# Patient Record
Sex: Male | Born: 1935 | Race: White | Hispanic: No | Marital: Married | Smoking: Former smoker
Health system: Southern US, Community
[De-identification: ages and names within clinical notes are randomized; demographics above are authoritative.]

## PROBLEM LIST (undated history)

## (undated) DIAGNOSIS — E663 Overweight: Secondary | ICD-10-CM

## (undated) DIAGNOSIS — I10 Essential (primary) hypertension: Secondary | ICD-10-CM

## (undated) DIAGNOSIS — R609 Edema, unspecified: Secondary | ICD-10-CM

## (undated) DIAGNOSIS — K449 Diaphragmatic hernia without obstruction or gangrene: Secondary | ICD-10-CM

## (undated) DIAGNOSIS — J449 Chronic obstructive pulmonary disease, unspecified: Secondary | ICD-10-CM

## (undated) DIAGNOSIS — C801 Malignant (primary) neoplasm, unspecified: Secondary | ICD-10-CM

## (undated) DIAGNOSIS — Z9989 Dependence on other enabling machines and devices: Secondary | ICD-10-CM

## (undated) DIAGNOSIS — G4733 Obstructive sleep apnea (adult) (pediatric): Secondary | ICD-10-CM

## (undated) DIAGNOSIS — I82409 Acute embolism and thrombosis of unspecified deep veins of unspecified lower extremity: Secondary | ICD-10-CM

## (undated) DIAGNOSIS — J4489 Other specified chronic obstructive pulmonary disease: Secondary | ICD-10-CM

## (undated) DIAGNOSIS — D751 Secondary polycythemia: Secondary | ICD-10-CM

## (undated) DIAGNOSIS — D126 Benign neoplasm of colon, unspecified: Secondary | ICD-10-CM

## (undated) DIAGNOSIS — Z87891 Personal history of nicotine dependence: Secondary | ICD-10-CM

## (undated) DIAGNOSIS — I739 Peripheral vascular disease, unspecified: Secondary | ICD-10-CM

## (undated) DIAGNOSIS — M1990 Unspecified osteoarthritis, unspecified site: Secondary | ICD-10-CM

## (undated) DIAGNOSIS — N289 Disorder of kidney and ureter, unspecified: Secondary | ICD-10-CM

## (undated) DIAGNOSIS — Z9981 Dependence on supplemental oxygen: Secondary | ICD-10-CM

## (undated) DIAGNOSIS — E785 Hyperlipidemia, unspecified: Secondary | ICD-10-CM

## (undated) DIAGNOSIS — K5730 Diverticulosis of large intestine without perforation or abscess without bleeding: Secondary | ICD-10-CM

## (undated) DIAGNOSIS — I2699 Other pulmonary embolism without acute cor pulmonale: Secondary | ICD-10-CM

## (undated) DIAGNOSIS — C689 Malignant neoplasm of urinary organ, unspecified: Secondary | ICD-10-CM

## (undated) DIAGNOSIS — I679 Cerebrovascular disease, unspecified: Secondary | ICD-10-CM

## (undated) HISTORY — DX: Diverticulosis of large intestine without perforation or abscess without bleeding: K57.30

## (undated) HISTORY — DX: Disorder of kidney and ureter, unspecified: N28.9

## (undated) HISTORY — DX: Other specified chronic obstructive pulmonary disease: J44.89

## (undated) HISTORY — DX: Diaphragmatic hernia without obstruction or gangrene: K44.9

## (undated) HISTORY — PX: HERNIA REPAIR: SHX51

## (undated) HISTORY — DX: Chronic obstructive pulmonary disease, unspecified: J44.9

## (undated) HISTORY — DX: Cerebrovascular disease, unspecified: I67.9

## (undated) HISTORY — DX: Malignant neoplasm of urinary organ, unspecified: C68.9

## (undated) HISTORY — DX: Benign neoplasm of colon, unspecified: D12.6

## (undated) HISTORY — PX: NEPHROURETERECTOMY: SUR876

## (undated) HISTORY — DX: Edema, unspecified: R60.9

## (undated) HISTORY — DX: Hyperlipidemia, unspecified: E78.5

## (undated) HISTORY — DX: Overweight: E66.3

## (undated) HISTORY — DX: Essential (primary) hypertension: I10

## (undated) HISTORY — PX: OTHER SURGICAL HISTORY: SHX169

## (undated) HISTORY — DX: Personal history of nicotine dependence: Z87.891

---

## 1998-10-31 ENCOUNTER — Ambulatory Visit (HOSPITAL_COMMUNITY): Admission: RE | Admit: 1998-10-31 | Discharge: 1998-10-31 | Payer: Self-pay | Admitting: Pulmonary Disease

## 1998-10-31 ENCOUNTER — Encounter: Payer: Self-pay | Admitting: Pulmonary Disease

## 2002-05-17 ENCOUNTER — Ambulatory Visit (HOSPITAL_COMMUNITY): Admission: RE | Admit: 2002-05-17 | Discharge: 2002-05-17 | Payer: Self-pay | Admitting: Pulmonary Disease

## 2002-05-17 ENCOUNTER — Encounter: Payer: Self-pay | Admitting: Pulmonary Disease

## 2002-06-01 ENCOUNTER — Emergency Department (HOSPITAL_COMMUNITY): Admission: EM | Admit: 2002-06-01 | Discharge: 2002-06-01 | Payer: Self-pay

## 2002-06-01 ENCOUNTER — Ambulatory Visit (HOSPITAL_BASED_OUTPATIENT_CLINIC_OR_DEPARTMENT_OTHER): Admission: RE | Admit: 2002-06-01 | Discharge: 2002-06-01 | Payer: Self-pay | Admitting: Urology

## 2002-06-01 ENCOUNTER — Encounter (INDEPENDENT_AMBULATORY_CARE_PROVIDER_SITE_OTHER): Payer: Self-pay | Admitting: Specialist

## 2002-06-02 ENCOUNTER — Emergency Department (HOSPITAL_COMMUNITY): Admission: EM | Admit: 2002-06-02 | Discharge: 2002-06-02 | Payer: Self-pay | Admitting: *Deleted

## 2002-06-06 ENCOUNTER — Encounter: Admission: RE | Admit: 2002-06-06 | Discharge: 2002-06-06 | Payer: Self-pay | Admitting: Urology

## 2002-06-06 ENCOUNTER — Encounter: Payer: Self-pay | Admitting: Urology

## 2002-06-22 ENCOUNTER — Emergency Department (HOSPITAL_COMMUNITY): Admission: EM | Admit: 2002-06-22 | Discharge: 2002-06-22 | Payer: Self-pay | Admitting: Emergency Medicine

## 2002-06-22 ENCOUNTER — Encounter: Payer: Self-pay | Admitting: Emergency Medicine

## 2002-07-02 ENCOUNTER — Inpatient Hospital Stay (HOSPITAL_COMMUNITY): Admission: RE | Admit: 2002-07-02 | Discharge: 2002-07-06 | Payer: Self-pay | Admitting: Urology

## 2002-07-02 ENCOUNTER — Encounter (INDEPENDENT_AMBULATORY_CARE_PROVIDER_SITE_OTHER): Payer: Self-pay | Admitting: Specialist

## 2002-07-03 ENCOUNTER — Encounter: Payer: Self-pay | Admitting: Urology

## 2003-04-05 ENCOUNTER — Inpatient Hospital Stay (HOSPITAL_COMMUNITY): Admission: RE | Admit: 2003-04-05 | Discharge: 2003-04-08 | Payer: Self-pay | Admitting: General Surgery

## 2004-05-28 ENCOUNTER — Ambulatory Visit (HOSPITAL_COMMUNITY): Admission: RE | Admit: 2004-05-28 | Discharge: 2004-05-28 | Payer: Self-pay | Admitting: Pulmonary Disease

## 2004-08-12 ENCOUNTER — Ambulatory Visit: Payer: Self-pay | Admitting: Pulmonary Disease

## 2004-09-30 ENCOUNTER — Ambulatory Visit: Payer: Self-pay | Admitting: Pulmonary Disease

## 2004-11-25 ENCOUNTER — Ambulatory Visit: Payer: Self-pay | Admitting: Pulmonary Disease

## 2004-12-04 ENCOUNTER — Ambulatory Visit: Payer: Self-pay | Admitting: Pulmonary Disease

## 2004-12-04 ENCOUNTER — Ambulatory Visit (HOSPITAL_BASED_OUTPATIENT_CLINIC_OR_DEPARTMENT_OTHER): Admission: RE | Admit: 2004-12-04 | Discharge: 2004-12-04 | Payer: Self-pay | Admitting: Pulmonary Disease

## 2005-01-13 ENCOUNTER — Ambulatory Visit: Payer: Self-pay | Admitting: Pulmonary Disease

## 2005-01-15 ENCOUNTER — Ambulatory Visit: Payer: Self-pay | Admitting: Pulmonary Disease

## 2005-01-15 ENCOUNTER — Emergency Department (HOSPITAL_COMMUNITY): Admission: EM | Admit: 2005-01-15 | Discharge: 2005-01-15 | Payer: Self-pay | Admitting: Emergency Medicine

## 2005-02-11 ENCOUNTER — Ambulatory Visit: Payer: Self-pay | Admitting: Pulmonary Disease

## 2005-03-12 ENCOUNTER — Ambulatory Visit: Payer: Self-pay | Admitting: Pulmonary Disease

## 2005-03-23 ENCOUNTER — Ambulatory Visit: Payer: Self-pay | Admitting: Pulmonary Disease

## 2005-07-14 ENCOUNTER — Ambulatory Visit: Payer: Self-pay | Admitting: Pulmonary Disease

## 2005-07-31 ENCOUNTER — Emergency Department (HOSPITAL_COMMUNITY): Admission: EM | Admit: 2005-07-31 | Discharge: 2005-07-31 | Payer: Self-pay | Admitting: Emergency Medicine

## 2005-08-02 ENCOUNTER — Ambulatory Visit: Payer: Self-pay | Admitting: Pulmonary Disease

## 2005-08-04 ENCOUNTER — Ambulatory Visit (HOSPITAL_COMMUNITY): Admission: RE | Admit: 2005-08-04 | Discharge: 2005-08-04 | Payer: Self-pay | Admitting: Pulmonary Disease

## 2005-08-30 ENCOUNTER — Ambulatory Visit: Payer: Self-pay | Admitting: Pulmonary Disease

## 2005-09-24 ENCOUNTER — Emergency Department (HOSPITAL_COMMUNITY): Admission: EM | Admit: 2005-09-24 | Discharge: 2005-09-24 | Payer: Self-pay | Admitting: Emergency Medicine

## 2005-09-28 ENCOUNTER — Ambulatory Visit: Payer: Self-pay | Admitting: Pulmonary Disease

## 2005-11-30 ENCOUNTER — Ambulatory Visit: Payer: Self-pay | Admitting: Pulmonary Disease

## 2005-12-27 ENCOUNTER — Ambulatory Visit: Payer: Self-pay | Admitting: Pulmonary Disease

## 2006-02-28 ENCOUNTER — Ambulatory Visit: Payer: Self-pay | Admitting: Gastroenterology

## 2006-04-05 ENCOUNTER — Ambulatory Visit: Payer: Self-pay | Admitting: Pulmonary Disease

## 2006-04-12 ENCOUNTER — Ambulatory Visit: Payer: Self-pay | Admitting: Gastroenterology

## 2006-08-08 ENCOUNTER — Ambulatory Visit: Payer: Self-pay | Admitting: Pulmonary Disease

## 2006-11-14 ENCOUNTER — Ambulatory Visit: Payer: Self-pay | Admitting: Pulmonary Disease

## 2007-03-13 ENCOUNTER — Ambulatory Visit: Payer: Self-pay | Admitting: Pulmonary Disease

## 2007-07-11 ENCOUNTER — Ambulatory Visit: Payer: Self-pay | Admitting: Pulmonary Disease

## 2007-08-25 DIAGNOSIS — J449 Chronic obstructive pulmonary disease, unspecified: Secondary | ICD-10-CM

## 2007-08-25 DIAGNOSIS — K449 Diaphragmatic hernia without obstruction or gangrene: Secondary | ICD-10-CM | POA: Insufficient documentation

## 2007-08-25 DIAGNOSIS — J4489 Other specified chronic obstructive pulmonary disease: Secondary | ICD-10-CM | POA: Insufficient documentation

## 2007-08-25 DIAGNOSIS — M1990 Unspecified osteoarthritis, unspecified site: Secondary | ICD-10-CM | POA: Insufficient documentation

## 2007-08-25 DIAGNOSIS — I10 Essential (primary) hypertension: Secondary | ICD-10-CM | POA: Insufficient documentation

## 2007-08-25 DIAGNOSIS — E785 Hyperlipidemia, unspecified: Secondary | ICD-10-CM | POA: Insufficient documentation

## 2008-01-10 ENCOUNTER — Ambulatory Visit: Payer: Self-pay | Admitting: Pulmonary Disease

## 2008-01-21 LAB — CONVERTED CEMR LAB
ALT: 28 units/L (ref 0–53)
AST: 30 units/L (ref 0–37)
Albumin: 4 g/dL (ref 3.5–5.2)
Alkaline Phosphatase: 56 units/L (ref 39–117)
BUN: 22 mg/dL (ref 6–23)
Basophils Absolute: 0 10*3/uL (ref 0.0–0.1)
Basophils Relative: 0.3 % (ref 0.0–1.0)
Bilirubin, Direct: 0.2 mg/dL (ref 0.0–0.3)
CO2: 29 meq/L (ref 19–32)
Calcium: 9 mg/dL (ref 8.4–10.5)
Chloride: 103 meq/L (ref 96–112)
Cholesterol: 116 mg/dL (ref 0–200)
Creatinine, Ser: 1.4 mg/dL (ref 0.4–1.5)
Eosinophils Absolute: 0.3 10*3/uL (ref 0.0–0.7)
Eosinophils Relative: 4.3 % (ref 0.0–5.0)
GFR calc Af Amer: 63 mL/min
GFR calc non Af Amer: 52 mL/min
Glucose, Bld: 95 mg/dL (ref 70–99)
HCT: 49.5 % (ref 39.0–52.0)
HDL: 35.5 mg/dL — ABNORMAL LOW (ref >39.0)
Hemoglobin: 16.4 g/dL (ref 13.0–17.0)
LDL Cholesterol: 44 mg/dL (ref 0–99)
Lymphocytes Relative: 17.3 % (ref 12.0–46.0)
MCHC: 33.2 g/dL (ref 30.0–36.0)
MCV: 93 fL (ref 78.0–100.0)
Monocytes Absolute: 0.8 10*3/uL (ref 0.1–1.0)
Monocytes Relative: 10.2 % (ref 3.0–12.0)
Neutro Abs: 5.3 10*3/uL (ref 1.4–7.7)
Neutrophils Relative %: 67.9 % (ref 43.0–77.0)
Platelets: 245 10*3/uL (ref 150–400)
Potassium: 4.9 meq/L (ref 3.5–5.1)
RBC: 5.32 M/uL (ref 4.22–5.81)
RDW: 14.2 % (ref 11.5–14.6)
Sodium: 140 meq/L (ref 135–145)
TSH: 2.57 microintl units/mL (ref 0.35–5.50)
Total Bilirubin: 0.9 mg/dL (ref 0.3–1.2)
Total CHOL/HDL Ratio: 3.3
Total Protein: 7.2 g/dL (ref 6.0–8.3)
Triglycerides: 184 mg/dL — ABNORMAL HIGH (ref 0–149)
VLDL: 37 mg/dL (ref 0–40)
WBC: 7.7 10*3/uL (ref 4.5–10.5)

## 2008-01-27 DIAGNOSIS — G4733 Obstructive sleep apnea (adult) (pediatric): Secondary | ICD-10-CM

## 2008-01-27 DIAGNOSIS — I679 Cerebrovascular disease, unspecified: Secondary | ICD-10-CM | POA: Insufficient documentation

## 2008-01-27 DIAGNOSIS — C679 Malignant neoplasm of bladder, unspecified: Secondary | ICD-10-CM | POA: Insufficient documentation

## 2008-01-27 DIAGNOSIS — K5730 Diverticulosis of large intestine without perforation or abscess without bleeding: Secondary | ICD-10-CM | POA: Insufficient documentation

## 2008-01-27 DIAGNOSIS — D126 Benign neoplasm of colon, unspecified: Secondary | ICD-10-CM

## 2008-02-05 ENCOUNTER — Encounter: Payer: Self-pay | Admitting: Pulmonary Disease

## 2008-07-08 ENCOUNTER — Ambulatory Visit: Payer: Self-pay | Admitting: Pulmonary Disease

## 2008-07-08 DIAGNOSIS — E663 Overweight: Secondary | ICD-10-CM | POA: Insufficient documentation

## 2008-12-04 ENCOUNTER — Telehealth: Payer: Self-pay | Admitting: Pulmonary Disease

## 2009-01-06 ENCOUNTER — Ambulatory Visit: Payer: Self-pay | Admitting: Pulmonary Disease

## 2009-02-12 LAB — CONVERTED CEMR LAB: Vit D, 25-Hydroxy: 36 ng/mL (ref 30–89)

## 2009-07-07 ENCOUNTER — Ambulatory Visit: Payer: Self-pay | Admitting: Pulmonary Disease

## 2009-09-01 ENCOUNTER — Telehealth (INDEPENDENT_AMBULATORY_CARE_PROVIDER_SITE_OTHER): Payer: Self-pay | Admitting: *Deleted

## 2010-01-05 ENCOUNTER — Ambulatory Visit: Payer: Self-pay | Admitting: Pulmonary Disease

## 2010-01-11 LAB — CONVERTED CEMR LAB
ALT: 28 units/L (ref 0–53)
AST: 25 units/L (ref 0–37)
Albumin: 4 g/dL (ref 3.5–5.2)
Alkaline Phosphatase: 50 units/L (ref 39–117)
BUN: 19 mg/dL (ref 6–23)
Basophils Absolute: 0 10*3/uL (ref 0.0–0.1)
Basophils Relative: 0.3 % (ref 0.0–3.0)
Bilirubin, Direct: 0.1 mg/dL (ref 0.0–0.3)
CO2: 32 meq/L (ref 19–32)
Calcium: 9.1 mg/dL (ref 8.4–10.5)
Chloride: 104 meq/L (ref 96–112)
Cholesterol: 149 mg/dL (ref 0–200)
Creatinine, Ser: 1.3 mg/dL (ref 0.4–1.5)
Direct LDL: 82.6 mg/dL
Eosinophils Absolute: 0.4 10*3/uL (ref 0.0–0.7)
Eosinophils Relative: 4.5 % (ref 0.0–5.0)
GFR calc non Af Amer: 56.22 mL/min (ref >60)
Glucose, Bld: 91 mg/dL (ref 70–99)
HCT: 51.5 % (ref 39.0–52.0)
HDL: 41.6 mg/dL (ref >39.00)
Hemoglobin: 17.2 g/dL — ABNORMAL HIGH (ref 13.0–17.0)
Lymphocytes Relative: 15.3 % (ref 12.0–46.0)
Lymphs Abs: 1.2 10*3/uL (ref 0.7–4.0)
MCHC: 33.4 g/dL (ref 30.0–36.0)
MCV: 93.2 fL (ref 78.0–100.0)
Monocytes Absolute: 0.8 10*3/uL (ref 0.1–1.0)
Monocytes Relative: 10.4 % (ref 3.0–12.0)
Neutro Abs: 5.6 10*3/uL (ref 1.4–7.7)
Neutrophils Relative %: 69.5 % (ref 43.0–77.0)
Platelets: 233 10*3/uL (ref 150.0–400.0)
Potassium: 4.3 meq/L (ref 3.5–5.1)
RBC: 5.53 M/uL (ref 4.22–5.81)
RDW: 14.5 % (ref 11.5–14.6)
Sodium: 144 meq/L (ref 135–145)
TSH: 3.19 microintl units/mL (ref 0.35–5.50)
Total Bilirubin: 0.9 mg/dL (ref 0.3–1.2)
Total CHOL/HDL Ratio: 4
Total Protein: 7.3 g/dL (ref 6.0–8.3)
Triglycerides: 203 mg/dL — ABNORMAL HIGH (ref 0.0–149.0)
VLDL: 40.6 mg/dL — ABNORMAL HIGH (ref 0.0–40.0)
WBC: 8.1 10*3/uL (ref 4.5–10.5)

## 2010-04-14 ENCOUNTER — Telehealth (INDEPENDENT_AMBULATORY_CARE_PROVIDER_SITE_OTHER): Payer: Self-pay | Admitting: *Deleted

## 2010-07-06 ENCOUNTER — Ambulatory Visit: Payer: Self-pay | Admitting: Pulmonary Disease

## 2010-10-11 ENCOUNTER — Emergency Department (HOSPITAL_BASED_OUTPATIENT_CLINIC_OR_DEPARTMENT_OTHER)
Admission: EM | Admit: 2010-10-11 | Discharge: 2010-10-11 | Payer: Self-pay | Source: Home / Self Care | Admitting: Emergency Medicine

## 2010-10-12 LAB — BASIC METABOLIC PANEL
BUN: 26 mg/dL — ABNORMAL HIGH (ref 6–23)
CO2: 23 mEq/L (ref 19–32)
Calcium: 9 mg/dL (ref 8.4–10.5)
Chloride: 105 mEq/L (ref 96–112)
Creatinine, Ser: 1.3 mg/dL (ref 0.4–1.5)
GFR calc Af Amer: >60 mL/min (ref >60)
GFR calc non Af Amer: 53 mL/min — ABNORMAL LOW (ref >60)
Glucose, Bld: 129 mg/dL — ABNORMAL HIGH (ref 70–99)
Potassium: 4.5 mEq/L (ref 3.5–5.1)
Sodium: 141 mEq/L (ref 135–145)

## 2010-10-12 LAB — DIFFERENTIAL
Basophils Absolute: 0 10*3/uL (ref 0.0–0.1)
Basophils Relative: 0 % (ref 0–1)
Eosinophils Absolute: 0.3 10*3/uL (ref 0.0–0.7)
Eosinophils Relative: 3 % (ref 0–5)
Lymphocytes Relative: 12 % (ref 12–46)
Lymphs Abs: 1.3 10*3/uL (ref 0.7–4.0)
Monocytes Absolute: 0.9 10*3/uL (ref 0.1–1.0)
Monocytes Relative: 9 % (ref 3–12)
Neutro Abs: 8.3 10*3/uL — ABNORMAL HIGH (ref 1.7–7.7)
Neutrophils Relative %: 76 % (ref 43–77)

## 2010-10-12 LAB — CBC
HCT: 49.6 % (ref 39.0–52.0)
Hemoglobin: 16.8 g/dL (ref 13.0–17.0)
MCH: 30.5 pg (ref 26.0–34.0)
MCHC: 33.9 g/dL (ref 30.0–36.0)
MCV: 90 fL (ref 78.0–100.0)
Platelets: 195 10*3/uL (ref 150–400)
RBC: 5.51 MIL/uL (ref 4.22–5.81)
RDW: 13.9 % (ref 11.5–15.5)
WBC: 10.9 10*3/uL — ABNORMAL HIGH (ref 4.0–10.5)

## 2010-10-25 LAB — CONVERTED CEMR LAB
ALT: 22 units/L (ref 0–53)
AST: 21 units/L (ref 0–37)
Albumin: 3.8 g/dL (ref 3.5–5.2)
Alkaline Phosphatase: 44 units/L (ref 39–117)
BUN: 22 mg/dL (ref 6–23)
Basophils Absolute: 0 10*3/uL (ref 0.0–0.1)
Basophils Relative: 0.3 % (ref 0.0–3.0)
Bilirubin, Direct: 0.2 mg/dL (ref 0.0–0.3)
CO2: 30 meq/L (ref 19–32)
Calcium: 8.9 mg/dL (ref 8.4–10.5)
Chloride: 107 meq/L (ref 96–112)
Cholesterol: 136 mg/dL (ref 0–200)
Creatinine, Ser: 1.3 mg/dL (ref 0.4–1.5)
Direct LDL: 64.8 mg/dL
Eosinophils Absolute: 0.3 10*3/uL (ref 0.0–0.7)
Eosinophils Relative: 3.9 % (ref 0.0–5.0)
GFR calc non Af Amer: 56.36 mL/min (ref >60)
Glucose, Bld: 96 mg/dL (ref 70–99)
HCT: 46.5 % (ref 39.0–52.0)
HDL: 36.1 mg/dL — ABNORMAL LOW (ref >39.00)
Hemoglobin: 16 g/dL (ref 13.0–17.0)
Lymphocytes Relative: 15.5 % (ref 12.0–46.0)
Lymphs Abs: 1.3 10*3/uL (ref 0.7–4.0)
MCHC: 34.3 g/dL (ref 30.0–36.0)
MCV: 91.7 fL (ref 78.0–100.0)
Monocytes Absolute: 0.9 10*3/uL (ref 0.1–1.0)
Monocytes Relative: 10.5 % (ref 3.0–12.0)
Neutro Abs: 5.7 10*3/uL (ref 1.4–7.7)
Neutrophils Relative %: 69.8 % (ref 43.0–77.0)
PSA: 1.31 ng/mL (ref 0.10–4.00)
Platelets: 206 10*3/uL (ref 150.0–400.0)
Potassium: 4.6 meq/L (ref 3.5–5.1)
RBC: 5.07 M/uL (ref 4.22–5.81)
RDW: 13.1 % (ref 11.5–14.6)
Sodium: 142 meq/L (ref 135–145)
TSH: 2.08 microintl units/mL (ref 0.35–5.50)
Total Bilirubin: 1 mg/dL (ref 0.3–1.2)
Total CHOL/HDL Ratio: 4
Total Protein: 6.8 g/dL (ref 6.0–8.3)
Triglycerides: 208 mg/dL — ABNORMAL HIGH (ref 0.0–149.0)
VLDL: 41.6 mg/dL — ABNORMAL HIGH (ref 0.0–40.0)
WBC: 8.2 10*3/uL (ref 4.5–10.5)

## 2010-10-27 NOTE — Progress Notes (Signed)
Summary: oral surgery> hold plavix x 3 days before and 1 after  Phone Note Call from Patient Call back at [facility] Phone [phone]   Caller: Patient Call For: nadel Summary of Call: pt having tooth pulled next tues. should he stop taking plavix? he got no answer from the dentist re: this.  Initial call taken by: Tivis Ringer, CNA,  April 14, 2010 9:11 AM  Follow-up for Phone Call        Tammy, please advise if pt should hold plavix prior to having 2 teeth extracted on 7/26/, thanks Vernie Murders  April 14, 2010 9:14 AM Hold x 3 days then restart the day after if not bleeding   Follow-up by: Nyoka Cowden MD,  April 14, 2010 4:54 PM  Additional Follow-up for Phone Call Additional follow up Details #1::        called and spoke with pt.  pt aware of MW's recs.  pt verbalized understanding and denied any questions.  Arman Filter LPN  April 14, 2010 4:58 PM

## 2010-10-27 NOTE — Assessment & Plan Note (Signed)
Summary: 6 months/apc   CC:  6 month ROV & review of mult medical problems....  History of Present Illness: 77 y/o [name] here for a follow up visit... he has mult med issues as noted below...    ~  Apr10:  notes some slight incr SOB & cough w/ beige sputum... c/o some vague intermittent left CWP at the costal margin, hasn't required meds for this... he tells me he is exercising 3d/wk and golfing 2d/wk... he had a follow up by DrOttelin 3/10 and was told everything was OK- neg cysto and we will check his PSA.   ~  July 07, 2009:  6 mo follow up doing reasonably well- no new complaints or concerns... states that he's golfing 2x per week (w/ his O2), on diet & weight down 9# to 196# today... he wi=ould like the Flu shot today...   ~  January 05, 2010:  he's had a good 6 months- no resp exac etc... exerc by walking, playing golf, etc... no new complaints or concerns... BP controlled on Micardis;  Chol looking good on diet + Simva40;  no angina or TIA manifestations on the daily Plavix... he saw DrOttelin  ~12/10 (we don't have note) & pt tells me doing well, PSA was "OK", and "he released me for 27yr"... today needs CXR, fasting labs, TDAP.    Current Problem List:  OBSTRUCTIVE SLEEP APNEA (ICD-327.23) - on CPAP Qhs & using it regularly, doing well... sleep study 3/06 w/ RDI 26 & mod snoring w/ desat to 76%...   COPD (ICD-496) - he is on home O2- & using it more regularly now...  on ADVAIR 250Bid, SPIRIVA 1/d, MUCINEX 1Bid... min cough & beige phlegm, no recent infections etc... chr stable DOE w/o change.  ~  baseline CXR shows COPD, NAD...  ~  PFT 11/04 showed FVC=4.51 (103%), FEV1=1.93 (67%), %1sec=43, mid-flows=18%... LV's w/ some air trapping and DLCO ~50%... note office spirometry 11/05 w/ FEV1=1.Tracy (48%)...  ~  CT Chest 9/05 showed marked emphysema w/ blebs, & thor spondylosis...  ~  PFT 4/10 showed FVC= 3.00 (77%), FEV1= 1.37 (45%), %1sec= 46, mid-flows= 16% pred...  ~  f/u CXR 4/10 showed  COPD/ emphysema, NAD.Marland KitchenMarland Kitchen  ~  yearly f/u CXR 4/11 showed COPD/ E, calcif Thor Ao, prom pulm arts, DJD in spine...  HYPERTENSION, BORDERLINE (ICD-401.9) - on MICARDIS/Hct 40-12.5 daily... BP= 122/80 and his weight is stable at 200#... discussed diet + exerc, keep up the good work... denies HA, fatigue, visual changes, CP, palipit, dizziness, syncope, edema, etc...  ~  labs 4/10 showed BUN= 22, Creat= 1.3, K= 4.6  ~  labs 4/11 showed BUN= 19, Creat= 1.3, K= 4.3  CEREBROVASCULAR DISEASE (ICD-437.9) - takes PLAVIX 75mg /d & refuses to take ASA 81mg  due to some minor bruising from thin skin- we discussed this and I rec (again) that he take the ASA daily... he denies weakness, numbness, stroke symptoms, etc...  ~  MRI Br 11/06 shows mod atrophy, extensive chr microvasc ischemia... 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  HYPERLIPIDEMIA (ICD-272.4) - on SIMVASTATIN 40mg /d + Fish Oil...  ~  pre-med FLP's showed TChol ~250, TG ~300, HDL ~33, LDL ~150  ~  FLP 03/47 on [id number] showed TChol 129, TG 161, HDL 37, LDL 60  ~  FLP 4/09 on Vytor10-40 showed TChol 116, TG 184, HDL 36, LDL 44...rec- continue same.  ~  [facility] required change to Simvastatin 2010 & started on 40mg /d.  ~  FLP 4/10 showed  TChol 136, TG 208, HDL 36, LDL 65... rec> may need fibarte, get wt down first!  ~  FLP 4/11 showed TChol 149, TG 203, HDL 41, LDL 83  OVERWEIGHT (ICD-278.02) - weight stable at 200#... discussed diet + exercise program...  HIATAL HERNIA (ICD-553.3) - he denies reflux symptoms, trouble swallowing, etc...  DIVERTICULOSIS OF COLON (ICD-562.10) - he uses Metamucil daily & doing well. COLONIC POLYPS (ICD-211.3) - last colonoscopy 7/07 by DrSam showed divertics, hems, polyps...  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 he  continues regular GU follow up & saw DrOttelin 9/09 w/ neg cysto & PSA per pt...  ~  labs 4/09 showed BUN= 22, Creat= 1.4, PSA= per DrOttelin...  ~  labs 4/10 showed BUN= 22, Creat= 1.3, K= 4.6  ~  4/11:  he reports last f/u DrOttelin in Fall2010- doing satis, PSA "OK" per pt & f/u planned yearly.  DEGENERATIVE JOINT DISEASE (ICD-715.90) - he uses tylenol Prn...  ~  labs 4/10 showed Vit D level = 36... rec> 1000 u/d OTC  Health Maintenance:  ~  colonoscopy 7/07 by DrSamLeB showed divertics, hems, +polyps  ~  GU: followed by DrOttelin & last seen 2 weeks ago- neg cysto & f/u planned 44yr... PSA 4/10 = 1.31  ~  Immunizations: had PNEUMOVAX in 68 @ age 66... given 2010 Flu vaccine 07/07/09... OK TDAP 4/11.   Allergies: 1)  Sulfamethoxazole (Sulfamethoxazole)  Comments:  Nurse/Medical Assistant: The patient's medications and allergies were reviewed with the patient and were updated in the Medication and Allergy Lists.  Past History:  Past Medical History:  OBSTRUCTIVE SLEEP APNEA (ICD-327.23) COPD (ICD-496) HYPERTENSION, BORDERLINE (ICD-401.9) CEREBROVASCULAR DISEASE (ICD-437.9) HYPERLIPIDEMIA (ICD-272.4) OVERWEIGHT (ICD-278.02) HIATAL HERNIA (ICD-553.3) DIVERTICULOSIS OF COLON (ICD-562.10) COLONIC POLYPS (ICD-211.3) MALIGNANT NEOPLASM OF BLADDER PART UNSPECIFIED (ICD-188.9) DEGENERATIVE JOINT DISEASE (ICD-715.90)  Past Surgical History:  S/P left nephroureterectomy for TCCa 10/03 by drOttelin S/P TUR-BT in 1983 - no recurrence on surveillence cystos until 2006      recurrent bladder tumor 2006- fulgerated & doing well since then. S/P incisional hernia repair 7/04 by Clyde Lundborg  Family History: Reviewed history and no changes required.  Social History: Reviewed history from 01/06/2009 and no changes required. Former Smoker---quit in the mid 1980's--smoked 1-1 1/2 ppd  Review of Systems      See HPI       The patient complains of dyspnea on exertion.  The patient  denies anorexia, fever, weight loss, weight gain, vision loss, decreased hearing, hoarseness, chest pain, syncope, peripheral edema, prolonged cough, headaches, hemoptysis, abdominal pain, melena, hematochezia, severe indigestion/heartburn, hematuria, incontinence, muscle weakness, suspicious skin lesions, transient blindness, difficulty walking, depression, unusual weight change, abnormal bleeding, enlarged lymph nodes, and angioedema.    Vital Signs:  Patient profile:   77 year old male Height:      70 inches Weight:      200.25 pounds BMI:     28.84 O2 Sat:      91 % on Room air Temp:     98.4 degrees F oral Pulse rate:   55 / minute BP sitting:   122 / 80  (left arm) Cuff size:   regular  Vitals Entered By: Randell Loop CMA (January 05, 2010 8:49 AM)  O2 Sat at Rest %:  91 O2 Flow:  Room air CC: 6 month  ROV & review of mult medical problems... Is Patient Diabetic? No Pain Assessment Patient in pain? no      Comments no changes in meds   Physical Exam  Additional Exam:  WD, Overweight, 77 y/o [name] in NAD... GENERAL:  Alert & oriented; pleasant & cooperative... HEENT:  [place]/AT, EOM-wnl, PERRLA, EACs-clear, TMs-wnl, NOSE-clear, THROAT-clear & wnl. NECK:  Supple w/ fairROM; no JVD; normal carotid impulses w/o bruits; no thyromegaly or nodules palpated; no lymphadenopathy. CHEST:  Decr BS bilat, clear to P & A; without wheezes/ rales/ or rhonchi heard... HEART:  Regular Rhythm; distant HS, without murmurs/ rubs/ or gallops detected... ABDOMEN:  Soft & nontender; normal bowel sounds; no organomegaly or masses palpated... EXT: without deformities, mild arthritic changes; no varicose veins/ +venous insuffic/ no edema. NEURO:  CN's intact;  no focal neuro deficits... DERM:  No lesions noted; no rash x few ecchymoses...    CXR  Procedure date:  01/05/2010  Findings:      CHEST - 2 VIEW Comparison: 01/06/2009   Findings: Trachea is midline.  Heart size normal.  Thoracic  aorta is calcified.  Pulmonary arteries are prominent.  There are changes of emphysema.  Lungs are otherwise clear.  Degenerative changes are seen in the spine.   IMPRESSION:   1.  Emphysema without acute finding. 2.  Pulmonary arterial hypertension.   Read By:  Reyes Ivan.,  M.D.   MISC. Report  Procedure date:  01/05/2010  Findings:      BMP (METABOL)   Sodium                    144 mEq/L                   135-145   Potassium                 4.3 mEq/L                   3.5-5.1   Chloride                  104 mEq/L                   96-112   Carbon Dioxide            32 mEq/L                    19-32   Glucose                   91 mg/dL                    16-10   BUN                       19 mg/dL                    9-60   Creatinine                1.3 mg/dL                   4.5-4.0   Calcium                   9.1 mg/dL                   9.8-11.9   GFR  56.22 mL/min                >60  Hepatic/Liver Function Panel (HEPATIC)   Total Bilirubin           0.9 mg/dL                   1.6-1.0   Direct Bilirubin          0.1 mg/dL                   9.6-0.4   Alkaline Phosphatase      50 U/L                      39-117   AST                       25 U/L                      0-37   ALT                       28 U/L                      0-53   Total Protein             7.3 g/dL                    5.4-0.9   Albumin                   4.0 g/dL                    8.1-1.9  CBC Platelet w/Diff (CBCD)   White Cell Count          8.1 K/uL                    4.5-10.5   Red Cell Count            5.53 Mil/uL                 4.22-5.81   Hemoglobin           [H]  17.2 g/dL                   14.7-82.9   Hematocrit                51.5 %                      39.0-52.0   MCV                       93.2 fl                     78.0-100.0   Platelet Count            233.0 K/uL                  150.0-400.0   Neutrophil %              69.5 %                      43.0-77.0    Lymphocyte %  15.3 %                      12.0-46.0   Monocyte %                10.4 %                      3.0-12.0   Eosinophils%              4.5 %                       0.0-5.0   Basophils %               0.3 %                       0.0-3.0  Comments:      TSH (TSH)   FastTSH                   3.19 uIU/mL                 0.35-5.50   Lipid Panel (LIPID)   Cholesterol               149 mg/dL                   1-610   Triglycerides        [H]  203.0 mg/dL                 9.6-045.4   HDL                       09.81 mg/dL                 >19.14  Cholesterol LDL - Direct                             82.6 mg/dl   Impression & Recommendations:  Problem # 1:  COPD (ICD-496) Stable w/ severe COPD... continue same meds & Oxygen... continue exercise program. His updated medication list for this problem includes:    Advair Diskus 250-50 Mcg/dose Misc (Fluticasone-salmeterol) .Marland Kitchen... Take one puff two times a day, rinse mouth    Spiriva Handihaler 18 Mcg Caps (Tiotropium bromide monohydrate) ..... Inhale one capsule daily  Orders: T-2 View CXR (71020TC)  Problem # 2:  OBSTRUCTIVE SLEEP APNEA (ICD-327.23) Stable on CPAP-  continue same.  Problem # 3:  HYPERTENSION, BORDERLINE (ICD-401.9) Controlled-  same Micardis/ HCT... His updated medication list for this problem includes:    Micardis Hct 40-12.5 Mg Tabs (Telmisartan-hctz) .Marland Kitchen... Take one tablet by mouth at bedtime  Orders: TLB-BMP (Basic Metabolic Panel-BMET) (80048-METABOL) TLB-Hepatic/Liver Function Pnl (80076-HEPATIC) TLB-CBC Platelet - w/Differential (85025-CBCD) TLB-TSH (Thyroid Stimulating Hormone) (84443-TSH) TLB-Lipid Panel (80061-LIPID)  Problem # 4:  CEREBROVASCULAR DISEASE (ICD-437.9) Stable on Plavix...  Problem # 5:  HYPERLIPIDEMIA (ICD-272.4) FLP on Simva40 showed similar numbers... needs better low fat diet, get wt down. His updated medication list for this problem includes:    Simvastatin 40 Mg Tabs  (Simvastatin) .Marland Kitchen... Take one tablet by mouth at bedtime  Problem # 6:  OVERWEIGHT (ICD-278.02) We discussed diet + exercise-  he needs to decr caloric intake...  Problem # 7:  COLONIC POLYPS (ICD-211.3) GI is stable and up to date...  Problem # 8:  MALIGNANT  NEOPLASM OF BLADDER PART UNSPECIFIED (ICD-188.9) Followed by DrOttelin-  we don't have note but pt indicates doing well (cysto, PSA) and f/u yearly now...  Problem # 9:  OTHER MEDICAL PROBLEMS AS NOTED>>>  Complete Medication List: 1)  Oxygen  .... Use 2 liters/ min as directed... 2)  Plavix 75 Mg Tabs (Clopidogrel bisulfate) .... Take 1 tab by mouth once daily.Marland KitchenMarland Kitchen 3)  Advair Diskus 250-50 Mcg/dose Misc (Fluticasone-salmeterol) .... Take one puff two times a day, rinse mouth 4)  Spiriva Handihaler 18 Mcg Caps (Tiotropium bromide monohydrate) .... Inhale one capsule daily 5)  Mucinex 600 Mg Tb12 (Guaifenesin) .... Take 1-2 tabs by mouth two times a day w/ fluids.Marland KitchenMarland Kitchen 6)  Micardis Hct 40-12.5 Mg Tabs (Telmisartan-hctz) .... Take one tablet by mouth at bedtime 7)  Simvastatin 40 Mg Tabs (Simvastatin) .... Take one tablet by mouth at bedtime 8)  Fish Oil 1000 Mg Caps (Omega-3 fatty acids) .... Take one tablet two times a day 9)  Metamucil 30.9 % Powd (Psyllium) .... Use once daily 10)  Centrum Silver Tabs (Multiple vitamins-minerals) .... Take 1 tablet by mouth once a day 11)  Vitamin D 1000 Unit Tabs (Cholecalciferol) .... Take 1 tab by mouth once daily...  Other Orders: Tdap => 45yrs IM (16109) Admin 1st Vaccine (60454)  Patient Instructions: 1)  Today we updated your med list- see below.... 2)  Continue your current meds the same... & have your Pharm call us for any needed refills. 3)  Today we gave your the combination Tetanus shot called the TDAP (it is good for 80yrs)... 4)  We also did your follow up CXR & FASTINg blood work... please call the "phone tree" in a few days for your lab results.Marland KitchenMarland Kitchen 5)  Stay as active as  possible... 6)  Call for any problems.Marland KitchenMarland Kitchen 7)  Please schedule a follow-up appointment in 6 months.   Immunizations Administered:  Tetanus Vaccine:    Vaccine Type: Tdap    Site: left deltoid    Mfr: BOOSTRIX    Dose: 0.5 ml    Route: IM    Given by: Randell Loop CMA    Exp. Date: 12/20/2011    Lot #: [id number]    VIS given: 08/15/07 version given January 05, 2010.

## 2010-10-27 NOTE — Assessment & Plan Note (Signed)
Summary: 6 MONTHS/APC   CC:  6 month ROV & review of mult medical problems....  History of Present Illness: 77 y/o WM here for a follow up visit... he has mult med issues as noted below...    ~  Apr10:  notes some slight incr SOB & cough w/ beige sputum... c/o some vague intermittent left CWP at the costal margin, hasn't required meds for this... he tells me he is exercising 3d/wk and golfing 2d/wk... he had a follow up by DrOttelin 3/10 and was told everything was OK- neg cysto and we will check his PSA.   ~  July 07, 2009:  6 mo follow up doing reasonably well- no new complaints or concerns... states that he's golfing 2x per week (w/ his O2), on diet & weight down 9# to 196# today... he wi=ould like the Flu shot today...   ~  January 05, 2010:  he's had a good 6 months- no resp exac etc... exerc by walking, playing golf, etc... no new complaints or concerns... BP controlled on Micardis;  Chol looking good on diet + Simva40;  no angina or TIA manifestations on the daily Plavix... he saw DrOttelin  ~12/10 (we don't have note) & pt tells me doing well, PSA was "OK", and "he released me for 47yr"... today needs CXR, fasting labs, TDAP.   ~  July 06, 2010:  he reports a good 13mo- he had his yearly f/u DrOttelin w/ cysto neg & doing well by his hx... denies resp exac;  BP remains controlled on meds;  no cerebral ischemic symptoms;  he is not fasting today & labs from 4/11 reviewed & looked good... OK Flu shot today...    Current Problem List:  OBSTRUCTIVE SLEEP APNEA (ICD-327.23) - on CPAP Qhs & using it regularly, doing well... sleep study 3/06 w/ RDI 26 & mod snoring w/ desat to 76%...   COPD (ICD-496) - he is on home O2- & using it more regularly now...  on ADVAIR 250Bid, SPIRIVA 1/d, MUCINEX 1Bid... min cough & beige phlegm, no recent infections etc... chr stable DOE w/o change.  ~  baseline CXR shows COPD, NAD...  ~  PFT 11/04 showed FVC=4.51 (103%), FEV1=1.93 (67%), %1sec=43,  mid-flows=18%... LV's w/ some air trapping and DLCO ~50%... note office spirometry 11/05 w/ FEV1=1.57 (48%)...  ~  CT Chest 9/05 showed marked emphysema w/ blebs, & thor spondylosis...  ~  PFT 4/10 showed FVC= 3.00 (77%), FEV1= 1.37 (45%), %1sec= 46, mid-flows= 16% pred...  ~  f/u CXR 4/10 showed COPD/ emphysema, NAD.Marland KitchenMarland Kitchen  ~  yearly f/u CXR 4/11 showed COPD/ E, calcif Thor Ao, prom pulm arts, DJD in spine...  HYPERTENSION, BORDERLINE (ICD-401.9) - on MICARDIS/Hct 40-12.5 daily... BP= 122/80 and his weight is stable at 200#... discussed diet + exerc, keep up the good work... denies HA, fatigue, visual changes, CP, palipit, dizziness, syncope, edema, etc...  ~  labs 4/10 showed BUN= 22, Creat= 1.3, K= 4.6  ~  labs 4/11 showed BUN= 19, Creat= 1.3, K= 4.3  CEREBROVASCULAR DISEASE (ICD-437.9) - takes PLAVIX 75mg /d & refuses to take ASA 81mg  due to some minor bruising from thin skin- we discussed this and I rec (again) that he take the ASA daily... he denies weakness, numbness, stroke symptoms, etc...  ~  MRI Br 11/06 shows mod atrophy, extensive chr microvasc ischemia... 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  HYPERLIPIDEMIA (ICD-272.4) - on SIMVASTATIN 40mg /d + Fish Oil...  ~  pre-med FLP's showed TChol ~  250, TG ~300, HDL ~33, LDL ~150  ~  FLP 16/10 on RUEAV40-98 showed TChol 129, TG 161, HDL 37, LDL 60  ~  FLP 4/09 on Vytor10-40 showed TChol 116, TG 184, HDL 36, LDL 44...rec- continue same.  ~  [facility] required change to Simvastatin 2010 & started on 40mg /d.  ~  FLP 4/10 showed TChol 136, TG 208, HDL 36, LDL 65... rec> may need fibarte, get wt down first!  ~  FLP 4/11 showed TChol 149, TG 203, HDL 41, LDL 83  OVERWEIGHT (ICD-278.02) - weight stable at  ~200#... discussed diet + exercise program...  HIATAL HERNIA (ICD-553.3) - he denies reflux symptoms, trouble swallowing, etc...  DIVERTICULOSIS OF COLON (ICD-562.10) - he uses Metamucil daily & doing well. COLONIC POLYPS (ICD-211.3) -  last colonoscopy 7/07 by DrSam showed divertics, hems, polyps...  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 he continues regular GU follow up w/ DrOttelin yearly in the fall...  ~  labs 4/09 showed BUN= 22, Creat= 1.4, PSA's= per DrOttelin...  ~  labs 4/10 showed BUN= 22, Creat= 1.3, K= 4.6  ~  labs 4/11 showed BUN= 19, Creat= 1.3  DEGENERATIVE JOINT DISEASE (ICD-715.90) - he uses tylenol Prn...  ~  labs 4/10 showed Vit D level = 36... rec> 1000 u/d OTC  Health Maintenance:  ~  colonoscopy 7/07 by DrSamLeB showed divertics, hems, +polyps  ~  GU: followed by DrOttelin & last seen 2 weeks ago- neg cysto & f/u planned 75yr... PSA 4/10 = 1.31  ~  Immunizations: had PNEUMOVAX in 45 @ age 77... OK TDAP 4/11... he gets yearly Flu vaccine each fall.   Allergies: 1)  Sulfamethoxazole (Sulfamethoxazole)  Comments:  Nurse/Medical Assistant: The patient's medications and allergies were reviewed with the patient and were updated in the Medication and Allergy Lists.  Past History:  Past Medical History: OBSTRUCTIVE SLEEP APNEA (ICD-327.23) COPD (ICD-496) HYPERTENSION, BORDERLINE (ICD-401.9) CEREBROVASCULAR DISEASE (ICD-437.9) HYPERLIPIDEMIA (ICD-272.4) OVERWEIGHT (ICD-278.02) HIATAL HERNIA (ICD-553.3) DIVERTICULOSIS OF COLON (ICD-562.10) COLONIC POLYPS (ICD-211.3) MALIGNANT NEOPLASM OF BLADDER PART UNSPECIFIED (ICD-188.9) DEGENERATIVE JOINT DISEASE (ICD-715.90)  Past Surgical History: S/P left nephroureterectomy for TCCa 10/03 by drOttelin S/P TUR-BT in 1983 - no recurrence on surveillence cystos until 2006      recurrent bladder tumor 2006- fulgerated & doing well since then. S/P incisional hernia repair 7/04 by Clyde Lundborg  Family History: Reviewed history and no changes required.  Social  History: Reviewed history from 01/06/2009 and no changes required. Former Smoker---quit in the mid 1980's--smoked 1-1 1/2 ppd  Review of Systems      See HPI       The patient complains of decreased hearing and dyspnea on exertion.  The patient denies anorexia, fever, weight loss, weight gain, vision loss, hoarseness, chest pain, syncope, peripheral edema, prolonged cough, headaches, hemoptysis, abdominal pain, melena, hematochezia, severe indigestion/heartburn, hematuria, incontinence, muscle weakness, suspicious skin lesions, transient blindness, difficulty walking, depression, unusual weight change, abnormal bleeding, enlarged lymph nodes, and angioedema.    Vital Signs:  Patient profile:   77 year old male Height:      70 inches Weight:      196.38 pounds BMI:     28.28 O2 Sat:      95 % on 3 L/min Temp:     97.9 degrees F oral Pulse rate:   51 /  minute BP sitting:   118 / 70  (left arm) Cuff size:   regular  Vitals Entered By: Randell Loop CMA (July 06, 2010 9:18 AM)  O2 Sat at Rest %:  95 O2 Flow:  3 L/min  O2 Sat Comments upon entering room pts oxygen was 83% on room air--pt placed his oxygen back on and oxygen up to 95%  Physical Exam  Additional Exam:  WD, Overweight, 77 y/o WM in NAD... GENERAL:  Alert & oriented; pleasant & cooperative... HEENT:  [place]/AT, EOM-wnl, PERRLA, EACs-clear, TMs-wnl, NOSE-clear, THROAT-clear & wnl. NECK:  Supple w/ fairROM; no JVD; normal carotid impulses w/o bruits; no thyromegaly or nodules palpated; no lymphadenopathy. CHEST:  Decr BS bilat, clear to P & A; without wheezes/ rales/ or rhonchi heard... HEART:  Regular Rhythm; distant HS, without murmurs/ rubs/ or gallops detected... ABDOMEN:  Soft & nontender; normal bowel sounds; no organomegaly or masses palpated... EXT: without deformities, mild arthritic changes; no varicose veins/ +venous insuffic/ no edema. NEURO:  CN's intact;  no focal neuro deficits... DERM:  No lesions noted; no  rash x few ecchymoses...    Impression & Recommendations:  Problem # 1:  OBSTRUCTIVE SLEEP APNEA (ICD-327.23) Continue CPAP nightly...  Problem # 2:  COPD (ICD-496) Stable on meds, OK Flu vaccine, avoid infections, etc... His updated medication list for this problem includes:    Advair Diskus 250-50 Mcg/dose Misc (Fluticasone-salmeterol) .Marland Kitchen... Take one puff two times a day, rinse mouth    Spiriva Handihaler 18 Mcg Caps (Tiotropium bromide monohydrate) ..... Inhale one capsule daily  Problem # 3:  HYPERTENSION, BORDERLINE (ICD-401.9) Controlled>  same med. His updated medication list for this problem includes:    Micardis Hct 40-12.5 Mg Tabs (Telmisartan-hctz) .Marland Kitchen... Take one tablet by mouth at bedtime  Problem # 4:  CEREBROVASCULAR DISEASE (ICD-437.9) Continue ASA, Plavix daily...  Problem # 5:  HYPERLIPIDEMIA (ICD-272.4) Stable on the Simva40... His updated medication list for this problem includes:    Simvastatin 40 Mg Tabs (Simvastatin) .Marland Kitchen... Take one tablet by mouth at bedtime  Problem # 6:  HIATAL HERNIA (ICD-553.3) GI is stable>  continue same Rx...  Problem # 7:  MALIGNANT NEOPLASM OF BLADDER PART UNSPECIFIED (ICD-188.9) GU per DrOttelin & pt reports recent f/u was neg...  Problem # 8:  OTHER MEDICAL PROBLEMS AS NOTED>>>  Complete Medication List: 1)  Oxygen  .... Use 2 liters/ min as directed... 2)  Plavix 75 Mg Tabs (Clopidogrel bisulfate) .... Take 1 tab by mouth once daily.Marland KitchenMarland Kitchen 3)  Advair Diskus 250-50 Mcg/dose Misc (Fluticasone-salmeterol) .... Take one puff two times a day, rinse mouth 4)  Spiriva Handihaler 18 Mcg Caps (Tiotropium bromide monohydrate) .... Inhale one capsule daily 5)  Mucinex 600 Mg Tb12 (Guaifenesin) .... Take 1-2 tabs by mouth two times a day w/ fluids.Marland KitchenMarland Kitchen 6)  Micardis Hct 40-12.5 Mg Tabs (Telmisartan-hctz) .... Take one tablet by mouth at bedtime 7)  Simvastatin 40 Mg Tabs (Simvastatin) .... Take one tablet by mouth at bedtime 8)  Fish Oil  1000 Mg Caps (Omega-3 fatty acids) .... Take one tablet two times a day 9)  Metamucil 30.9 % Powd (Psyllium) .... Use once daily 10)  Centrum Silver Tabs (Multiple vitamins-minerals) .... Take 1 tablet by mouth once a day 11)  Vitamin D3 1000 Unit Caps (Cholecalciferol) .... Take 1 cap by mouth once daily...  Other Orders: Flu Vaccine 53yrs + MEDICARE PATIENTS (Z6109) Administration Flu vaccine - MCR (U0454)  Patient Instructions: 1)  Today we updated your  med list- see below.... 2)  Contuinue your current meds the same... 3)  Today we gave you the 2011 Flu vaccine... 4)  Stay as active as possible... 5)  Call for any problems.Marland KitchenMarland Kitchen 6)  Please schedule a follow-up appointment in 6 months, and we will plan FASTING bloo dwork at that time...    Flu Vaccine Consent Questions     Do you have a history of severe allergic reactions to this vaccine? no    Any prior history of allergic reactions to egg and/or gelatin? no    Do you have a sensitivity to the preservative Thimersol? no    Do you have a past history of Guillan-Barre Syndrome? no    Do you currently have an acute febrile illness? no    Have you ever had a severe reaction to latex? no    Vaccine information given and explained to patient? yes    Are you currently pregnant? no    Lot Number:[id number]   Exp Date:03/27/2011   Site Given  Left Deltoid IMdflu Carron Curie CMA  July 06, 2010 10:15 AM

## 2011-01-08 ENCOUNTER — Encounter: Payer: Self-pay | Admitting: Pulmonary Disease

## 2011-01-11 ENCOUNTER — Other Ambulatory Visit (INDEPENDENT_AMBULATORY_CARE_PROVIDER_SITE_OTHER): Payer: Medicare Other

## 2011-01-11 ENCOUNTER — Ambulatory Visit (INDEPENDENT_AMBULATORY_CARE_PROVIDER_SITE_OTHER): Payer: Medicare Other | Admitting: Pulmonary Disease

## 2011-01-11 ENCOUNTER — Ambulatory Visit (INDEPENDENT_AMBULATORY_CARE_PROVIDER_SITE_OTHER)
Admission: RE | Admit: 2011-01-11 | Discharge: 2011-01-11 | Disposition: A | Discharge status: Home / Self Care | Payer: Medicare Other | Source: Ambulatory Visit | Attending: Pulmonary Disease | Admitting: Pulmonary Disease

## 2011-01-11 ENCOUNTER — Encounter: Payer: Self-pay | Admitting: Pulmonary Disease

## 2011-01-11 VITALS — BP 118/76 | HR 54 | Temp 96.6°F | Ht 70.0 in | Wt 199.8 lb

## 2011-01-11 DIAGNOSIS — C679 Malignant neoplasm of bladder, unspecified: Secondary | ICD-10-CM

## 2011-01-11 DIAGNOSIS — J449 Chronic obstructive pulmonary disease, unspecified: Secondary | ICD-10-CM

## 2011-01-11 DIAGNOSIS — G4733 Obstructive sleep apnea (adult) (pediatric): Secondary | ICD-10-CM

## 2011-01-11 DIAGNOSIS — I10 Essential (primary) hypertension: Secondary | ICD-10-CM

## 2011-01-11 DIAGNOSIS — E785 Hyperlipidemia, unspecified: Secondary | ICD-10-CM

## 2011-01-11 DIAGNOSIS — N139 Obstructive and reflux uropathy, unspecified: Secondary | ICD-10-CM | POA: Insufficient documentation

## 2011-01-11 DIAGNOSIS — R531 Weakness: Secondary | ICD-10-CM

## 2011-01-11 DIAGNOSIS — R5381 Other malaise: Secondary | ICD-10-CM

## 2011-01-11 DIAGNOSIS — I679 Cerebrovascular disease, unspecified: Secondary | ICD-10-CM

## 2011-01-11 DIAGNOSIS — M1990 Unspecified osteoarthritis, unspecified site: Secondary | ICD-10-CM

## 2011-01-11 LAB — LIPID PANEL
Cholesterol: 141 mg/dL (ref 0–200)
Total CHOL/HDL Ratio: 3
Triglycerides: 132 mg/dL (ref 0.0–149.0)

## 2011-01-11 LAB — CBC WITH DIFFERENTIAL/PLATELET
Basophils Absolute: 0 10*3/uL (ref 0.0–0.1)
Basophils Relative: 0.4 % (ref 0.0–3.0)
Eosinophils Absolute: 0.3 10*3/uL (ref 0.0–0.7)
Lymphocytes Relative: 17.7 % (ref 12.0–46.0)
MCHC: 33.6 g/dL (ref 30.0–36.0)
Monocytes Absolute: 0.9 10*3/uL (ref 0.1–1.0)
Neutrophils Relative %: 68.4 % (ref 43.0–77.0)
Platelets: 234 10*3/uL (ref 150.0–400.0)
RBC: 5.38 Mil/uL (ref 4.22–5.81)

## 2011-01-11 LAB — PSA: PSA: 1.4 ng/mL (ref 0.10–4.00)

## 2011-01-11 LAB — BASIC METABOLIC PANEL
BUN: 27 mg/dL — ABNORMAL HIGH (ref 6–23)
CO2: 28 mEq/L (ref 19–32)
Chloride: 102 mEq/L (ref 96–112)
Creatinine, Ser: 1.3 mg/dL (ref 0.4–1.5)
Potassium: 4.8 mEq/L (ref 3.5–5.1)

## 2011-01-11 LAB — HEPATIC FUNCTION PANEL
ALT: 24 U/L (ref 0–53)
AST: 23 U/L (ref 0–37)
Bilirubin, Direct: 0.1 mg/dL (ref 0.0–0.3)
Total Protein: 6.9 g/dL (ref 6.0–8.3)

## 2011-01-11 LAB — TSH: TSH: 2.65 u[IU]/mL (ref 0.35–5.50)

## 2011-01-11 MED ORDER — DICLOFENAC SODIUM 75 MG PO TBEC: 75.0000 mg | DELAYED_RELEASE_TABLET | Freq: Two times a day (BID) | ORAL | Status: DC

## 2011-01-11 NOTE — Patient Instructions (Signed)
Today we updated your med list in our EPIC system...    Continue your current meds the same...    We wrote a new prescription for DICLOFENAC 75mg  to take twice daily w/ food as needed for the left ankle pain...  If the pain persists & does not improve w/ hot soaks & the anti-inflamm med, then we will refer you to an Orthopedist...  Today we did your follow up CXR & fasting blood work...    Please call the PHONE TREE in a few days for your results...    Dial [id number] & when prompted enter your patient number followed by the # symbol...    Your patient number is:  [phone]  Let's get on track w/ our low fat, weight reducing diet!! Call for any questions... Let's plan a follow up visit in 6 months.Marland KitchenMarland Kitchen

## 2011-01-11 NOTE — Progress Notes (Signed)
Subjective:    Patient ID: Tracy [name], male    DOB: Nov 15, 1925, 77 y.o.   MRN: [id number]  HPI 77 y/o WM here for a follow up visit... he has mult med problems including:  OSA;  COPD;  HBP;  Cerebrovasc dis;  Hyperlipidemia;  HH/ Divertics/ Colon polyps;  Kidney cancer & Bladder cancer;  DJD...  ~  July 06, 2010:  he reports a good 22mo- he had his yearly f/u DrOttelin w/ cysto neg & doing well by his hx... denies resp exac;  BP remains controlled on meds;  no cerebral ischemic symptoms;  he is not fasting today & labs from 4/11 reviewed & looked good... OK Flu shot today...  ~  January 11, 2011:  He went to the ER 1/12 w/ right sided abd pain> eval revealed right rectus musc hematoma, no known trauma, Hg16, subseq ecchymosis & resolved over time...  He states breathing OK, BP well controlled, no cerebral ischemic symptoms, due for fasting labs (see below);  He's had some left ankle pain/ tendonitis & we discussed Diclofenac trial...        Problem List:  OBSTRUCTIVE SLEEP APNEA (ICD-327.23) - on CPAP Qhs & using it regularly, doing well...  ~  sleep study 3/06 w/ RDI 26 & mod snoring w/ desat to 76%...   COPD (ICD-496) - on home O2 (using it more regularly now), ADVAIR 250Bid, SPIRIVA 1/d, MUCINEX 1Bid... ~  baseline CXR shows COPD, NAD... ~  PFT 11/04 showed FVC=4.51 (103%), FEV1=1.93 (67%), %1sec=43, mid-flows=18%... LV's w/ some air trapping and DLCO~50%...  ~  CT Chest 9/05 showed marked emphysema w/ blebs, & thor spondylosis... ~  Office spirometry 11/05 showed FEV1=1.57 (48%)... ~  PFT 4/10 showed FVC= 3.00 (77%), FEV1= 1.37 (45%), %1sec= 46, mid-flows= 16% pred... ~  CXR 4/10 showed COPD/ emphysema, NAD.Marland KitchenMarland Kitchen ~  yearly f/u CXR 4/11 showed COPD/ E, calcif Thor Ao, prom pulm arts, DJD in spine... ~  4/12:  CXR showed COPD/ E, NAD; min cough & beige phlegm, no recent infections; chr stable DOE w/o change.  HYPERTENSION, BORDERLINE (ICD-401.9) - on MICARDIS/Hct 40-12.5 daily... ~   4/12:  BP= 118/76 and his weight is stable at 200#... discussed diet + exerc, get wt down; denies HA, fatigue, visual changes, CP, palipit, dizziness, syncope, edema, etc...  CEREBROVASCULAR DISEASE (ICD-437.9) - takes PLAVIX 75mg /d & refuses to take ASA 81mg  due to some minor bruising from thin skin- we discussed this and I rec (again) that he take the ASA daily.. ~  MRI Br 11/06 shows mod atrophy, extensive chr microvasc ischemia... 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 ~  4/12:  he denies weakness, numbness, cerebral ischemic symptoms, etc...  HYPERLIPIDEMIA (ICD-272.4) - on SIMVASTATIN 40mg /d + Fish Oil... ~  FLP 4/09 on Vytor10-40 showed TChol 116, TG 184, HDL 36, LDL 44...rec- continue same. ~  [facility] required change to Simvastatin 2010 & started on 40mg /d. ~  FLP 4/10 showed TChol 136, TG 208, HDL 36, LDL 65... rec> may need fibarte, get wt down first! ~  FLP 4/11 showed TChol 149, TG 203, HDL 41, LDL 83 ~  FLP 4/12 showed TChol 141, TG 132, HDL 43, LDL 72... Continue Simva40, FishOil, diet...  OVERWEIGHT (ICD-278.02) - weight stable at ~200#... discussed diet + exercise program...  HIATAL HERNIA (ICD-553.3) - he denies reflux symptoms, trouble swallowing, etc...  DIVERTICULOSIS OF COLON (ICD-562.10) - he uses Metamucil daily & doing well. COLONIC POLYPS (ICD-211.3) - last colonoscopy 7/07  by DrSam showed divertics, hems, polyps...  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 he continues regular GU follow up w/ DrOttelin yearly in the fall... ~  labs 4/09 showed BUN= 22, Creat= 1.4, PSA's= per DrOttelin... ~  labs 4/10 showed BUN= 22, Creat= 1.3, K= 4.6 ~  labs 4/11 showed BUN= 19, Creat= 1.3 ~  Labs 4/12 showed BUN= 27, Creat= 1.3, K= 4.8, PSA= 1.40  DEGENERATIVE JOINT DISEASE  (ICD-715.90) - he uses tylenol Prn... ~  labs 4/10 showed Vit D level = 36... rec> 1000 u/d OTC  Health Maintenance: ~  colonoscopy 7/07 by DrSamLeB showed divertics, hems, +polyps ~  GU: followed by DrOttelin & seen each fall- neg cysto & he does his PSAs... ~  Immunizations: had PNEUMOVAX in 38 @ age 59... OK TDAP 4/11... he gets yearly Flu vaccine each fall.   Past Surgical History  Procedure Date  . Hernia repair   . Nephroureterectomy     Outpatient Encounter Prescriptions as of 01/11/2011  Medication Sig Dispense Refill  . Cholecalciferol (VITAMIN D3) 1000 UNITS CAPS Take by mouth.        . clopidogrel (PLAVIX) 75 MG tablet Take 75 mg by mouth daily.        . fish oil-omega-3 fatty acids 1000 MG capsule Take 2 g by mouth 2 (two) times daily.        . Fluticasone-Salmeterol (ADVAIR DISKUS) 250-50 MCG/DOSE AEPB Inhale 1 puff into the lungs every 12 (twelve) hours.        Marland Kitchen guaiFENesin (MUCINEX) 600 MG 12 hr tablet Take 1,200 mg by mouth 2 (two) times daily.        . Multiple Vitamins-Minerals (CENTRUM SILVER PO) Take 1 capsule by mouth daily.        . psyllium (METAMUCIL) 58.6 % powder Take 1 packet by mouth daily.        . simvastatin (ZOCOR) 40 MG tablet Take 40 mg by mouth at bedtime.        Marland Kitchen telmisartan-hydrochlorothiazide (MICARDIS HCT) 40-12.5 MG per tablet Take 1 tablet by mouth daily.        Marland Kitchen tiotropium (SPIRIVA) 18 MCG inhalation capsule Place 18 mcg into inhaler and inhale daily.          Allergies  Allergen Reactions  . Sulfamethoxazole     REACTION: "washes" him out per pt    Review of Systems       See HPI - all other systems neg except as noted...      The patient complains of decreased hearing and dyspnea on exertion.  The patient denies anorexia, fever, weight loss, weight gain, vision loss, hoarseness, chest pain, syncope, peripheral edema, prolonged cough, headaches, hemoptysis, abdominal pain, melena, hematochezia, severe indigestion/heartburn,  hematuria, incontinence, muscle weakness, suspicious skin lesions, transient blindness, difficulty walking, depression, unusual weight change, abnormal bleeding, enlarged lymph nodes, and angioedema.      Objective:   Physical Exam      WD, Overweight, 77 y/o WM in NAD... GENERAL:  Alert & oriented; pleasant & cooperative... HEENT:  [place]/AT, EOM-wnl, PERRLA, EACs-clear, TMs-wnl, NOSE-clear, THROAT-clear & wnl. NECK:  Supple w/ fairROM; no JVD; normal carotid impulses w/o bruits; no thyromegaly or nodules palpated; no lymphadenopathy. CHEST:  Decr BS bilat, clear to P & A; without wheezes/ rales/ or rhonchi heard... HEART:  Regular Rhythm; distant HS, without murmurs/ rubs/ or gallops detected... ABDOMEN:  Soft & nontender; normal bowel sounds; no organomegaly or masses palpated... EXT: without deformities, mild arthritic changes; no varicose veins/ +venous insuffic/ no edema. NEURO:  CN's intact;  no focal neuro deficits... DERM:  No lesions noted; no rash x few ecchymoses...   Assessment & Plan:   COPD/ OSA>  Stable on CPAP, O2, Advair, Spiriva, Mucinex;  Continue same Rx + regular exercise program...  HBP>  Controlled on the Micardis/HCT, continue same, needs better diet- no slat, get wt down...  Cerebrovasc dis>  Stable on the Plavix, requested to try the 81mg  ASA again as well...  Hyperlipid>  Stable on the Simva & Fish Oil...  GU>  Hx both kidney & bladder cancers followed by DrOttelin & stable w/o signs recurrence...  DJD>  He has some active tendonitis in ankle> rec try Diclofenac prn use.Marland KitchenMarland Kitchen

## 2011-01-17 ENCOUNTER — Encounter: Payer: Self-pay | Admitting: Pulmonary Disease

## 2011-01-20 ENCOUNTER — Other Ambulatory Visit: Payer: Self-pay | Admitting: Pulmonary Disease

## 2011-02-12 NOTE — Discharge Summary (Signed)
Tracy [name], Tracy [name]                          ACCOUNT NO.:  [id number]   MEDICAL RECORD NO.:  [id number]                   PATIENT TYPE:  INP   LOCATION:  0372                                 FACILITY:  [facility]   PHYSICIAN:  Mark C. Vernie Ammons, M.D.               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   OTHER DIAGNOSES:  1. Hypertension.  2. Left varicocele.  3. Elevated PSA.  4. Degenerative joint disease of the thumb.  5. Kidney stones.  6. Chronic obstructive pulmonary disease.   OPERATION:  A left nephroureterectomy.   DISPOSITION:  The patient is discharged home in a stable, satisfactory and  improved condition.  He is tolerating a regular diet.  He is afebrile with  no signs of wound infection, and a Foley catheter indwelling.   CONDITION ON DISCHARGE:  Improved.   ACTIVITY:  Limited to no heavy lifting, straining, or vigorous activity.  No  driving until given further notice.   FOLLOWUP:  In my office next week on Monday or Tuesday for skin staple  removal and catheter removal.   DISCHARGE MEDICATIONS:  1. Oxycodone which he has at home already.  2. Levaquin 250 mg q.d.  3. Stool softener using Colace 100 mg b.i.d.   HISTORY OF PRESENT ILLNESS:  The patient is a 77 year old white male with a  history of transitional cell carcinoma of the bladder in the distant past  who was found to have microscopic hematuria.  Workup included cystoscopy  which revealed the bladder to be free of lesions, but a filling defect in  the lower pole of the left kidney which was visualized ureteroscopically and  biopsied and revealed transitional cell carcinoma.  Despite its low grade,  its size and location precluded any type of less invasive procedure to  remove the lesion, and therefore the patient is admitted for  left  nephroureterectomy.  His full history and physical is completely dictated  previously, and will not be re-dictated at this time.  Of note, his PSA was  found to be 0.98, and his creatinine on admission was 1.2.   HOSPITAL COURSE:  On 07/02/02, the patient underwent a left  nephroureterectomy without complications or need for transfusion.  Postoperatively, he had a Foley catheter indwelling as well as a pelvic  drain and was noted to be doing well the night of his surgery.  The  following morning, he continued to have good output with 36 cc from his  drain per shift.  He was begun on early ambulation and pulmonary toilet.  He  remained on O2.  By his second postoperative day, he was having decreased  flank pain, no shortness of breath or chest pain, and a slight elevation  in  his temperature to 101.1.  Chest x-ray was obtained that revealed bilateral  atelectasis and small bilateral pleural effusions, otherwise negative.  He  was slowly weaned off his oxygen and his drain output diminished, and the  drain was therefore removed.  His bowels responded to laxatives, and his  diet was advanced to regular without difficulty, nausea or vomiting.   His pathology returned a grade 1, 1.2 cm papillary TCCA of the lower pole  calyx without evidence of invasion.  I discussed that with the patient.  By  this third postoperative day, he had bowel movements, and they only  difficulty was O2 dependence.  I discussed his case with Dr. Kriste Basque, however,  on the morning of 06/29/02, he appeared to be doing much better, and no  longer O2 dependent with relatively good saturations on room air.  He was  felt ready for discharge.  I told Dr. Kriste Basque that a consultation would not be  necessary.  He therefore was felt ready for discharge at this time.  If he  has any difficulty, I have instructed him to contact me immediately.  He is  otherwise felt ready for discharge at this time.                                                Mark C. Vernie Ammons, M.D.    MCO/MEDQ  D:  07/06/2002  T:  07/06/2002  Job:  [id number]   cc:   Lonzo Cloud. Kriste Basque, M.D. [facility]

## 2011-02-12 NOTE — Op Note (Signed)
Tracy [name], Tracy [name]                         ACCOUNT NO.:  [id number]   MEDICAL RECORD NO.:  [id number]                   PATIENT TYPE:  AMB   LOCATION:  NESC                                 FACILITY:  [facility]   PHYSICIAN:  Mark C. Vernie Ammons, M.D.               DATE OF BIRTH:  06-19-1926   DATE OF PROCEDURE:  06/01/2002  DATE OF DISCHARGE:                                 OPERATIVE REPORT   PREOPERATIVE DIAGNOSES:  1. Right hydrocele.  2. 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   PROCEDURE:  1. Cystoscopy.  2. Bilaterally retrograde pyelograms with interpretation.  3. Left ureteroscopy.  4. Biopsy of renal pelvic mass with right hydrocelectomy.   SURGEON:  Mark C. Vernie Ammons, M.D.   ANESTHESIA:  General.   DRAINS:  None.   SPECIMENS:  Biopsy of renal pelvic mass.   ESTIMATED BLOOD LOSS:  5 cc.   COMPLICATIONS:  None.   INDICATIONS:  The patient is a 77 year old white male, who had a history of  transitional cell carcinoma in the very distant past.  He came in for  evaluation and treatment of a large right hydrocele.  During his evaluation,  he was found to have microscopic hematuria.  He denied any gross hematuria.  A renal ultrasound revealed no masses of the kidneys or hydronephrosis.  He  is brought to the OR today for right hydrocelectomy and evaluation of his  bladder and upper tracts.   DESCRIPTION OF OPERATION:  After informed consent, the patient brought to  the major OR, placed on the table, administered general anesthesia, then  moved to the dorsal lithotomy position.  His genitalia was then sterilely  prepped and draped and initially a 21 French cystoscope with both 12 and 70  degree lenses were used to evaluate the urethra which was normal, the  prostatic urethra which revealed bilobar hypertrophy, and the bladder itself  which revealed several old scars but no tumor, stones, or  inflammatory  lesions.  Both ureteral orifices were normal in configuration and position.   A right retrograde pyelogram was performed, and it revealed a normal ureter  throughout its course and a nice delicate intrarenal collecting system with  no filling defects or other abnormalities.  This was performed then on the  left side.  In both cases, a 6 [place] open-ended ureteral catheter was used.  This revealed some tortuosity of the left ureter but no filling defects or  abnormalities.  The renal pelvis, however, did have an area that appeared to  be linear and persistent on multiple films in the area of the lower pole  infundibulum and renal pelvis.  This filling defect was felt to be very  suspicious in light of his past history.   A 0.038 inch floppy-tip guidewire was then passed through the open-ended  ureteral catheter and  up the ureter into the renal pelvis under fluoroscopic  control, after which a 6 French flexible ureteroscope was passed over the  guidewire easily into the area of the renal pelvis.  The guidewire was  removed, and the renal pelvis was fully inspected.  It was noted to be  entirely normal except for the floor of the renal pelvis where the filling  defect was noted.  At that location, there appeared to be a papillary lesion  most consistent with transitional cell carcinoma.  I used the biopsy forceps  through the ureteroscope and obtained several small biopsies of the lesion.  I then removed the ureteroscope and drained the bladder.   A midline scrotal incision was then made, and the tissue overlying the  hydrocele was dissected away using the Metzenbaum scissors.  The hydrocele  was then entered, drained, and the testicle delivered.  I incised the  hydrocele sac on its anterior surface in a cephalad direction and then  wrapped it around the posterior aspect of the testicle and cord and sewed it  in this location with a running 3-0 chromic suture.  The appendix,  testis,  and appendix epididymus were then removed with the electrocautery unit, and  the testicle was re-placed into the scrotum.  The deep scrotal tissue was  closed with a running locking 3-0 chromic suture, and the skin was closed  with running 3-0 chromic.  Neosporin, a 4 x 4, and then fluffed 4 x 4's were  used as a scrotal dressing, and scrotal support was applied.  The patient  was awakened and taken to the recovery room in stable satisfactory  condition.  He tolerated the procedure well with no intraoperative  complications.   He will be given a prescription for #28 Tylox and #6 Keflex and will be  contacted with the result of his biopsy.  He will likely need to have a CT  scan of his kidneys and then determination as to whether he will need to  undergo a nephroureterectomy will be made.                                               Mark C. Vernie Ammons, M.D.    MCO/MEDQ  D:  06/01/2002  T:  06/01/2002  Job:  [id number]

## 2011-02-12 NOTE — H&P (Signed)
NAMEDENY, Tracy [name]                          ACCOUNT NO.:  [id number]   MEDICAL RECORD NO.:  [id number]                   PATIENT TYPE:  INP   LOCATION:  0004                                 FACILITY:  [facility]   PHYSICIAN:  Mark C. Vernie Ammons, M.D.               DATE OF BIRTH:  09/21/1926   DATE OF ADMISSION:  07/02/2002  DATE OF DISCHARGE:                                HISTORY & PHYSICAL   HISTORY OF PRESENT ILLNESS:  The patient is a 77 year old white male who has  a history of transitional cell carcinoma of the bladder.  He was seen  recently after having not been seen since June 2000, and reported no gross  hematuria or other voiding complaints.  With a history of transitional cell  carcinoma of the bladder in the distant past, a urinalysis revealed  microscopic hematuria, and the patient also had a right hydrocele which was  symptomatic.  He, therefore, underwent right hydrocelectomy.  At the time of  that procedure I performed a retrograde pyelogram.  There appeared to be a  filling defect in the lower pole of his left kidney, and ureteroscopy  confirmed a papillary growth that was biopsied and revealed transitional  cell carcinoma.  He has had a renal ultrasound that revealed no other  abnormalities at the time of his office visit and has also undergone CT scan  of the abdomen and pelvis which revealed a stone in his left kidney as well  as a small right upper pole calculus but no evidence of adenopathy.  There  was mild sigmoid diverticulosis but no evidence of diverticulitis.  No  extension of disease process was seen outside of the kidney.  He is,  therefore, admitted for left nephroureterectomy.   PAST MEDICAL HISTORY:  1. History of slightly elevated blood pressure.  2. Left varicocele.  3. Elevated PSA.  4. Degenerative joint disease of the thumb.  5. Kidney stones.   PAST SURGICAL HISTORY:  Cystoscopy and transurethral resection of a bladder  tumor back in 1983, and  right hydrocelectomy and left ureteroscopy as noted  above.   MEDICATIONS:  Ambid DM.  Centrum Silver.   ALLERGIES:  SULFA causes a rash.   SOCIAL HISTORY:  He denies substance abuse.  He does drink occasional  alcohol and used to smoke one to two packs a day for 35 years and quit in  1983.   FAMILY HISTORY:  Mother died of colon cancer at 56.  Father lived into his  mid 37s, and there is no history of GU malignancy or renal disease in the  family.   REVIEW OF SYSTEMS:  Positive for recent passage of a kidney stone.  Some  occasional shortness of breath with exertion.  Otherwise negative per health  history section 2, which was reviewed and signed.   PHYSICAL EXAMINATION:  VITAL SIGNS:  Blood pressure is 124/84, respirations  16, pulse 68, temperature 97.9.  GENERAL:  Well-developed, well-nourished white male in no apparent distress.  HEENT:  Atraumatic, normocephalic.  Oropharynx is clear.  NECK:  Supple.  Without JVD or adenopathy.  LUNGS:  Clear.  CARDIOVASCULAR:  Regular rate and rhythm.  ABDOMEN:  Slightly obese.  Soft and nontender.  Without mass or HSM.  He has  no CVAT.  He has a healed midline scrotal scar from his recent  hydrocelectomy.  Small left varicocele.  No inguinal hernias or adenopathy.  GENITOURINARY:  Normal phallus, glans meatus, anus, and perineum.  RECTAL:  Normal rectal tone.  His prostate is smooth, symmetric, and has no  suspicious nodularity or induration.  EXTREMITIES:  Without clubbing, cyanosis, or edema.  No deformity.  NEUROLOGIC:  No gross focal neurologic deficits.  He is alert and oriented,  with appropriate mood and affect.   LABORATORY DATA:  His urinalysis is clear by dipstick, and microscopically  he has 0-2 white cells and 3-6 red cells.  PSA is 0.98.  White count 8.1,  H&H 16.8 and 50.0, platelets 247.  Electrolytes are normal with a creatinine  of 1.2.  PT is 14.3, INR 1.1, PTT 35.   Chest x-ray revealed nodular opacity in the right  lung apex on a study done  May 01, 2002.  Follow-up CT scan on May 17, 2002, revealed no evidence  of mass within the lung, with moderately severe COPD.  ____________ renal  cysts were seen as well.   EKG:  Sinus bradycardia with a rate of 50, and no acute ST or T-wave  changes.   IMPRESSION:  1. Grade 1, what appears to be superficial but large size, transitional cell     carcinoma located in peripheral lower pole calix of the left kidney.  I     have discussed with the patient the fact that its location would preclude     the attempted use of laser, and I think also the size would not afford     the ability to completely eliminate all of the malignant tissue with     potential for further growth and spread.  We, therefore, discussed     nephroureterectomy as the best treatment option.  I have gone over the     risks and complications as well as limitations, and these are outlined     fully in my office notes, which have been placed in the chart.  2. History of elevated prostate-specific antigen, but his prostate-specific     antigen now is less than 1, with a benign DRE3.  3. History of kidney stones.  He passed a small stone recently on the left     side.  He has a very small stone on the right which should not pose a     significant problem but will need to be followed.   PLAN:  1. Routine DVT and PE prophylaxis.  2. Perioperative antibiotics.  3. Left nephroureterectomy.                                               Mark C. Vernie Ammons, M.D.    MCO/MEDQ  D:  07/02/2002  T:  07/02/2002  Job:  [id number]   cc:   Lonzo Cloud. Kriste Basque, M.D. [facility]

## 2011-02-12 NOTE — Procedures (Signed)
NAMEAXZEL, ROCKHILL                ACCOUNT NO.:  [id number]   MEDICAL RECORD NO.:  [id number]          PATIENT TYPE:  OUT   LOCATION:  SLEEP CENTER                 FACILITY[facility]  [facility]   PHYSICIAN:  Marcelyn Bruins, M.D. [facility] DATE OF BIRTH:  [birth year]   DATE OF STUDY:  12/04/2004                              NOCTURNAL POLYSOMNOGRAM   REFERRING PHYSICIAN:  Alroy Dust, MD   INDICATION FOR STUDY:  Hypersomnia with sleep apnea.  Epworth score 7   SLEEP ARCHITECTURE:  Total sleep time is 351 minutes with decreased REM and  adequate slow wave sleep. Sleep onset latency was mildly prolonged and REM  latency was normal.   IMPRESSION:  1.  Moderate obstructive sleep apnea/hypopnea syndrome with a respiratory      disturbance index of 26 events per hour and oxygen  desaturation as low      as 76%. The events were clearly worse during rapid eye movement.  2.  Oxygen at 1 L was added during the study for oxygen saturations in the      80s and not associated with obstructive events.  3.  Moderate snoring noted throughout the study.  4.  No clinically significant cardiac arrhythmias.      KC/MEDQ  D:  12/17/2004 12:54:09  T:  12/17/2004 18:05:49  Job:  [id number]

## 2011-02-12 NOTE — Op Note (Signed)
Tracy [name], Tracy [name]                          ACCOUNT NO.:  [id number]   MEDICAL RECORD NO.:  [id number]                   PATIENT TYPE:  AMB   LOCATION:  DFTL                                 FACILITY:  [facility]   PHYSICIAN:  Adolph Pollack, M.D.            DATE OF BIRTH:  [birth year]   DATE OF PROCEDURE:  04/04/2003  DATE OF DISCHARGE:                                 OPERATIVE REPORT   PREOPERATIVE DIAGNOSIS:  Left flank incisional hernia.   POSTOPERATIVE DIAGNOSIS:  Left flank incisional hernia.   PROCEDURE:  Left flank incisional hernia repair with mesh.   SURGEON:  Adolph Pollack, M.D.   ASSISTANT:  Currie Paris, M.D.   ANESTHESIA:  General.   INDICATIONS:  Mr. Spahr is a 77 year old male status post left  nephroureterectomy for transitional cell carcinoma October 2003.  He has  developed an increasing bulge in the left flank region.  A CT scan was  obtained demonstrating findings consistent with incisional hernia in the  left flank region.  He subsequently presents now for repair.  The procedure  and the risks were explained to him preoperatively.   DESCRIPTION OF PROCEDURE:  He was seen in the holding area and the left  flank incision was noted.  He was brought to the operating room, placed  supine on the operating table and general anesthetic was administered.  He  was then placed in the right-side down, left-side up position on a bean bag  with all pressure points adequately padded.  The left flank area was then  sterilely prepped and draped.   The previous left flank incision was incised sharply using the cautery.  In  the mid portion of the incision, was an extremely attenuated muscle with  really no anterior fascia.  This was used to imbricate inward.  I began  raising subcutaneous flaps until I could find fascia going up to the rib  area superiorly and down to the anterior superior iliac spine.  I was able  to mobilize some of this and approximate  it over the attenuated muscle.  With interrupted #1 Novofil sutures, I subsequently approximated this mesh  over the attenuated muscle.  I left these sutures tied and did not cut them.   Following this, I brought a piece of polypropylene mesh into the field and  laid it over the repaired defect. I  brought up previously primary closure  sutures up through the mesh and anchored them to the mesh.  I then cut the  mesh and overlapped the defect and then anchored it to the surrounding  fascia with running #0 Prolene sutures.  This provided more than adequate  coverage of the defect by both the primary repair and the fascial overlap.   At this point, no bleeding was noted.  Antibiotic solution was used for  irrigation.  A stab wound was made in the lower portion of the  incision and  a #19 Blake drain was placed into the wound and anchored to the skin with a  nylon suture.  Sponge, needle and instrument counts were reported to be  correct.  I closed the subcutaneous tissue over the drain and mesh with a  running 2-0 Vicryl suture.  The skin was then closed with 4-0 Monocryl  subcuticular stitches.  Steri-Strips and sterile dressings were applied.   He tolerated the procedure well without apparent complications.  I did not  see obvious colon herniated into the defect.  He subsequently was extubated  and taken out of the operating room in satisfactory condition.                                               Adolph Pollack, M.D.    Kari Baars  D:  04/04/2003  T:  04/04/2003  Job:  [id number]   cc:   Veverly Fells. Vernie Ammons, M.D.  [address]  [place]  [place] [address]  Fax: [phone]   Lonzo Cloud. Kriste Basque, M.D. [facility]

## 2011-02-12 NOTE — Op Note (Signed)
NAMELESLEY, GALENTINE                          ACCOUNT NO.:  [id number]   MEDICAL RECORD NO.:  [id number]                   PATIENT TYPE:  INP   LOCATION:  0372                                 FACILITY:  [facility]   PHYSICIAN:  Melvyn Novas, M.D.                DATE OF BIRTH:  [birth year]   DATE OF PROCEDURE:  07/02/2002  DATE OF DISCHARGE:                                 OPERATIVE REPORT   PREOPERATIVE DIAGNOSIS:  Left renal transitional cell carcinoma.   POSTOPERATIVE DIAGNOSIS:  Left renal transitional cell carcinoma.   PROCEDURE:  Left nephroureterectomy through two incisions.   ANESTHESIA:  General.   SURGEON:  Mark C. Vernie Ammons, M.D.   ASSISTANT:  Melvyn Novas, M.D.   ESTIMATED BLOOD LOSS:  575 cc   IV FLUIDS:  3100 cc   URINE OUTPUT:  325 cc   BRIEF HISTORY:  This 77 year old white male with a history of transitional  cell carcinoma of the bladder. He recently presented with microscopic  hematuria. Retrograde pyelography demonstrated a filling defect in the lower  pole of the left kidney and ureteroscopy confirmed a papillary growth that  was biopsied and revealed and transitional cell carcinoma. This lesion was  not amendable to endoscopic oblation. He therefore elected for left  nephroureterectomy.   DESCRIPTION OF PROCEDURE:  Following administration of IV antibiotics and  general anesthesia, Mr. Zuercher was prepped and draped and placed a modified  left flank position. We began the left nephrectomy phase of the procedure  with a left sided subcostal incision. This was made over the left flank.  Sharp dissection as well as Bovie cautery was used to dissect through the  muscular layers of the flank down to the transversalis fascia which was  opened. This allowed [place] to enter the retroperitoneum. Gerota's fascia was  identified as was the peritoneum anterior to that. Gerota's fascia was  bluntly dissected off of the anterior lining of the peritoneum down to the  level of the gonadal vessels which were suture ligated using 2-0 silk ties.  The blunt as well as sharp dissection was then used to identify the ureter  which was dissected free down to about 3-4 cm distal to the ureteropelvic  junction. At that point, it was clipped as well as suture ligated and  divided. A long suture was left over the proximal end of the distal segment  which was then pushed down into the pelvis for later identification. We then  systematically dissected medially down to the level of the renal hilum. A  single large renal vein was identified. Additionally, an adrenal vein  emanating from its superior aspect was identified. The adrenal vein was tied  off using #0 silk ties and ligated. This allowed [place] to palpate the  underlying single renal artery which was posterior and just superior to the  vein. Prior to dissecting free the artery, the vein was completely  dissected  out and two #0 silk ties were placed around it but were not tied yet at that  time. We then systematically dissected out the renal artery using right  angled clamp and Bovie cautery. A right angled clamp was passed beneath the  renal artery and 2-0 silk ties were placed around it. Additionally he had  two long metal clips that were used to ligate the artery proximally just  distal to its take off from the aorta which was clearly identified. Two #0  silk ties were then tied distally near the kidney. The renal artery was then  divided. There was no evidence of any bleeding. There was notable loss of  turgor to the kidney and no further arteries were identified. We then  proceeded to tie as well as clip the renal vein both proximally and distally  and divide it in between. At that point, we turned our attention towards  removing the kidney. The kidney was bluntly and sharply dissected at its  inferior and lateral aspects from the posterior lying psoas muscle and the  inferior lying connective tissue. We did  enter Gerota's fascia superiorly in  order to spare the adrenal. Bovie cautery was used along with clips to  dissect the kidney from the superior lying adrenal gland which was spared.  The kidney was completely freed up and removed from the body. The resection  bed was irrigated and inspected for any sites of bleeding. There was no  evidence of any bleeding from the underlying muscles or the overlying  adrenal. We then proceeded to close the flank wound. Closure was obtained by  approximating the transversalis and internal oblique muscle using a running  #1 PDS. We then closed the external oblique again using a #1 PDS in a  running manner. We then achieved skin closure using staples. At that point,  we redirect our attention to the left lower quadrant for the ureterectomy  phase. A Gibson type incision was made in the left lower quadrant and Bovie  cautery dissection was used to divide the left sided rectus muscles. We then  entered the prevesical space. Blunt dissection was used to identify the left  ureter using the overlying umbilical artery and hypogastric vessels as  landmarks. The left distal ureter was identified and the ureteral stump was  pulled down into the pelvis without difficulty. We then proceeded to use  both sharp and Bovie cautery dissection to free up the distal ureter down to  the level of the bladder detrusor muscle. We then entered the bladder at the  ureteral orifice medially. We then used Bovie cautery to remove the ureter  and the cuff of bladder completely. This caused an approximately 2 cm defect  in the left bladder. The bladder was closed using a running 2-0 chromic  suture to close mucosa and detrusor. We then embrocated another running 2-0  chromic suture over the detrusor and bladder and perivesical fat. The  bladder was then irrigated under pressure and a very small leak was noted at the lateral aspect of the wound which was then closed using a figure-of-   eight 2-0 chromic. There was no further evidence of any leak. At that point,  the JP drain was placed in the left pelvis through a small stab wound medial  to the [facility] incision. The JP drain was sutured in place using a 2-0 silk  suture. The wound was then thoroughly irrigated and there was no evidence of  any bleeding.  The Gibson incision was closed leaving a running #1 PDS to  close the superior rectus sheath. The skin was then closed with staples. The  16 French Foley catheter was placed with a 22 French catheter without  difficulty.  Urine output was clear.   DRAINS:  A 22 French Foley catheter and JP drain.   COMPLICATIONS:  None.   DISPOSITION:  The patient was taken to the recovery room in stable  condition.   Please note that Dr. Vernie Ammons was present and participated in the entire  procedure.                                               Melvyn Novas, M.D.    DK/MEDQ  D:  07/03/2002  T:  07/03/2002  Job:  [id number]

## 2011-02-12 NOTE — Discharge Summary (Signed)
   [name], Tracy [name]                          ACCOUNT NO.:  [id number]   MEDICAL RECORD NO.:  [id number]                   PATIENT TYPE:  INP   LOCATION:  0359                                 FACILITY:  [facility]   PHYSICIAN:  Adolph Pollack, M.D.            DATE OF BIRTH:  [birth year]   DATE OF ADMISSION:  04/04/2003  DATE OF DISCHARGE:  04/08/2003                                 DISCHARGE SUMMARY   PRINCIPAL DISCHARGE DIAGNOSIS:  Left flank incisional hernia.   SECONDARY DIAGNOSES:  1. Transitional cell carcinoma of the bladder and left kidney.  2. Chronic obstructive pulmonary disease.  3. Hiatal hernia.  4. Arthritis.   PROCEDURE:  Left flank incisional hernia repair with mesh.   INDICATION:  [name] is a 77 year old male, status post left  nephroureterectomy in October 2003.  He has noticed an increasing bulge in  the left flank region and a CT scan demonstrated findings consistent with an  incisional hernia in that region.  He now presents for repair.  Procedure  and the risks were discussed with him preoperatively.   HOSPITAL COURSE:  He underwent the above procedure.  Postoperatively he had  a drain in with then drain output.  He had some postoperative fever, but  this was felt to be secondary to atelectasis and once he was up and around  and moving more, this improved.  By his fourth postoperative day his wound  looked good.  He had minimal drain output and this was removed.  He was  moving his bowels and felt to be ready for discharge.   DISPOSITION:  Discharge to home in satisfactory condition on April 08, 2003.  He was given Vicodin for pain and strict activity restrictions.    FOLLOW UP:  He is to come see me on July 15 for staple removal.   MEDICATIONS:  He was told to resume his home medicines and take a laxative  of choice if needed.                                                    Adolph Pollack, M.D.    Kari Baars  D:  04/23/2003  T:   04/23/2003  Job:  [id number]   cc:   Loraine Leriche C. Vernie Ammons, M.D.  [address]  [place]  [place] [address]  Fax: [phone]   Lonzo Cloud. Kriste Basque, M.D. [facility]

## 2011-02-12 NOTE — Consult Note (Signed)
NAMEPELHAM, HENNICK                ACCOUNT NO.:  [id number]   MEDICAL RECORD NO.:  [id number]          PATIENT TYPE:  EMS   LOCATION:  ED                           FACILITY:  [facility]   PHYSICIAN:  Katy Fitch. Sypher Montez Hageman., M.D.DATE OF BIRTH:  07/26/1926   DATE OF CONSULTATION:  01/15/2005  DATE OF DISCHARGE:                                   CONSULTATION   CHIEF COMPLAINT:  Abscess, left palm, status post splinter penetration  injury on January 11, 2005, from a bed slat he was carrying.   HISTORY:  Tracy [name] is a 77 year old gentleman, referred through the  courtesy of Dr. Marcelyn Bruins of [facility] for evaluation and  management of a right palm abscess.   Tracy [name] is a retired gentleman with background medical problems including  chronic obstructive airways disease and elevated cholesterol.   He was in the office to see Dr. Shelle Iron today for evaluation of a presumed  hand infection after his foreign body episode on January 11, 2005.   Dr. Shelle Iron noted an abscess forming over the metacarpal head region of his  right index MP joint and lymphangitis extending over the dorsal aspect of  the metacarpals.  An urgent hand surgery consult was requested.   Tracy [name] was referred directly to the [facility]  Emergency Room, and we saw him on an urgent basis.   His past medical history reveals that he is allergic to SULFA.  Current  medications include Spiriva, Vytorin 20/10, fish oil, [place] 81 mg  aspirin, Centrum Silver, and Guaifenex DM.   Social history reveals that he is retired.  He is a nonsmoker.  He is  married and accompanied by his wife, who is a retired Engineer, civil (consulting), who formerly  worked at [employer] on [address].   His family history is noncontributory.  A 14 system review of systems is  otherwise noncontributory.   PHYSICAL EXAMINATION:  GENERAL:  Awake and alert 77 year old gentleman.  Inspection of his right hand reveals an obvious  abscess in the palm due to a  presumed wood foreign body.  VITAL SIGNS:  Temperature 97.2, blood pressure 163/96, pulse 63 regular,  room air 02 saturation 95%.   Clinical examination revealed signs of a palmar abscess due to foreign body  with lymphangitis extending to the dorsal aspect of the metacarpal at the  carpometacarpal joint.  There was no proximal lymphangitis.   X-rays are deferred.   ASSESSMENT:  Wood foreign body, right palm, with secondary abscess  formation.   PLAN:  We recommended proceeding directly to incision and drainage of the  abscess with attempted removal of the foreign body.   Tracy [name] was placed in a supine position upon a gurney in the emergency  room and, after informed consent and Betadine prep, a 2% lidocaine block was  placed at wrist level of the median and radial nerves.   After 20 minutes, anesthesia was satisfactory in the right median and radial  distribution.   The hand was prepped with Betadine soap and solution and sterilely draped  with sterile towels.   The hand and arm were exsanguinated by direct compression, and a blood  pressure cuff in the proximal brachium was inflated to 250 mmHg as a  tourniquet.   The procedure commenced with an oblique incision directly over the abscess.  Frank pus was immediately recovered under pressure.  The wound was gently  probed with a small hemostat, recovering a 1 cm long wood foreign body.   The abscess was probed to its depth followed by packing with Xeroflow.   There were no apparent complications.  The wound was dressed open with  Xeroflow, sterile gauze, sterile Kerlix, and an Ace wrap.   Tracy [name] is placed on doxycycline 100 mg p.o. b.i.d. x 7 days.   We will provide 1 g of Ancef, as he has early lymphangitis.   We discussed the possibility of methicillin-resistant Staphylococcus aureus,  although this is a community-acquired infection from home, the odds are that  Ancef may be  efficacious.   He was instructed to contact our office for follow up on Monday, January 18, 2005, or to call us urgently over the weekend should he develop signs of  lymphangitis, fever, malaise, or ascending infection.      RVS/MEDQ  D:  01/15/2005  T:  01/15/2005  Job:  [id number]   cc:   Marcelyn Bruins, M.D. [facility]

## 2011-02-20 ENCOUNTER — Other Ambulatory Visit: Payer: Self-pay | Admitting: Pulmonary Disease

## 2011-02-22 ENCOUNTER — Other Ambulatory Visit: Payer: Self-pay | Admitting: Pulmonary Disease

## 2011-02-24 ENCOUNTER — Telehealth: Payer: Self-pay | Admitting: Pulmonary Disease

## 2011-02-24 MED ORDER — FLUTICASONE-SALMETEROL 250-50 MCG/DOSE IN AEPB: 1.0000 | INHALATION_SPRAY | Freq: Two times a day (BID) | RESPIRATORY_TRACT | Status: DC

## 2011-02-24 MED ORDER — SIMVASTATIN 40 MG PO TABS: 40.0000 mg | ORAL_TABLET | Freq: Every day | ORAL | Status: DC

## 2011-02-24 NOTE — Telephone Encounter (Signed)
Refills sent. Pt aware.  Marquarius Lofton, CMA  

## 2011-05-05 ENCOUNTER — Encounter: Payer: Self-pay | Admitting: Gastroenterology

## 2011-06-14 ENCOUNTER — Other Ambulatory Visit: Payer: Self-pay | Admitting: *Deleted

## 2011-06-14 ENCOUNTER — Telehealth: Payer: Self-pay | Admitting: Pulmonary Disease

## 2011-06-14 MED ORDER — CLOPIDOGREL BISULFATE 75 MG PO TABS: 75.0000 mg | ORAL_TABLET | Freq: Every day | ORAL | Status: DC

## 2011-06-14 NOTE — Telephone Encounter (Signed)
Spoke with pt. He states still waiting on refill of plavix. I advised that rx refill for this was sent to his pharmacy this am. Pt verbalized understanding and states nothing further needed.

## 2011-07-12 ENCOUNTER — Ambulatory Visit (INDEPENDENT_AMBULATORY_CARE_PROVIDER_SITE_OTHER): Payer: Medicare Other | Admitting: Pulmonary Disease

## 2011-07-12 ENCOUNTER — Encounter: Payer: Self-pay | Admitting: Pulmonary Disease

## 2011-07-12 DIAGNOSIS — I10 Essential (primary) hypertension: Secondary | ICD-10-CM

## 2011-07-12 DIAGNOSIS — I679 Cerebrovascular disease, unspecified: Secondary | ICD-10-CM

## 2011-07-12 DIAGNOSIS — C679 Malignant neoplasm of bladder, unspecified: Secondary | ICD-10-CM

## 2011-07-12 DIAGNOSIS — M1990 Unspecified osteoarthritis, unspecified site: Secondary | ICD-10-CM

## 2011-07-12 DIAGNOSIS — E785 Hyperlipidemia, unspecified: Secondary | ICD-10-CM

## 2011-07-12 DIAGNOSIS — J4489 Other specified chronic obstructive pulmonary disease: Secondary | ICD-10-CM

## 2011-07-12 DIAGNOSIS — K5730 Diverticulosis of large intestine without perforation or abscess without bleeding: Secondary | ICD-10-CM

## 2011-07-12 DIAGNOSIS — G4733 Obstructive sleep apnea (adult) (pediatric): Secondary | ICD-10-CM

## 2011-07-12 DIAGNOSIS — K449 Diaphragmatic hernia without obstruction or gangrene: Secondary | ICD-10-CM

## 2011-07-12 DIAGNOSIS — J449 Chronic obstructive pulmonary disease, unspecified: Secondary | ICD-10-CM

## 2011-07-12 DIAGNOSIS — Z23 Encounter for immunization: Secondary | ICD-10-CM

## 2011-07-12 DIAGNOSIS — D126 Benign neoplasm of colon, unspecified: Secondary | ICD-10-CM

## 2011-07-12 DIAGNOSIS — E663 Overweight: Secondary | ICD-10-CM

## 2011-07-12 MED ORDER — TIOTROPIUM BROMIDE MONOHYDRATE 18 MCG IN CAPS: 18.0000 ug | ORAL_CAPSULE | Freq: Every day | RESPIRATORY_TRACT | Status: DC

## 2011-07-12 MED ORDER — FLUTICASONE-SALMETEROL 250-50 MCG/DOSE IN AEPB: 1.0000 | INHALATION_SPRAY | Freq: Two times a day (BID) | RESPIRATORY_TRACT | Status: DC

## 2011-07-12 MED ORDER — SIMVASTATIN 40 MG PO TABS: 40.0000 mg | ORAL_TABLET | Freq: Every day | ORAL | Status: DC

## 2011-07-12 MED ORDER — CLOPIDOGREL BISULFATE 75 MG PO TABS: 75.0000 mg | ORAL_TABLET | Freq: Every day | ORAL | Status: DC

## 2011-07-12 MED ORDER — TELMISARTAN-HCTZ 40-12.5 MG PO TABS: 1.0000 | ORAL_TABLET | Freq: Every day | ORAL | Status: DC

## 2011-07-12 NOTE — Patient Instructions (Signed)
Today we updated your medsin EPIC...    We refilled your meds per request...  We reviewed you prev labs & XRays> everything appears stable...  Call for any problems...  Let's plan a follow up visit in 6 months w/ CXR & FASTING blood work at that time.Marland KitchenMarland Kitchen

## 2011-07-12 NOTE — Progress Notes (Signed)
Subjective:    Patient ID: Tracy [name], male    DOB: [birth year], 77 y.o.   MRN: [id number]  HPI 77 y/o WM here for a follow up visit... he has mult med problems including:  OSA;  COPD;  HBP;  Cerebrovasc dis;  Hyperlipidemia;  HH/ Divertics/ Colon polyps;  Kidney cancer & Bladder cancer;  DJD...  ~  July 06, 2010:  he reports a good 82mo- he had his yearly f/u DrOttelin w/ cysto neg & doing well by his hx... denies resp exac;  BP remains controlled on meds;  no cerebral ischemic symptoms;  he is not fasting today & labs from 4/11 reviewed & looked good... OK Flu shot today...  ~  January 11, 2011:  He went to the ER 1/12 w/ right sided abd pain> eval revealed right rectus musc hematoma, no known trauma, Hg16, subseq ecchymosis & resolved over time...  He states breathing OK, BP well controlled, no cerebral ischemic symptoms, due for fasting labs (see below);  He's had some left ankle pain/ tendonitis & we discussed Diclofenac trial...  ~  July 12, 2011:  82mo ROV> he's been stable at 77 y/o, no new complaints or concerns, requests refills & Flu shot today...    OSA> on CPAP nightly; doing well w/o snoring reported or daytime hypersomnolence...    COPD> on Home O2, Advair, Spiriva, Mucinex; he plays golf 2x per week; denies cough, sputum, ch in baseline dyspnea, etc...    HBP> on MicardisHCT; BP= 138/70 & he denies HA, CP, palpit, ch in dyspnea, edema, etc...    Cerebrovasc dis> on Plavix; he refuses to take ASA due to bruising; no cerebral ischemic symptoms...    Hyperlipid> on Simva40 + FishOil; we reviewed his yearly labs from 4/12; continue same meds + diet...    Overweight> weight= 198# about the same; we discussed weight reduction...    GI> HH, Divertics, Polyps> on Metamucil regularly; he denies abd pain, N/V/D/C/ or blood seen; last colon was 2007 as noted...    GU> Bladder Ca & left Kidney Ca> followed by DrOttelin; pt reports seen 2wks ago & "everything was fine" w/ neg cysto & f/u  planned 45yr.    DJD> off prev Voltaren Rx & using OTC Tylenol arthritis prn; he reports seeing Ortho in [place], [facility], w/ XRay of ankle showing arthritis & Rx OTC meds...        Problem List:  OBSTRUCTIVE SLEEP APNEA (ICD-327.23) - on CPAP Qhs & using it regularly, doing well...  ~  sleep study 3/06 w/ RDI 26 & mod snoring w/ desat to 76%...  ~  Stable on CPAP nightly w/ improved snoring & no daytime symptoms...  COPD (ICD-496) - on home O2 (using it more regularly now), ADVAIR 250Bid, SPIRIVA 1/d, MUCINEX 1Bid... ~  baseline CXR shows COPD, NAD... ~  PFT 11/04 showed FVC=4.51 (103%), FEV1=1.93 (67%), %1sec=43, mid-flows=18%... LV's w/ some air trapping and DLCO~50%...  ~  CT Chest 9/05 showed marked emphysema w/ blebs, & thor spondylosis... ~  Office spirometry 11/05 showed FEV1=1.57 (48%)... ~  PFT 4/10 showed FVC= 3.00 (77%), FEV1= 1.37 (45%), %1sec= 46, mid-flows= 16% pred... ~  CXR 4/10 showed COPD/ emphysema, NAD.Marland KitchenMarland Kitchen ~  yearly f/u CXR 4/11 showed COPD/ E, calcif Thor Ao, prom pulm arts, DJD in spine... ~  4/12:  CXR showed COPD/ E, NAD; min cough & beige phlegm, no recent infections; chr stable DOE w/o change.  HYPERTENSION, BORDERLINE (ICD-401.9) - on MICARDIS/Hct 40-12.5 daily w/  good control of BP...  CEREBROVASCULAR DISEASE (ICD-437.9) - takes PLAVIX 75mg /d & refuses to take ASA 81mg  due to some minor bruising from thin skin- we discussed this and I rec (again) that he take the ASA daily.. ~  MRI Br 11/06 shows mod atrophy, extensive chr microvasc ischemia... 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  HYPERLIPIDEMIA (ICD-272.4) - on SIMVASTATIN 40mg /d + Fish Oil... ~  FLP 4/09 on Vytor10-40 showed TChol 116, TG 184, HDL 36, LDL 44...rec- continue same. ~  [facility] required change to Simvastatin 2010 & started on 40mg /d. ~  FLP 4/10 showed TChol 136, TG 208, HDL 36, LDL 65... rec> may need fibarte, get wt down first! ~  FLP 4/11 showed TChol 149, TG 203, HDL 41, LDL  83 ~  FLP 4/12 showed TChol 141, TG 132, HDL 43, LDL 72... Continue Simva40, FishOil, diet...  OVERWEIGHT (ICD-278.02) - weight stable at ~200#... discussed diet + exercise program...  HIATAL HERNIA (ICD-553.3) - he denies reflux symptoms, trouble swallowing, etc...  DIVERTICULOSIS OF COLON (ICD-562.10) - he uses Metamucil daily & doing well. COLONIC POLYPS (ICD-211.3) - last colonoscopy 7/07 by DrSam showed divertics, hems, polyps...  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 he continues regular GU follow up w/ DrOttelin yearly in the fall... ~  labs 4/09 showed BUN= 22, Creat= 1.4, PSA's= per DrOttelin... ~  labs 4/10 showed BUN= 22, Creat= 1.3, K= 4.6 ~  labs 4/11 showed BUN= 19, Creat= 1.3 ~  Labs 4/12 showed BUN= 27, Creat= 1.3, K= 4.8, PSA= 1.40  DEGENERATIVE JOINT DISEASE (ICD-715.90) - he uses tylenol Prn... ~  labs 4/10 showed Vit D level = 36... rec> 1000 u/d OTC  Health Maintenance: ~  colonoscopy 7/07 by DrSamLeB showed divertics, hems, +polyps ~  GU: followed by DrOttelin & seen each fall- neg cysto & he does his PSAs... ~  Immunizations: had PNEUMOVAX in 77 @ age 76... OK TDAP 4/11... he gets yearly Flu vaccine each fall.   Past Surgical History  Procedure Date  . Hernia repair   . Nephroureterectomy     Outpatient Encounter Prescriptions as of 07/12/2011  Medication Sig Dispense Refill  . acetaminophen (TYLENOL ARTHRITIS PAIN) 650 MG CR tablet Take 650 mg by mouth daily.        . Cholecalciferol (VITAMIN D3) 1000 UNITS CAPS Take by mouth.        . clopidogrel (PLAVIX) 75 MG tablet Take 1 tablet (75 mg total) by mouth daily.  30 tablet  3  . fish oil-omega-3 fatty acids 1000 MG capsule Take 2 g by mouth 2 (two) times daily.        . Fluticasone-Salmeterol (ADVAIR DISKUS)  250-50 MCG/DOSE AEPB Inhale 1 puff into the lungs every 12 (twelve) hours.  60 each  6  . guaiFENesin (MUCINEX) 600 MG 12 hr tablet Take 1,200 mg by mouth 2 (two) times daily.        Marland Kitchen MICARDIS HCT 40-12.5 MG per tablet Take one (1) tablet(s) once daily  30 tablet  5  . Multiple Vitamins-Minerals (CENTRUM SILVER PO) Take 1 capsule by mouth daily.        . psyllium (METAMUCIL) 58.6 % powder Take 1 packet by mouth daily.        . simvastatin (ZOCOR) 40 MG tablet Take 1 tablet (  40 mg total) by mouth at bedtime.  30 tablet  6  . tiotropium (SPIRIVA) 18 MCG inhalation capsule Place 18 mcg into inhaler and inhale daily.        Marland Kitchen DISCONTD: diclofenac (VOLTAREN) 75 MG EC tablet Take 1 tablet (75 mg total) by mouth 2 (two) times daily with a meal.  60 tablet  5    Allergies  Allergen Reactions  . Sulfamethoxazole     REACTION: "washes" him out per pt    Review of Systems       See HPI - all other systems neg except as noted...      The patient complains of decreased hearing and dyspnea on exertion.  The patient denies anorexia, fever, weight loss, weight gain, vision loss, hoarseness, chest pain, syncope, peripheral edema, prolonged cough, headaches, hemoptysis, abdominal pain, melena, hematochezia, severe indigestion/heartburn, hematuria, incontinence, muscle weakness, suspicious skin lesions, transient blindness, difficulty walking, depression, unusual weight change, abnormal bleeding, enlarged lymph nodes, and angioedema.      Objective:   Physical Exam      WD, Overweight, 77 y/o WM in NAD... GENERAL:  Alert & oriented; pleasant & cooperative... HEENT:  [place]/AT, EOM-wnl, PERRLA, EACs-clear, TMs-wnl, NOSE-clear, THROAT-clear & wnl. NECK:  Supple w/ fairROM; no JVD; normal carotid impulses w/o bruits; no thyromegaly or nodules palpated; no lymphadenopathy. CHEST:  Decr BS bilat, clear to P & A; without wheezes/ rales/ or rhonchi heard... HEART:  Regular Rhythm; distant HS, without murmurs/ rubs/  or gallops detected... ABDOMEN:  Soft & nontender; normal bowel sounds; no organomegaly or masses palpated... EXT: without deformities, mild arthritic changes; no varicose veins/ +venous insuffic/ no edema. NEURO:  CN's intact;  no focal neuro deficits... DERM:  No lesions noted; no rash x few ecchymoses...  RADIOLOGY DATA:  Reviewed in the EPIC EMR & discussed w/ the patient...  LABORATORY DATA:  Reviewed in the EPIC EMR & discussed w/ the patient...   Assessment & Plan:   COPD/ OSA>  Stable on CPAP, O2, Advair, Spiriva, Mucinex;  Continue same Rx + regular exercise program...  HBP>  Controlled on the Micardis/HCT, continue same, needs better diet- no slat, get wt down...  Cerebrovasc dis>  Stable on the Plavix, (requested to try the 81mg  ASA again as well but he refuses due to bruising).  Hyperlipid>  Stable on the Simva & Fish Oil...  GU>  Hx both kidney & bladder cancers followed by DrOttelin yearly & stable w/o signs recurrence...  DJD>  He has some active ankle pain w/ XRays by [facility] showing arthritis by his report & Rx w/ OTC anti-inflamm meds prn.Marland KitchenMarland Kitchen

## 2011-07-13 ENCOUNTER — Encounter: Payer: Self-pay | Admitting: Pulmonary Disease

## 2011-11-29 ENCOUNTER — Ambulatory Visit (INDEPENDENT_AMBULATORY_CARE_PROVIDER_SITE_OTHER)
Admission: RE | Admit: 2011-11-29 | Discharge: 2011-11-29 | Disposition: A | Discharge status: Home / Self Care | Payer: TRICARE For Life (TFL) | Source: Ambulatory Visit | Attending: Adult Health | Admitting: Adult Health

## 2011-11-29 ENCOUNTER — Telehealth: Payer: Self-pay | Admitting: Pulmonary Disease

## 2011-11-29 ENCOUNTER — Ambulatory Visit (INDEPENDENT_AMBULATORY_CARE_PROVIDER_SITE_OTHER): Payer: TRICARE For Life (TFL) | Admitting: Adult Health

## 2011-11-29 ENCOUNTER — Encounter: Payer: Self-pay | Admitting: Adult Health

## 2011-11-29 VITALS — BP 130/86 | HR 72 | Temp 96.9°F | Ht 70.0 in | Wt 202.4 lb

## 2011-11-29 DIAGNOSIS — J441 Chronic obstructive pulmonary disease with (acute) exacerbation: Secondary | ICD-10-CM

## 2011-11-29 DIAGNOSIS — Z0000 Encounter for general adult medical examination without abnormal findings: Secondary | ICD-10-CM

## 2011-11-29 MED ORDER — MOXIFLOXACIN HCL 400 MG PO TABS: 400.0000 mg | ORAL_TABLET | Freq: Every day | ORAL | Status: DC

## 2011-11-29 MED ORDER — PREDNISONE 10 MG PO TABS: ORAL_TABLET | ORAL | Status: DC

## 2011-11-29 NOTE — Assessment & Plan Note (Signed)
Exacerbation   Plan:  Avelox 400mg  daily for 7 days -take with food  Mucinex DM Twice daily  As needed  Cough/congestion Prednisone taper over next week.  Fluids and rest  I will call with xray  results.  Follow up Dr. Vassie Loll  In 2 months in [facility] office

## 2011-11-29 NOTE — Telephone Encounter (Signed)
Pt c/o increased chest congestion, pain on his left side when he coughs, productive cough with brown phlegm all x5 days. He states it started as a head cold, and has moved to his chest. Pt request and appt. Pt set to see TP today at 2:30. Carron Curie, CMA

## 2011-11-29 NOTE — Progress Notes (Signed)
Subjective:    Patient ID: Tracy [name], male    DOB: [birth year], 78 y.o.   MRN: [id number]  HPI 78 y/o WM here with known hx of   OSA;  COPD;  HBP;  Cerebrovasc dis;  Hyperlipidemia;  HH/ Divertics/ Colon polyps;  Kidney cancer & Bladder cancer;  DJD...  ~  July 06, 2010:  he reports a good 23mo- he had his yearly f/u DrOttelin w/ cysto neg & doing well by his hx... denies resp exac;  BP remains controlled on meds;  no cerebral ischemic symptoms;  he is not fasting today & labs from 4/11 reviewed & looked good... OK Flu shot today...  ~  January 11, 2011:  He went to the ER 1/12 w/ right sided abd pain> eval revealed right rectus musc hematoma, no known trauma, Hg16, subseq ecchymosis & resolved over time...  He states breathing OK, BP well controlled, no cerebral ischemic symptoms, due for fasting labs (see below);  He's had some left ankle pain/ tendonitis & we discussed Diclofenac trial...  ~  July 12, 2011:  23mo ROV> he's been stable at 78 y/o, no new complaints or concerns, requests refills & Flu shot today...    OSA> on CPAP nightly; doing well w/o snoring reported or daytime hypersomnolence...    COPD> on Home O2, Advair, Spiriva, Mucinex; he plays golf 2x per week; denies cough, sputum, ch in baseline dyspnea, etc...    HBP> on MicardisHCT; BP= 138/70 & he denies HA, CP, palpit, ch in dyspnea, edema, etc...    Cerebrovasc dis> on Plavix; he refuses to take ASA due to bruising; no cerebral ischemic symptoms...    Hyperlipid> on Simva40 + FishOil; we reviewed his yearly labs from 4/12; continue same meds + diet...    Overweight> weight= 198# about the same; we discussed weight reduction...    GI> HH, Divertics, Polyps> on Metamucil regularly; he denies abd pain, N/V/D/C/ or blood seen; last colon was 2007 as noted...    GU> Bladder Ca & left Kidney Ca> followed by DrOttelin; pt reports seen 2wks ago & "everything was fine" w/ neg cysto & f/u planned 106yr.    DJD> off prev Voltaren Rx &  using OTC Tylenol arthritis prn; he reports seeing Ortho in [place], [facility], w/ XRay of ankle showing arthritis & Rx OTC meds...  11/29/2011 Acute OV  Complains of 1 week of cough, congestion , nasal drainage and wheezing over last week.  On arrival today,O2 was partially off and on pulsing device -Sats were 82%. Once O2 placed and at rest, Sats improved to 94%. OTC not working.  No hemotpysis or chest pain. Pain in ribs on left with coughing.  Requests to be seen at our [facility] location to establish with pulmonary at this office due to difficulty traveling to [place]. This is much closer to his home. Dr. Kriste Basque  Aware and okay with this decision.  Also wants to establish at with the PCP at this office as well.        Problem List:  OBSTRUCTIVE SLEEP APNEA (ICD-327.23) - on CPAP Qhs & using it regularly, doing well...  ~  sleep study 3/06 w/ RDI 26 & mod snoring w/ desat to 76%...  ~  Stable on CPAP nightly w/ improved snoring & no daytime symptoms...  COPD (ICD-496) - on home O2 (using it more regularly now), ADVAIR 250Bid, SPIRIVA 1/d, MUCINEX 1Bid... ~  baseline CXR shows COPD, NAD... ~  PFT 11/04 showed FVC=4.51 (103%),  FEV1=1.93 (67%), %1sec=43, mid-flows=18%... LV's w/ some air trapping and DLCO~50%...  ~  CT Chest 9/05 showed marked emphysema w/ blebs, & thor spondylosis... ~  Office spirometry 11/05 showed FEV1=1.57 (48%)... ~  PFT 4/10 showed FVC= 3.00 (77%), FEV1= 1.37 (45%), %1sec= 46, mid-flows= 16% pred... ~  CXR 4/10 showed COPD/ emphysema, NAD.Marland KitchenMarland Kitchen ~  yearly f/u CXR 4/11 showed COPD/ E, calcif Thor Ao, prom pulm arts, DJD in spine... ~  4/12:  CXR showed COPD/ E, NAD; min cough & beige phlegm, no recent infections; chr stable DOE w/o change.  HYPERTENSION, BORDERLINE (ICD-401.9) - on MICARDIS/Hct 40-12.5 daily w/ good control of BP...  CEREBROVASCULAR DISEASE (ICD-437.9) - takes PLAVIX 75mg /d & refuses to take ASA 81mg  due to some minor bruising from thin skin- we discussed this  and I rec (again) that he take the ASA daily.. ~  MRI Br 11/06 shows mod atrophy, extensive chr microvasc ischemia... 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  HYPERLIPIDEMIA (ICD-272.4) - on SIMVASTATIN 40mg /d + Fish Oil... ~  FLP 4/09 on Vytor10-40 showed TChol 116, TG 184, HDL 36, LDL 44...rec- continue same. ~  [facility] required change to Simvastatin 2010 & started on 40mg /d. ~  FLP 4/10 showed TChol 136, TG 208, HDL 36, LDL 65... rec> may need fibarte, get wt down first! ~  FLP 4/11 showed TChol 149, TG 203, HDL 41, LDL 83 ~  FLP 4/12 showed TChol 141, TG 132, HDL 43, LDL 72... Continue Simva40, FishOil, diet...  OVERWEIGHT (ICD-278.02) - weight stable at ~200#... discussed diet + exercise program...  HIATAL HERNIA (ICD-553.3) - he denies reflux symptoms, trouble swallowing, etc...  DIVERTICULOSIS OF COLON (ICD-562.10) - he uses Metamucil daily & doing well. COLONIC POLYPS (ICD-211.3) - last colonoscopy 7/07 by DrSam showed divertics, hems, polyps...  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 he continues regular GU follow up w/ DrOttelin yearly in the fall... ~  labs 4/09 showed BUN= 22, Creat= 1.4, PSA's= per DrOttelin... ~  labs 4/10 showed BUN= 22, Creat= 1.3, K= 4.6 ~  labs 4/11 showed BUN= 19, Creat= 1.3 ~  Labs 4/12 showed BUN= 27, Creat= 1.3, K= 4.8, PSA= 1.40  DEGENERATIVE JOINT DISEASE (ICD-715.90) - he uses tylenol Prn... ~  labs 4/10 showed Vit D level = 36... rec> 1000 u/d OTC  Health Maintenance: ~  colonoscopy 7/07 by DrSamLeB showed divertics, hems, +polyps ~  GU: followed by DrOttelin & seen each fall- neg cysto & he does his PSAs... ~  Immunizations: had PNEUMOVAX in 83 @ age 63... OK TDAP 4/11... he gets yearly Flu vaccine each fall.   Past Surgical History    Procedure Date  . Hernia repair   . Nephroureterectomy     Outpatient Encounter Prescriptions as of 11/29/2011  Medication Sig Dispense Refill  . acetaminophen (TYLENOL ARTHRITIS PAIN) 650 MG CR tablet Take 650 mg by mouth daily.        . Cholecalciferol (VITAMIN D3) 1000 UNITS CAPS Take by mouth.        . clopidogrel (PLAVIX) 75 MG tablet Take 1 tablet (75 mg total) by mouth daily.  30 tablet  11  . fish oil-omega-3 fatty acids 1000 MG capsule Take 2 g by mouth 2 (two) times daily.        . Fluticasone-Salmeterol (ADVAIR DISKUS) 250-50  MCG/DOSE AEPB Inhale 1 puff into the lungs every 12 (twelve) hours.  60 each  11  . guaiFENesin (MUCINEX) 600 MG 12 hr tablet Take 1,200 mg by mouth 2 (two) times daily.        . Multiple Vitamins-Minerals (CENTRUM SILVER PO) Take 1 capsule by mouth daily.        . psyllium (METAMUCIL) 58.6 % powder Take 1 packet by mouth daily.        . simvastatin (ZOCOR) 40 MG tablet Take 1 tablet (40 mg total) by mouth at bedtime.  30 tablet  11  . telmisartan-hydrochlorothiazide (MICARDIS HCT) 40-12.5 MG per tablet Take 1 tablet by mouth daily.  30 tablet  11  . tiotropium (SPIRIVA) 18 MCG inhalation capsule Place 1 capsule (18 mcg total) into inhaler and inhale daily.  30 capsule  11    Allergies  Allergen Reactions  . Sulfamethoxazole     REACTION: "washes" him out per pt    Review of Systems Constitutional:   No  weight loss, night sweats,  Fevers, chills,  +fatigue, or  lassitude.  HEENT:   No headaches,  Difficulty swallowing,  Tooth/dental problems, or  Sore throat,                No sneezing, itching, ear ache, + nasal congestion, post nasal drip,   CV:  No chest pain,  Orthopnea, PND, swelling in lower extremities, anasarca, dizziness, palpitations, syncope.   GI  No heartburn, indigestion, abdominal pain, nausea, vomiting, diarrhea, change in bowel habits, loss of appetite, bloody stools.   Resp:  No chest wall deformity  Skin: no rash or  lesions.  GU: no dysuria, change in color of urine, no urgency or frequency.  No flank pain, no hematuria   MS:  No joint pain or swelling.  No decreased range of motion.  No back pain.  Psych:  No change in mood or affect. No depression or anxiety.  No memory loss.              Objective:   Physical Exam      WD, Overweight, 78 y/o WM in NAD... GENERAL:  Alert & oriented; pleasant & cooperative... HEENT:  [place]/AT,   EACs-clear, TMs-wnl, NOSE-clear, THROAT-clear & wnl. NECK:  Supple w/ fairROM; no JVD; normal carotid impulses w/o bruits; no thyromegaly or nodules palpated; no lymphadenopathy. CHEST:  Coarse BS w/ exp wheezing  HEART:  Regular Rhythm; distant HS, without murmurs/ rubs/ or gallops detected... ABDOMEN:  Soft & nontender; normal bowel sounds; no organomegaly or masses palpated...obese  EXT: without deformities, mild arthritic changes; no varicose veins/ +venous insuffic/ no edema. NEURO:   no focal neuro deficits... DERM:  No lesions noted;      Assessment & Plan:

## 2011-11-29 NOTE — Progress Notes (Signed)
Addended by: Boone Master E on: 11/29/2011 03:29 PM   Modules accepted: Orders

## 2011-11-29 NOTE — Telephone Encounter (Signed)
Allowed to ring multiple times and no answer will need to try again later.

## 2011-11-29 NOTE — Patient Instructions (Signed)
Avelox 400mg  daily for 7 days -take with food  Mucinex DM Twice daily  As needed  Cough/congestion Prednisone taper over next week.  Fluids and rest  I will call with xray  results.  Follow up Dr. Vassie Loll  In 2 months in [facility] office  Please contact office for sooner follow up if symptoms do not improve or worsen or seek emergency care

## 2011-12-06 ENCOUNTER — Telehealth: Payer: Self-pay | Admitting: Pulmonary Disease

## 2011-12-06 ENCOUNTER — Ambulatory Visit: Payer: Medicare Other | Admitting: Internal Medicine

## 2011-12-06 NOTE — Telephone Encounter (Signed)
Pt informed of cxr results per Tammy Parrett.

## 2011-12-07 ENCOUNTER — Encounter: Payer: Self-pay | Admitting: Internal Medicine

## 2011-12-07 ENCOUNTER — Ambulatory Visit (INDEPENDENT_AMBULATORY_CARE_PROVIDER_SITE_OTHER): Payer: 59 | Admitting: Internal Medicine

## 2011-12-07 DIAGNOSIS — J441 Chronic obstructive pulmonary disease with (acute) exacerbation: Secondary | ICD-10-CM

## 2011-12-07 DIAGNOSIS — I679 Cerebrovascular disease, unspecified: Secondary | ICD-10-CM

## 2011-12-07 DIAGNOSIS — I10 Essential (primary) hypertension: Secondary | ICD-10-CM

## 2011-12-07 DIAGNOSIS — E785 Hyperlipidemia, unspecified: Secondary | ICD-10-CM

## 2011-12-07 DIAGNOSIS — R109 Unspecified abdominal pain: Secondary | ICD-10-CM

## 2011-12-07 MED ORDER — MOXIFLOXACIN HCL 400 MG PO TABS: 400.0000 mg | ORAL_TABLET | Freq: Every day | ORAL | Status: DC

## 2011-12-07 MED ORDER — METHYLPREDNISOLONE 4 MG PO KIT: PACK | ORAL | Status: AC

## 2011-12-07 NOTE — Assessment & Plan Note (Signed)
Asx. Continue plavix qd. Obtain cbc due to plavix use

## 2011-12-07 NOTE — Progress Notes (Signed)
  Subjective:    Patient ID: Tracy [name], male    DOB: October 30, 1925, 78 y.o.   MRN: [id number]  HPI Pt presents to clinic for followup of multiple medical problems. H/o copd followed by pulmonary with recent exacerbation. Notes 80% better after avelox x7d and prednisone taper. 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 Reports s/p left nephrectomy with subsequent left lateral abd wall hernia repair with mesh. Since coughing has noted pain in the same area. Pain only occurs with cough and feels no bulge. Tolerates plavix without gross active bleeding and denies stroke/neurologic sx's. BP reviewed normotensive. No other complaints.  Past Medical History  Diagnosis Date  . Obstructive sleep apnea (adult) (pediatric)   . Chronic airway obstruction, not elsewhere classified   . Unspecified essential hypertension   . Cerebrovascular disease, unspecified   . Other and unspecified hyperlipidemia   . Diaphragmatic hernia without mention of obstruction or gangrene   . Diverticulosis of colon (without mention of hemorrhage)   . Benign neoplasm of colon   . Overweight    Past Surgical History  Procedure Date  . Hernia repair   . Nephroureterectomy     reports that he quit smoking about 33 years ago. His smoking use included Cigarettes. He smoked 1.5 packs per day. He has never used smokeless tobacco. He reports that he does not drink alcohol or use illicit drugs. family history is not on file. Allergies  Allergen Reactions  . Sulfamethoxazole     REACTION: "washes" him out per pt      Review of Systems  Constitutional: Negative for fever and chills.  Respiratory: Positive for cough. Negative for shortness of breath and wheezing.   Gastrointestinal: Positive for abdominal pain. Negative for blood in stool.  All other systems reviewed and are negative.       Objective:   Physical Exam  Nursing note and vitals reviewed. Constitutional: He appears well-developed and  well-nourished. No distress.  HENT:  Head: Normocephalic and atraumatic.  Right Ear: External ear normal.  Left Ear: External ear normal.  Eyes: Conjunctivae are normal. No scleral icterus.  Neck: Neck supple. No JVD present. Carotid bruit is not present.  Cardiovascular: Normal rate, regular rhythm and normal heart sounds.  Exam reveals no gallop and no friction rub.   No murmur heard. Pulmonary/Chest: Effort normal and breath sounds normal. No respiratory distress. He has no wheezes. He has no rales.  Abdominal: Soft. Bowel sounds are normal. He exhibits no distension and no mass. There is no tenderness. There is no rebound and no guarding.  Neurological: He is alert.  Skin: Skin is warm and dry. He is not diaphoretic.  Psychiatric: He has a normal mood and affect.          Assessment & Plan:

## 2011-12-07 NOTE — Assessment & Plan Note (Signed)
Improving. Discussion observation versus further tx. Given prescription for avelox and medrol. Pt states will hold for 2-3 days and take medication if no further improvement. Followup if no improvement or worsening.

## 2011-12-07 NOTE — Assessment & Plan Note (Signed)
Occurs with coughing only. abd exam nl. No obvious hernia on exam. Suspect muscle pain. Followup if no improvement or worsening.

## 2011-12-07 NOTE — Patient Instructions (Signed)
Please return to lab Thursday morning fasting for cbc, chem7 -v58.69, lipid/lft-272.4 and vitamin d (vit d deficiency) Also please schedule fasting labs prior to next visit- same

## 2011-12-07 NOTE — Assessment & Plan Note (Signed)
Maintain statin tx. Obtain lipid/lft 

## 2011-12-07 NOTE — Assessment & Plan Note (Signed)
Normotensive and stable. Continue current regimen. Monitor bp as outpt and followup in clinic as scheduled. Obtain chem7 

## 2011-12-09 ENCOUNTER — Other Ambulatory Visit: Payer: Self-pay | Admitting: Internal Medicine

## 2011-12-09 DIAGNOSIS — Z79899 Other long term (current) drug therapy: Secondary | ICD-10-CM

## 2011-12-09 DIAGNOSIS — E785 Hyperlipidemia, unspecified: Secondary | ICD-10-CM

## 2011-12-09 LAB — CBC
Platelets: 198 10*3/uL (ref 150–400)
RDW: 15.1 % (ref 11.5–15.5)
WBC: 10.2 10*3/uL (ref 4.0–10.5)

## 2011-12-10 LAB — LIPID PANEL
HDL: 44 mg/dL (ref >39)
LDL Cholesterol: 63 mg/dL (ref 0–99)
Triglycerides: 263 mg/dL — ABNORMAL HIGH (ref <150)
VLDL: 53 mg/dL — ABNORMAL HIGH (ref 0–40)

## 2011-12-10 LAB — HEPATIC FUNCTION PANEL
Albumin: 4.3 g/dL (ref 3.5–5.2)
Alkaline Phosphatase: 56 U/L (ref 39–117)
Total Protein: 6.6 g/dL (ref 6.0–8.3)

## 2011-12-10 LAB — BASIC METABOLIC PANEL
Chloride: 100 mEq/L (ref 96–112)
Potassium: 4.9 mEq/L (ref 3.5–5.3)

## 2011-12-11 ENCOUNTER — Encounter (HOSPITAL_BASED_OUTPATIENT_CLINIC_OR_DEPARTMENT_OTHER): Payer: Self-pay

## 2011-12-11 ENCOUNTER — Emergency Department (INDEPENDENT_AMBULATORY_CARE_PROVIDER_SITE_OTHER): Payer: 59

## 2011-12-11 ENCOUNTER — Emergency Department (HOSPITAL_BASED_OUTPATIENT_CLINIC_OR_DEPARTMENT_OTHER)
Admission: EM | Admit: 2011-12-11 | Discharge: 2011-12-11 | Disposition: A | Discharge status: Home / Self Care | Payer: 59 | Attending: Emergency Medicine | Admitting: Emergency Medicine

## 2011-12-11 DIAGNOSIS — W19XXXA Unspecified fall, initial encounter: Secondary | ICD-10-CM

## 2011-12-11 DIAGNOSIS — Y92009 Unspecified place in unspecified non-institutional (private) residence as the place of occurrence of the external cause: Secondary | ICD-10-CM | POA: Insufficient documentation

## 2011-12-11 DIAGNOSIS — J449 Chronic obstructive pulmonary disease, unspecified: Secondary | ICD-10-CM | POA: Insufficient documentation

## 2011-12-11 DIAGNOSIS — Z79899 Other long term (current) drug therapy: Secondary | ICD-10-CM | POA: Insufficient documentation

## 2011-12-11 DIAGNOSIS — E785 Hyperlipidemia, unspecified: Secondary | ICD-10-CM | POA: Insufficient documentation

## 2011-12-11 DIAGNOSIS — S6990XA Unspecified injury of unspecified wrist, hand and finger(s), initial encounter: Secondary | ICD-10-CM | POA: Insufficient documentation

## 2011-12-11 DIAGNOSIS — I10 Essential (primary) hypertension: Secondary | ICD-10-CM | POA: Insufficient documentation

## 2011-12-11 DIAGNOSIS — S61419A Laceration without foreign body of unspecified hand, initial encounter: Secondary | ICD-10-CM

## 2011-12-11 DIAGNOSIS — S61409A Unspecified open wound of unspecified hand, initial encounter: Secondary | ICD-10-CM | POA: Insufficient documentation

## 2011-12-11 DIAGNOSIS — G4733 Obstructive sleep apnea (adult) (pediatric): Secondary | ICD-10-CM | POA: Insufficient documentation

## 2011-12-11 DIAGNOSIS — M949 Disorder of cartilage, unspecified: Secondary | ICD-10-CM

## 2011-12-11 DIAGNOSIS — J4489 Other specified chronic obstructive pulmonary disease: Secondary | ICD-10-CM | POA: Insufficient documentation

## 2011-12-11 DIAGNOSIS — Z8673 Personal history of transient ischemic attack (TIA), and cerebral infarction without residual deficits: Secondary | ICD-10-CM | POA: Insufficient documentation

## 2011-12-11 MED ORDER — TETANUS-DIPHTH-ACELL PERTUSSIS 5-2.5-18.5 LF-MCG/0.5 IM SUSP: 0.5000 mL | Freq: Once | INTRAMUSCULAR | Status: DC

## 2011-12-11 MED ORDER — LIDOCAINE HCL 2 % IJ SOLN
10.0000 mL | Freq: Once | INTRAMUSCULAR | Status: AC
  Administered 2011-12-11: 200 mg via INTRADERMAL

## 2011-12-11 MED ORDER — HYDROCODONE-ACETAMINOPHEN 5-325 MG PO TABS: 2.0000 | ORAL_TABLET | ORAL | Status: DC | PRN

## 2011-12-11 MED ORDER — LIDOCAINE HCL 2 % IJ SOLN
INTRAMUSCULAR | Status: AC
  Administered 2011-12-11: 200 mg via INTRADERMAL
  Filled 2011-12-11: qty 1

## 2011-12-11 NOTE — ED Provider Notes (Signed)
Medical screening examination/treatment/procedure(s) were performed by non-physician practitioner and as supervising physician I was immediately available for consultation/collaboration.   Namiah Dunnavant A. Patrica Duel, MD 12/11/11 2356

## 2011-12-11 NOTE — ED Notes (Signed)
Suture Cart placed at bedside and set up for [employer] PA. The patient hand was unwrapped and prepared  For sutures.

## 2011-12-11 NOTE — ED Provider Notes (Signed)
History     CSN: [id number]  Arrival date & time 12/11/11  1757   None     Chief Complaint  Patient presents with  . Extremity Laceration    (Consider location/radiation/quality/duration/timing/severity/associated sxs/prior treatment) Patient is a 78 y.o. male presenting with hand injury. The history is provided by the patient. No language interpreter was used.  Hand Injury  The incident occurred less than 1 hour ago. The incident occurred at home. The injury mechanism was a fall. The pain is present in the left hand. The quality of the pain is described as aching. The pain is at a severity of 5/10. The pain is moderate. The pain has been constant since the incident. Pertinent negatives include no fever. He reports no foreign bodies present. He has tried nothing for the symptoms. The treatment provided moderate relief.  Pt reports he fell and injured his left hand.  Pt complains of a laceration.  Pt is on coumadin.  Pt denies any impact of his head  Past Medical History  Diagnosis Date  . Obstructive sleep apnea (adult) (pediatric)   . Chronic airway obstruction, not elsewhere classified   . Unspecified essential hypertension   . Cerebrovascular disease, unspecified   . Other and unspecified hyperlipidemia   . Diaphragmatic hernia without mention of obstruction or gangrene   . Diverticulosis of colon (without mention of hemorrhage)   . Benign neoplasm of colon   . Overweight     Past Surgical History  Procedure Date  . Hernia repair   . Nephroureterectomy     History reviewed. No pertinent family history.  History  Substance Use Topics  . Smoking status: Former Smoker -- 1.5 packs/day    Types: Cigarettes    Quit date: 09/27/1978  . Smokeless tobacco: Never Used  . Alcohol Use: No      Review of Systems  Constitutional: Negative for fever.  Skin: Positive for wound.  All other systems reviewed and are negative.    Allergies  Sulfamethoxazole  Home  Medications   Current Outpatient Rx  Name Route Sig Dispense Refill  . ACETAMINOPHEN ER 650 MG PO TBCR Oral Take 650 mg by mouth daily.      Marland Kitchen VITAMIN D3 1000 UNITS PO CAPS Oral Take by mouth.      . CLOPIDOGREL BISULFATE 75 MG PO TABS Oral Take 1 tablet (75 mg total) by mouth daily. 30 tablet 11  . OMEGA-3 FATTY ACIDS 1000 MG PO CAPS Oral Take 2 g by mouth 2 (two) times daily.      Marland Kitchen FLUTICASONE-SALMETEROL 250-50 MCG/DOSE IN AEPB Inhalation Inhale 1 puff into the lungs every 12 (twelve) hours. 60 each 11  . GUAIFENESIN ER 600 MG PO TB12 Oral Take 1,200 mg by mouth 2 (two) times daily.      . METHYLPREDNISOLONE 4 MG PO KIT  follow package directions 21 tablet 0  . MOXIFLOXACIN HCL 400 MG PO TABS Oral Take 1 tablet (400 mg total) by mouth daily. 5 tablet 0  . CENTRUM SILVER PO Oral Take 1 capsule by mouth daily.      . PSYLLIUM 58.6 % PO POWD Oral Take 1 packet by mouth daily.      Marland Kitchen SIMVASTATIN 40 MG PO TABS Oral Take 1 tablet (40 mg total) by mouth at bedtime. 30 tablet 11  . TELMISARTAN-HCTZ 40-12.5 MG PO TABS Oral Take 1 tablet by mouth daily. 30 tablet 11  . TIOTROPIUM BROMIDE MONOHYDRATE 18 MCG IN CAPS Inhalation Place 1  capsule (18 mcg total) into inhaler and inhale daily. 30 capsule 11    BP 156/87  Pulse 85  Temp(Src) 98.8 F (37.1 C) (Oral)  Resp 20  Ht 5\' 10"  (1.778 m)  Wt 198 lb (89.812 kg)  BMI 28.41 kg/m2  SpO2 90%  Physical Exam  Vitals reviewed. Constitutional: He is oriented to person, place, and time. He appears well-developed and well-nourished.  HENT:  Head: Normocephalic and atraumatic.  Eyes: Pupils are equal, round, and reactive to light.  Neck: Normal range of motion.  Musculoskeletal: He exhibits tenderness.       Large skin tear flaps on hypothenar area,  Skin is shaved away.  Skin flap at base of 5th finger,  With 2 cm open area  Neurological: He is alert and oriented to person, place, and time. He has normal reflexes.  Psychiatric: He has a normal  mood and affect.    ED Course  LACERATION REPAIR Date/Time: 12/11/2011 8:48 PM Performed by: Elson Areas Authorized by: Elson Areas Consent: Verbal consent obtained. Consent given by: patient Patient identity confirmed: verbally with patient Time out: Immediately prior to procedure a "time out" was called to verify the correct patient, procedure, equipment, support staff and site/side marked as required. Body area: upper extremity Laceration length: 6 cm Tendon involvement: none Nerve involvement: none Anesthesia: local infiltration Local anesthetic: lidocaine 2% without epinephrine Preparation: Patient was prepped and draped in the usual sterile fashion. Irrigation solution: saline Amount of cleaning: standard Debridement: none Skin closure: glue, 4-0 Prolene and 5-0 Prolene Number of sutures: 6 Technique: simple Comments: I dermabonded skin tear area and sutured deep areas,     (including critical care time)  Labs Reviewed - No data to display No results found.   No diagnosis found.    MDM  Pt advised recheck here on Monday        Lonia Skinner [place], [place] 12/11/11 2050  Lonia Skinner [place], [place] 12/11/11 2050

## 2011-12-11 NOTE — ED Notes (Signed)
On arrival, patients soiled dressing removed. RN Ronaldo Miyamoto and RN Lorin Picket to look at patients laceration and EDP made aware. Clean, dry pressure dressing reapplied.

## 2011-12-11 NOTE — Discharge Instructions (Signed)

## 2011-12-11 NOTE — ED Notes (Signed)
Pt was walking down a sidewalk and fell, suffering a laceration to the L hand, skin tear, continues to bleed, pt on blood thinners

## 2011-12-13 ENCOUNTER — Encounter (HOSPITAL_BASED_OUTPATIENT_CLINIC_OR_DEPARTMENT_OTHER): Payer: Self-pay | Admitting: *Deleted

## 2011-12-13 ENCOUNTER — Emergency Department (HOSPITAL_BASED_OUTPATIENT_CLINIC_OR_DEPARTMENT_OTHER)
Admission: EM | Admit: 2011-12-13 | Discharge: 2011-12-13 | Disposition: A | Discharge status: Home / Self Care | Payer: 59 | Attending: Emergency Medicine | Admitting: Emergency Medicine

## 2011-12-13 ENCOUNTER — Telehealth: Payer: Self-pay | Admitting: Internal Medicine

## 2011-12-13 DIAGNOSIS — Z79899 Other long term (current) drug therapy: Secondary | ICD-10-CM | POA: Insufficient documentation

## 2011-12-13 DIAGNOSIS — IMO0002 Reserved for concepts with insufficient information to code with codable children: Secondary | ICD-10-CM

## 2011-12-13 DIAGNOSIS — E663 Overweight: Secondary | ICD-10-CM | POA: Insufficient documentation

## 2011-12-13 DIAGNOSIS — J4489 Other specified chronic obstructive pulmonary disease: Secondary | ICD-10-CM | POA: Insufficient documentation

## 2011-12-13 DIAGNOSIS — S61409A Unspecified open wound of unspecified hand, initial encounter: Secondary | ICD-10-CM | POA: Insufficient documentation

## 2011-12-13 DIAGNOSIS — J449 Chronic obstructive pulmonary disease, unspecified: Secondary | ICD-10-CM | POA: Insufficient documentation

## 2011-12-13 DIAGNOSIS — M899 Disorder of bone, unspecified: Secondary | ICD-10-CM | POA: Insufficient documentation

## 2011-12-13 DIAGNOSIS — T148XXA Other injury of unspecified body region, initial encounter: Secondary | ICD-10-CM

## 2011-12-13 DIAGNOSIS — W19XXXA Unspecified fall, initial encounter: Secondary | ICD-10-CM | POA: Insufficient documentation

## 2011-12-13 DIAGNOSIS — E785 Hyperlipidemia, unspecified: Secondary | ICD-10-CM | POA: Insufficient documentation

## 2011-12-13 DIAGNOSIS — Z87891 Personal history of nicotine dependence: Secondary | ICD-10-CM | POA: Insufficient documentation

## 2011-12-13 DIAGNOSIS — I10 Essential (primary) hypertension: Secondary | ICD-10-CM | POA: Insufficient documentation

## 2011-12-13 DIAGNOSIS — G4733 Obstructive sleep apnea (adult) (pediatric): Secondary | ICD-10-CM | POA: Insufficient documentation

## 2011-12-13 MED ORDER — CEPHALEXIN 500 MG PO CAPS: 500.0000 mg | ORAL_CAPSULE | Freq: Three times a day (TID) | ORAL | Status: AC

## 2011-12-13 NOTE — Telephone Encounter (Signed)
Has appt on wed for hand. Was going to discuss then. tg mildly high. hgb is higher than nl. May discuss repeat hgb at appt

## 2011-12-13 NOTE — Telephone Encounter (Signed)
Call placed to patient at [phone], he was informed per Dr Rodena Medin instructions and has verbalized understanding.

## 2011-12-13 NOTE — ED Provider Notes (Signed)
History     CSN: [id number]  Arrival date & time 12/13/11  4098   First MD Initiated Contact with Patient 12/13/11 1058      Chief Complaint  Patient presents with  . Wound Check    (Consider location/radiation/quality/duration/timing/severity/associated sxs/prior treatment) HPI Comments: Patient had a laceration to his left hand, repaired in the ED 2 days ago. He states since, then he's had some swelling and redness to his hand. Denies any drainage. His tetanus shot is up-to-date.  Patient is a 78 y.o. male presenting with wound check. The history is provided by the patient.  Wound Check  He was treated in the ED 2 to 3 days ago. Previous treatment in the ED includes laceration repair. There has been no treatment since the wound repair.    Past Medical History  Diagnosis Date  . Obstructive sleep apnea (adult) (pediatric)   . Chronic airway obstruction, not elsewhere classified   . Unspecified essential hypertension   . Cerebrovascular disease, unspecified   . Other and unspecified hyperlipidemia   . Diaphragmatic hernia without mention of obstruction or gangrene   . Diverticulosis of colon (without mention of hemorrhage)   . Benign neoplasm of colon   . Overweight     Past Surgical History  Procedure Date  . Hernia repair   . Nephroureterectomy     History reviewed. No pertinent family history.  History  Substance Use Topics  . Smoking status: Former Smoker -- 1.5 packs/day    Types: Cigarettes    Quit date: 09/27/1978  . Smokeless tobacco: Never Used  . Alcohol Use: No      Review of Systems  Constitutional: Positive for fever.  Respiratory: Negative.   Cardiovascular: Negative.   Gastrointestinal: Negative.   Musculoskeletal: Positive for joint swelling.  Skin: Positive for wound.  Neurological: Negative for weakness and numbness.    Allergies  Sulfamethoxazole  Home Medications   Current Outpatient Rx  Name Route Sig Dispense Refill  .  ACETAMINOPHEN ER 650 MG PO TBCR Oral Take 650 mg by mouth daily.      . CEPHALEXIN 500 MG PO CAPS Oral Take 1 capsule (500 mg total) by mouth 3 (three) times daily. 21 capsule 0  . VITAMIN D3 1000 UNITS PO CAPS Oral Take by mouth.      . CLOPIDOGREL BISULFATE 75 MG PO TABS Oral Take 1 tablet (75 mg total) by mouth daily. 30 tablet 11  . OMEGA-3 FATTY ACIDS 1000 MG PO CAPS Oral Take 2 g by mouth 2 (two) times daily.      Marland Kitchen FLUTICASONE-SALMETEROL 250-50 MCG/DOSE IN AEPB Inhalation Inhale 1 puff into the lungs every 12 (twelve) hours. 60 each 11  . GUAIFENESIN ER 600 MG PO TB12 Oral Take 1,200 mg by mouth 2 (two) times daily.      Marland Kitchen HYDROCODONE-ACETAMINOPHEN 5-325 MG PO TABS Oral Take 2 tablets by mouth every 4 (four) hours as needed for pain. 10 tablet 0  . METHYLPREDNISOLONE 4 MG PO KIT  follow package directions 21 tablet 0  . MOXIFLOXACIN HCL 400 MG PO TABS Oral Take 1 tablet (400 mg total) by mouth daily. 5 tablet 0  . CENTRUM SILVER PO Oral Take 1 capsule by mouth daily.      . PSYLLIUM 58.6 % PO POWD Oral Take 1 packet by mouth daily.      Marland Kitchen SIMVASTATIN 40 MG PO TABS Oral Take 1 tablet (40 mg total) by mouth at bedtime. 30 tablet 11  .  TELMISARTAN-HCTZ 40-12.5 MG PO TABS Oral Take 1 tablet by mouth daily. 30 tablet 11  . TIOTROPIUM BROMIDE MONOHYDRATE 18 MCG IN CAPS Inhalation Place 1 capsule (18 mcg total) into inhaler and inhale daily. 30 capsule 11    BP 130/81  Pulse 96  Temp(Src) 98 F (36.7 C) (Oral)  Resp 20  SpO2 90%  Physical Exam  Constitutional: He appears well-developed and well-nourished.  HENT:  Head: Normocephalic and atraumatic.  Cardiovascular: Normal rate and normal heart sounds.   Pulmonary/Chest: Effort normal and breath sounds normal.  Abdominal: Soft. Bowel sounds are normal.  Musculoskeletal:       Patient has a healing laceration to the palmar surface of his left hand. There is a skin flap along the ulnar side at the time that appears to be dusky. There is  some erythema and swelling to the dorsal side of his hand, but no extension up to the wrist. No drainage from the wound is noted. No induration or fluctuance is noted    ED Course  Procedures (including critical care time)  Labs Reviewed - No data to display Dg Hand Complete Left  12/11/2011  *RADIOLOGY REPORT*  Clinical Data: Fall.  Laceration to the left hand.  LEFT HAND - COMPLETE 3+ VIEW  Comparison: None.  Findings: Extensive soft tissue swelling is present over the ulnar aspect of the hand.  Moderate osteopenia is evident.  There is no acute fracture or radiopaque foreign body.  Advanced degenerative changes are noted at the first [facility] joint.  IMPRESSION:  1.  Extensive soft tissue swelling over the ulnar aspect of the hand without underlying fracture or radiopaque foreign body. 2.  Osteopenia. 3.  Moderate degenerative change, particularly the first [facility] joint.  Original Report Authenticated By: Jamesetta Orleans. MATTERN, M.D.     1. Laceration   2. Wound infection       MDM  Patient with wound check. The wound does appear to be getting infected, so I will start him on Keflex. There is dusky area at appears to be devascularize and may not heal appropriately. I did discuss this with the patient and that might need to heal by secondary intention. Feels that the wound needs close followup advised them to return here or followup with his doctor, who is Dr. Rodena Medin within 2 days for another wound check        Rolan Bucco, MD 12/13/11 1126

## 2011-12-13 NOTE — ED Notes (Signed)
Patient had bleed through the xeroform, Kerlix and ace wrap that was in place; dressing removed.

## 2011-12-13 NOTE — ED Notes (Signed)
Here for wound recheck  Bandages and dressings removed all had been soaked in blood through to the ace wrap

## 2011-12-13 NOTE — Discharge Instructions (Signed)
Wound Infection °A wound infection happens when a type of germ (bacteria) grows in a wound. Caring for the infection can help the wound heal. Wound infections need treatment. °HOME CARE  °· Only take medicine as told by your doctor.  °· Take your antibiotic medicine as told. Finish it even if you start to feel better.  °· Clean the wound with mild soap and water as told. Rinse the soap off. Pat the area dry with a clean towel. Do not rub the wound.  °· Change any bandages (dressings) as told by your doctor.  °· Put cream and a bandage on the wound as told by your doctor.  °· If the bandage sticks, wet it with soapy water to remove the bandage.  °· Change the bandage if it gets wet, dirty, or starts to smell.  °· Take showers. Do not take baths, swim, or do anything that puts your wound under water.  °· Avoid exercise that makes you sweat.  °· If your wound itches, use a medicine that helps stop itching. Do not pick or scratch at the wound.  °· Keep all doctor visits as told.  °GET HELP RIGHT AWAY IF:  °· You have more puffiness (swelling), pain, or redness around the wound.  °· You have more yellowish-white fluid (pus) coming from the wound.  °· You have a bad smell coming from the wound.  °· Your wound breaks open more.  °· You have a fever.  °MAKE SURE YOU:  °· Understand these instructions.  °· Will watch your condition.  °· Will get help right away if you are not doing well or get worse.  °Document Released: 06/22/2008 Document Revised: 09/02/2011 Document Reviewed: 02/22/2011 °[employer]® Patient Information ©2012 [employer]. °

## 2011-12-15 ENCOUNTER — Encounter: Payer: Self-pay | Admitting: Internal Medicine

## 2011-12-15 ENCOUNTER — Ambulatory Visit (INDEPENDENT_AMBULATORY_CARE_PROVIDER_SITE_OTHER): Payer: 59 | Admitting: Internal Medicine

## 2011-12-15 VITALS — BP 110/62 | HR 81 | Temp 97.1°F | Resp 22 | Wt 197.0 lb

## 2011-12-15 DIAGNOSIS — S61409A Unspecified open wound of unspecified hand, initial encounter: Secondary | ICD-10-CM

## 2011-12-15 DIAGNOSIS — S61419A Laceration without foreign body of unspecified hand, initial encounter: Secondary | ICD-10-CM

## 2011-12-15 NOTE — Assessment & Plan Note (Signed)
Continue abx tx. Schedule close f/u for wound recheck. Discussed potential slow healing due to tension area. Also aware of area of potential necrosis.

## 2011-12-15 NOTE — Progress Notes (Signed)
  Subjective:    Patient ID: Tracy [name], male    DOB: [birth year], 78 y.o.   MRN: [id number]  HPI Pt presents to clinic for ED follow up of laceration. Seen in ED 3/16 with left hypothenar laceration s/p fall. Received sutures and dc'ed home. Returned for ED wound check 3/18 with concern over infection due to erythema. Placed on keflex. Has mild pain controlled with tylenol. Denies fever, chills, or purulent dc. Has intact sensation and FROM. No other complaints.  Past Medical History  Diagnosis Date  . Obstructive sleep apnea (adult) (pediatric)   . Chronic airway obstruction, not elsewhere classified   . Unspecified essential hypertension   . Cerebrovascular disease, unspecified   . Other and unspecified hyperlipidemia   . Diaphragmatic hernia without mention of obstruction or gangrene   . Diverticulosis of colon (without mention of hemorrhage)   . Benign neoplasm of colon   . Overweight    Past Surgical History  Procedure Date  . Hernia repair   . Nephroureterectomy     reports that he quit smoking about 33 years ago. His smoking use included Cigarettes. He smoked 1.5 packs per day. He has never used smokeless tobacco. He reports that he does not drink alcohol or use illicit drugs. family history is not on file. Allergies  Allergen Reactions  . Sulfamethoxazole     REACTION: "washes" him out per pt      Review of Systems see hpi     Objective:   Physical Exam  Nursing note and vitals reviewed. Constitutional: He appears well-developed and well-nourished. No distress.  HENT:  Head: Normocephalic and atraumatic.  Neurological: He is alert.  Skin: Skin is warm and dry. He is not diaphoretic.       Left hand: +2 radial pulse. Sensation intact. FROM. Left hypothenar- laceration and deep abrasion noted. Skin flap of laceration with sutures line as well as distal edge appearing mildly necrotic. NT. No expressive discharge.  Psychiatric: He has a normal mood and affect.           Assessment & Plan:

## 2011-12-20 ENCOUNTER — Ambulatory Visit (INDEPENDENT_AMBULATORY_CARE_PROVIDER_SITE_OTHER): Payer: 59 | Admitting: Internal Medicine

## 2011-12-20 ENCOUNTER — Encounter: Payer: Self-pay | Admitting: Internal Medicine

## 2011-12-20 VITALS — BP 100/60 | HR 71 | Temp 97.4°F | Resp 20

## 2011-12-20 DIAGNOSIS — S61419A Laceration without foreign body of unspecified hand, initial encounter: Secondary | ICD-10-CM

## 2011-12-20 DIAGNOSIS — E785 Hyperlipidemia, unspecified: Secondary | ICD-10-CM

## 2011-12-20 DIAGNOSIS — S61409A Unspecified open wound of unspecified hand, initial encounter: Secondary | ICD-10-CM

## 2011-12-25 NOTE — Assessment & Plan Note (Signed)
Elevated tg. Continue statin tx. Recommend low fat diet.

## 2011-12-25 NOTE — Assessment & Plan Note (Signed)
Removed 3 of 7 sutures. Left 4 under higher tension. Return appt given 1wk

## 2011-12-25 NOTE — Progress Notes (Signed)
  Subjective:    Patient ID: Tracy [name], male    DOB: Feb 02, 1926, 78 y.o.   MRN: [id number]  HPI Pt presents to clinic for evaluation of hand laceration and follow up of cholesterol. Reviewed chol panel with elevated tg. Tolerates statin tx. Hand laceration without purulent drainage and denies fever or chills. Has finished abx course.   Past Medical History  Diagnosis Date  . Obstructive sleep apnea (adult) (pediatric)   . Chronic airway obstruction, not elsewhere classified   . Unspecified essential hypertension   . Cerebrovascular disease, unspecified   . Other and unspecified hyperlipidemia   . Diaphragmatic hernia without mention of obstruction or gangrene   . Diverticulosis of colon (without mention of hemorrhage)   . Benign neoplasm of colon   . Overweight    Past Surgical History  Procedure Date  . Hernia repair   . Nephroureterectomy     reports that he quit smoking about 33 years ago. His smoking use included Cigarettes. He smoked 1.5 packs per day. He has never used smokeless tobacco. He reports that he does not drink alcohol or use illicit drugs. family history is not on file. Allergies  Allergen Reactions  . Sulfamethoxazole     REACTION: "washes" him out per pt     Review of Systems see hpi     Objective:   Physical Exam  Nursing note and vitals reviewed. Constitutional: He appears well-developed and well-nourished. No distress.  HENT:  Head: Normocephalic and atraumatic.  Eyes: Conjunctivae are normal. No scleral icterus.  Neurological: He is alert.  Skin: Skin is warm and dry. He is not diaphoretic.       +eschar. No purulent drainage. Mild tenderness.FROM.  Psychiatric: He has a normal mood and affect.          Assessment & Plan:

## 2011-12-27 ENCOUNTER — Encounter: Payer: Self-pay | Admitting: Internal Medicine

## 2011-12-27 ENCOUNTER — Ambulatory Visit (INDEPENDENT_AMBULATORY_CARE_PROVIDER_SITE_OTHER): Payer: 59 | Admitting: Internal Medicine

## 2011-12-27 VITALS — BP 106/70 | HR 81 | Temp 97.5°F | Resp 20

## 2011-12-27 DIAGNOSIS — S61419A Laceration without foreign body of unspecified hand, initial encounter: Secondary | ICD-10-CM

## 2011-12-27 DIAGNOSIS — S61409A Unspecified open wound of unspecified hand, initial encounter: Secondary | ICD-10-CM

## 2011-12-27 NOTE — Progress Notes (Signed)
  Subjective:    Patient ID: Tracy [name], male    DOB: November 16, 1925, 78 y.o.   MRN: [id number]  HPI Pt presents to clinic for follow up of hand laceration. Wound improved. Still has necrotic margin at laceration edge. No drainage or f/c. Has four remaining sutures at higher tension area. No complaints.  Past Medical History  Diagnosis Date  . Obstructive sleep apnea (adult) (pediatric)   . Chronic airway obstruction, not elsewhere classified   . Unspecified essential hypertension   . Cerebrovascular disease, unspecified   . Other and unspecified hyperlipidemia   . Diaphragmatic hernia without mention of obstruction or gangrene   . Diverticulosis of colon (without mention of hemorrhage)   . Benign neoplasm of colon   . Overweight    Past Surgical History  Procedure Date  . Hernia repair   . Nephroureterectomy     reports that he quit smoking about 33 years ago. His smoking use included Cigarettes. He smoked 1.5 packs per day. He has never used smokeless tobacco. He reports that he does not drink alcohol or use illicit drugs. family history is not on file. Allergies  Allergen Reactions  . Sulfamethoxazole     REACTION: "washes" him out per pt     Review of Systems see hpi     Objective:   Physical Exam  Nursing note and vitals reviewed. Constitutional: He appears well-developed and well-nourished.  HENT:  Head: Normocephalic.  Skin: Skin is warm and dry.       Wound without drainage or erythema. Edge remains black but no progression. Minimal mobility of skin flap with manipulation.          Assessment & Plan:

## 2012-01-02 NOTE — Assessment & Plan Note (Signed)
Last four remaining sutures removed. No evidence of active infection. Reiterated concern over viability of wound edge. Discussed remaining healing by intention. Followup if no improvement or worsening.

## 2012-01-07 ENCOUNTER — Encounter: Payer: Self-pay | Admitting: Internal Medicine

## 2012-01-07 ENCOUNTER — Ambulatory Visit (INDEPENDENT_AMBULATORY_CARE_PROVIDER_SITE_OTHER): Payer: 59 | Admitting: Internal Medicine

## 2012-01-07 VITALS — BP 106/72 | HR 81 | Temp 97.9°F | Resp 22

## 2012-01-07 DIAGNOSIS — S61409A Unspecified open wound of unspecified hand, initial encounter: Secondary | ICD-10-CM

## 2012-01-07 DIAGNOSIS — S61419A Laceration without foreign body of unspecified hand, initial encounter: Secondary | ICD-10-CM

## 2012-01-07 NOTE — Assessment & Plan Note (Signed)
Continues to demonstrate improvement with no evidence for infection. Discussed wound healing by intention. No current indication for debridement and cautioned about potential debridement of healthy tissue. There is potential for scarring at upper wound border. Offered plastic surgery consult offered and declines.

## 2012-01-07 NOTE — Progress Notes (Signed)
  Subjective:    Patient ID: Tracy [name], male    DOB: Nov 01, 1925, 78 y.o.   MRN: [id number]  HPI Pt presents to clinic for re-evaluation of hand wound. Seen recently with staged removal of sutures s/p fall and laceration. Has had no evidence of infection. Apparently a nurse at their residence has been treating and debriding the wound on her own. She advised the patient to be re-evaluated. The lower necrotic skin flag fell off spontaneously with full closure underneath. The remaining upper wound has a thickened slightly raised leading skin edge. Total time of visit ~18 minutes of which >50% spent in counseling.  Past Medical History  Diagnosis Date  . Obstructive sleep apnea (adult) (pediatric)   . Chronic airway obstruction, not elsewhere classified   . Unspecified essential hypertension   . Cerebrovascular disease, unspecified   . Other and unspecified hyperlipidemia   . Diaphragmatic hernia without mention of obstruction or gangrene   . Diverticulosis of colon (without mention of hemorrhage)   . Benign neoplasm of colon   . Overweight    Past Surgical History  Procedure Date  . Hernia repair   . Nephroureterectomy     reports that he quit smoking about 33 years ago. His smoking use included Cigarettes. He smoked 1.5 packs per day. He has never used smokeless tobacco. He reports that he does not drink alcohol or use illicit drugs. family history is not on file. Allergies  Allergen Reactions  . Sulfamethoxazole     REACTION: "washes" him out per pt      Review of Systems see hpi     Objective:   Physical Exam  Nursing note and vitals reviewed. Constitutional: He appears well-developed and well-nourished. No distress.  HENT:  Head: Normocephalic and atraumatic.  Neurological: He is alert.  Skin: Skin is warm and dry. He is not diaphoretic.       Left hand- palmar aspect ulnar side. Upper leading skin edge thickened but essentially no movement with traction. No erythema,  warm or drainage.  Psychiatric: He has a normal mood and affect.          Assessment & Plan:

## 2012-01-10 ENCOUNTER — Ambulatory Visit: Payer: Medicare Other | Admitting: Pulmonary Disease

## 2012-01-20 ENCOUNTER — Other Ambulatory Visit: Payer: Self-pay | Admitting: *Deleted

## 2012-01-20 MED ORDER — FLUTICASONE-SALMETEROL 250-50 MCG/DOSE IN AEPB: 1.0000 | INHALATION_SPRAY | Freq: Two times a day (BID) | RESPIRATORY_TRACT | Status: DC

## 2012-02-08 ENCOUNTER — Ambulatory Visit (INDEPENDENT_AMBULATORY_CARE_PROVIDER_SITE_OTHER): Payer: TRICARE For Life (TFL) | Admitting: Pulmonary Disease

## 2012-02-08 ENCOUNTER — Encounter: Payer: Self-pay | Admitting: Pulmonary Disease

## 2012-02-08 VITALS — BP 140/90 | HR 78 | Temp 97.7°F | Ht 71.0 in | Wt 201.0 lb

## 2012-02-08 DIAGNOSIS — G4733 Obstructive sleep apnea (adult) (pediatric): Secondary | ICD-10-CM

## 2012-02-08 DIAGNOSIS — J449 Chronic obstructive pulmonary disease, unspecified: Secondary | ICD-10-CM

## 2012-02-08 DIAGNOSIS — J4489 Other specified chronic obstructive pulmonary disease: Secondary | ICD-10-CM

## 2012-02-08 NOTE — Assessment & Plan Note (Signed)
Stay on advair  & spiriva RINSE mouth after advair We discussed early signs of bronchitis 

## 2012-02-08 NOTE — Assessment & Plan Note (Signed)
Weight loss encouraged, compliance with goal of at least 4-6 hrs every night is the expectation. Advised against medications with sedative side effects Cautioned against driving when sleepy - understanding that sleepiness will vary on a day to day basis  

## 2012-02-08 NOTE — Progress Notes (Signed)
Subjective:    Patient ID: Tracy [name], male    DOB: May 20, 1926, 78 y.o.   MRN: [id number]  HPI  78 y/o WM here to establish FU of  OSA; COPD He is switching from dr nadel to our [place] office & has established with dr hodgin already as PCP  OSA> on CPAP nightly; doing well w/o snoring reported or daytime hypersomnolence...  COPD> on Home O2, Advair, Spiriva, Mucinex; he plays golf 2x per week; denies cough, sputum, ch in baseline dyspnea, etc...   11/29/2011 Acute OV >> improved with avelox + prednisone taper    Problem List:  OBSTRUCTIVE SLEEP APNEA (ICD-327.23) - on CPAP Qhs & using it regularly, doing well...  ~ sleep study 3/06 w/ RDI 26 & mod snoring w/ desat to 76%...  ~ Stable on CPAP nightly w/ improved snoring & no daytime symptoms...  COPD (ICD-496) - on home O2 (using it more regularly now), ADVAIR 250Bid, SPIRIVA 1/d, MUCINEX 1Bid...  .  ~ PFT 11/04 showed FVC=4.51 (103%), FEV1=1.93 (67%), %1sec=43, mid-flows=18%... LV's w/ some air trapping and DLCO~50%...  ~ CT Chest 9/05 showed marked emphysema w/ blebs, & thor spondylosis...  ~ Office spirometry 11/05 showed FEV1=1.57 (48%)...  ~ PFT 4/10 showed FVC= 3.00 (77%), FEV1= 1.37 (45%), %1sec= 46, mid-flows= 16% pred...  ~ 4/12: CXR showed COPD/ E, NAD; min cough & beige phlegm, no recent infections; chr stable DOE w/o change.  HYPERTENSION, BORDERLINE (ICD-401.9) - on MICARDIS/Hct 40-12.5 daily w/ good control of BP...  CEREBROVASCULAR DISEASE (ICD-437.9) - takes PLAVIX 75mg /d & refuses to take ASA 81mg  due to some minor bruising from thin skin- we discussed this and I rec (again) that he take the ASA daily..  ~ MRI Br 11/06 shows mod atrophy, extensive chr microvasc ischemia... 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  HYPERLIPIDEMIA (ICD-272.4) - on SIMVASTATIN 40mg /d + Fish Oil...  ~ FLP 4/09 on Vytor10-40 showed TChol 116, TG 184, HDL 36, LDL 44...rec- continue same.  ~ [facility] required change to Simvastatin 2010  & started on 40mg /d.    ~ FLP 4/12 showed TChol 141, TG 132, HDL 43, LDL 72... Continue Simva40, FishOil, diet...  OVERWEIGHT (ICD-278.02) - weight stable at ~200#... discussed diet + exercise program...  HIATAL HERNIA (ICD-553.3) - he denies reflux symptoms, trouble swallowing, etc...  DIVERTICULOSIS OF COLON (ICD-562.10) - he uses Metamucil daily & doing well.  COLONIC POLYPS (ICD-211.3) - last colonoscopy 7/07 by DrSam showed divertics, hems, polyps...  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 he continues regular GU follow up w/ DrOttelin yearly in the fall...  ~ Labs 4/12 showed BUN= 27, Creat= 1.3, K= 4.8, PSA= 1.40  DEGENERATIVE JOINT DISEASE (ICD-715.90) - he uses tylenol Prn...  ~ labs 4/10 showed Vit D level = 36... rec> 1000 u/d OTC  Health Maintenance:  ~ colonoscopy 7/07 by DrSamLeB showed divertics, hems, +polyps  ~ GU: followed by DrOttelin & seen each fall- neg cysto & he does his PSAs...  ~ Immunizations: had PNEUMOVAX in 66 @ age 18... OK TDAP 4/11... he gets yearly Flu vaccine each fall.    Past Surgical History   Procedure  Date   .  Hernia repair    .  Nephroureterectomy       Review of Systems  Constitutional: Negative for fever and  unexpected weight change.  HENT: Positive for sneezing. Negative for ear pain, nosebleeds, congestion, sore throat, rhinorrhea, trouble swallowing, dental problem, postnasal drip and sinus pressure.   Eyes: Negative for redness and itching.  Respiratory: Positive for shortness of breath. Negative for cough, chest tightness and wheezing.   Cardiovascular: Negative for palpitations and leg swelling.  Gastrointestinal: Negative for nausea and vomiting.  Genitourinary: Negative for dysuria.  Musculoskeletal: Negative for joint swelling.  Skin:  Negative for rash.  Neurological: Negative for headaches.  Hematological: Bruises/bleeds easily.  Psychiatric/Behavioral: Negative for dysphoric mood. The patient is not nervous/anxious.        Objective:   Physical Exam  Gen. Pleasant, well-nourished, in no distress ENT - no lesions, no post nasal drip Neck: No JVD, no thyromegaly, no carotid bruits Lungs: no use of accessory muscles, no dullness to percussion, decreased without rales or rhonchi  Cardiovascular: Rhythm regular, heart sounds  normal, no murmurs or gallops, no peripheral edema Musculoskeletal: No deformities, no cyanosis or clubbing        Assessment & Plan:

## 2012-02-08 NOTE — Patient Instructions (Signed)
Stay on advair  & spiriva RINSE mouth after advair We discussed early signs of bronchitis

## 2012-05-03 LAB — BASIC METABOLIC PANEL
BUN: 25 mg/dL — ABNORMAL HIGH (ref 6–23)
Potassium: 4.6 mEq/L (ref 3.5–5.3)
Sodium: 140 mEq/L (ref 135–145)

## 2012-05-03 LAB — CBC
MCHC: 34.9 g/dL (ref 30.0–36.0)
RDW: 13.8 % (ref 11.5–15.5)

## 2012-05-03 NOTE — Progress Notes (Signed)
Pt presented to the lab, orders released. 

## 2012-05-03 NOTE — Addendum Note (Signed)
Addended by: Mervin Kung A on: 05/03/2012 08:05 AM   Modules accepted: Orders

## 2012-05-04 LAB — LIPID PANEL
Cholesterol: 119 mg/dL (ref 0–200)
Total CHOL/HDL Ratio: 3.1 Ratio
Triglycerides: 154 mg/dL — ABNORMAL HIGH (ref <150)
VLDL: 31 mg/dL (ref 0–40)

## 2012-05-04 LAB — HEPATIC FUNCTION PANEL
ALT: 18 U/L (ref 0–53)
AST: 24 U/L (ref 0–37)
Indirect Bilirubin: 0.5 mg/dL (ref 0.0–0.9)
Total Protein: 6.6 g/dL (ref 6.0–8.3)

## 2012-05-08 ENCOUNTER — Encounter: Payer: Self-pay | Admitting: Internal Medicine

## 2012-05-08 ENCOUNTER — Ambulatory Visit (INDEPENDENT_AMBULATORY_CARE_PROVIDER_SITE_OTHER): Payer: 59 | Admitting: Internal Medicine

## 2012-05-08 VITALS — BP 98/62 | HR 69 | Wt 203.0 lb

## 2012-05-08 DIAGNOSIS — D45 Polycythemia vera: Secondary | ICD-10-CM

## 2012-05-08 DIAGNOSIS — R7989 Other specified abnormal findings of blood chemistry: Secondary | ICD-10-CM | POA: Insufficient documentation

## 2012-05-08 DIAGNOSIS — S61409A Unspecified open wound of unspecified hand, initial encounter: Secondary | ICD-10-CM

## 2012-05-08 DIAGNOSIS — R799 Abnormal finding of blood chemistry, unspecified: Secondary | ICD-10-CM

## 2012-05-08 DIAGNOSIS — D751 Secondary polycythemia: Secondary | ICD-10-CM | POA: Insufficient documentation

## 2012-05-08 DIAGNOSIS — S61419A Laceration without foreign body of unspecified hand, initial encounter: Secondary | ICD-10-CM

## 2012-05-08 NOTE — Patient Instructions (Signed)
Please schedule non fasting labs in 6 weeks Cbc-polycythemia and chem7- elevated creatinine Please schedule fasting labs prior to next visit chem7-v58.69, cbc-polycythemia and lipid/lft-272.4

## 2012-05-08 NOTE — Assessment & Plan Note (Signed)
Repeat chem7 in 4-6 weeks. If remains elevated then dc hctz

## 2012-05-08 NOTE — Assessment & Plan Note (Signed)
resolved 

## 2012-05-08 NOTE — Progress Notes (Signed)
  Subjective:    Patient ID: Tracy [name], male    DOB: 04-Dec-1925, 78 y.o.   MRN: [id number]  HPI Pt presents to clinic for followup of multiple medical problems. Reports left hand laceration did heal. Notes intermittent mild discomfort in location. H/o COPD and reviewed polycythemia with hgb of 20.4. No known recent neurologic events. Creatinine reviewed minimally elevated.   Past Medical History  Diagnosis Date  . Obstructive sleep apnea (adult) (pediatric)   . COPD, severe   . Uncontrolled hypertension as indication for native nephrectomy   . Cerebrovascular disease, unspecified   . Other and unspecified hyperlipidemia   . Diaphragmatic hernia without mention of obstruction or gangrene   . Diverticulosis of colon (without mention of hemorrhage)   . Benign neoplasm of colon   . Overweight    Past Surgical History  Procedure Date  . Hernia repair   . Nephroureterectomy     reports that he quit smoking about 33 years ago. His smoking use included Cigarettes. He smoked 1.5 packs per day. He has never used smokeless tobacco. He reports that he does not drink alcohol or use illicit drugs. family history is not on file. Allergies  Allergen Reactions  . Sulfamethoxazole     REACTION: "washes" him out per pt      Review of Systems see hpi     Objective:   Physical Exam  Physical Exam  Nursing note and vitals reviewed. Constitutional: Appears well-developed and well-nourished. No distress.  HENT:  Head: Normocephalic and atraumatic.  Right Ear: External ear normal.  Left Ear: External ear normal.  Eyes: Conjunctivae are normal. No scleral icterus.  Neck: Neck supple. Carotid bruit is not present.  Cardiovascular: Normal rate, regular rhythm and normal heart sounds.  Exam reveals no gallop and no friction rub.   No murmur heard. Pulmonary/Chest: Effort normal and breath sounds normal. No respiratory distress. He has no wheezes. no rales.  Lymphadenopathy:    He has no  cervical adenopathy.  Neurological:Alert.  Skin: Skin is warm and dry. Not diaphoretic.  Psychiatric: Has a normal mood and affect.        Assessment & Plan:

## 2012-05-08 NOTE — Assessment & Plan Note (Signed)
?  secondary to underlying copd. Repeat cbc at 4-6 weeks. Neurologically asx.

## 2012-06-14 ENCOUNTER — Other Ambulatory Visit: Payer: Self-pay | Admitting: *Deleted

## 2012-06-14 DIAGNOSIS — D751 Secondary polycythemia: Secondary | ICD-10-CM

## 2012-06-14 DIAGNOSIS — R7989 Other specified abnormal findings of blood chemistry: Secondary | ICD-10-CM

## 2012-06-14 DIAGNOSIS — E785 Hyperlipidemia, unspecified: Secondary | ICD-10-CM

## 2012-06-14 DIAGNOSIS — Z79899 Other long term (current) drug therapy: Secondary | ICD-10-CM

## 2012-06-14 LAB — CBC WITH DIFFERENTIAL/PLATELET
Basophils Absolute: 0 10*3/uL (ref 0.0–0.1)
Basophils Relative: 0 % (ref 0–1)
Eosinophils Absolute: 0.3 10*3/uL (ref 0.0–0.7)
Eosinophils Relative: 4 % (ref 0–5)
Lymphocytes Relative: 20 % (ref 12–46)
MCHC: 34.4 g/dL (ref 30.0–36.0)
MCV: 91.2 fL (ref 78.0–100.0)
Platelets: 182 10*3/uL (ref 150–400)
RDW: 14.3 % (ref 11.5–15.5)
WBC: 6.7 10*3/uL (ref 4.0–10.5)

## 2012-06-14 LAB — BASIC METABOLIC PANEL
BUN: 30 mg/dL — ABNORMAL HIGH (ref 6–23)
Calcium: 9.1 mg/dL (ref 8.4–10.5)
Chloride: 106 mEq/L (ref 96–112)
Creat: 1.41 mg/dL — ABNORMAL HIGH (ref 0.50–1.35)

## 2012-06-14 NOTE — Progress Notes (Signed)
Future lab orders entered/SLS

## 2012-06-21 ENCOUNTER — Telehealth: Payer: Self-pay | Admitting: Internal Medicine

## 2012-06-21 NOTE — Telephone Encounter (Signed)
Patient is requesting last lab results 

## 2012-06-23 MED ORDER — CLOPIDOGREL BISULFATE 75 MG PO TABS: 75.0000 mg | ORAL_TABLET | Freq: Every day | ORAL | Status: DC

## 2012-06-23 MED ORDER — SIMVASTATIN 40 MG PO TABS: 40.0000 mg | ORAL_TABLET | Freq: Every day | ORAL | Status: DC

## 2012-06-23 MED ORDER — FLUTICASONE-SALMETEROL 250-50 MCG/DOSE IN AEPB: 1.0000 | INHALATION_SPRAY | Freq: Two times a day (BID) | RESPIRATORY_TRACT | Status: DC

## 2012-06-23 MED ORDER — TELMISARTAN 40 MG PO TABS: 40.0000 mg | ORAL_TABLET | Freq: Every day | ORAL | Status: DC

## 2012-06-23 MED ORDER — TIOTROPIUM BROMIDE MONOHYDRATE 18 MCG IN CAPS: 18.0000 ug | ORAL_CAPSULE | Freq: Every day | RESPIRATORY_TRACT | Status: DC

## 2012-06-23 NOTE — Telephone Encounter (Signed)
Have been reviewing old lab results. His hgb remains elevated but is not increasing. Believe this is elevated due to his lung disease. Feel that it can be watched with lab work. Also the kidney test remains mildly elevated. Would recommend changing his micardis hct to micardis.-just drop the hctz component and keep the micardis dose the same

## 2012-06-23 NOTE — Telephone Encounter (Signed)
Patient informed, understood & agreed/SLS Request year supply printed RX to be picked up at office/SLS

## 2012-08-31 ENCOUNTER — Telehealth: Payer: Self-pay | Admitting: Pulmonary Disease

## 2012-08-31 NOTE — Telephone Encounter (Signed)
Error.Tracy [name] ° °

## 2012-09-12 ENCOUNTER — Encounter: Payer: Self-pay | Admitting: Pulmonary Disease

## 2012-09-12 ENCOUNTER — Ambulatory Visit (INDEPENDENT_AMBULATORY_CARE_PROVIDER_SITE_OTHER): Payer: 59 | Admitting: Pulmonary Disease

## 2012-09-12 VITALS — BP 110/62 | HR 98 | Temp 98.1°F | Ht 70.0 in | Wt 200.0 lb

## 2012-09-12 DIAGNOSIS — J9601 Acute respiratory failure with hypoxia: Secondary | ICD-10-CM

## 2012-09-12 DIAGNOSIS — J9600 Acute respiratory failure, unspecified whether with hypoxia or hypercapnia: Secondary | ICD-10-CM

## 2012-09-12 DIAGNOSIS — J449 Chronic obstructive pulmonary disease, unspecified: Secondary | ICD-10-CM

## 2012-09-12 NOTE — Assessment & Plan Note (Addendum)
Check oxygen levels during sleep You will benefit from a pulmonary rehab program - find out at [facility] If nothing else, we will get you nebuliser with albuterol nebs Ct advair /spiriva I wonder if he has superimposed ILD based on his CT abdomen  but the images are not clear

## 2012-09-12 NOTE — Progress Notes (Signed)
  Subjective:    Patient ID: Tracy [name], male    DOB: 1926/02/26, 78 y.o.   MRN: [id number]  HPI PCP- Hodgin 78 y/o WM for  FU of OSA; COPD    OBSTRUCTIVE SLEEP APNEA  - ~ sleep study 76 w/ RDI 26  w/ desat to 76%...   COPD  - on home O2 .  ~ PFT 11/04 showed FVC=4.51 (103%), FEV1=1.93 (67%) and DLCO~50%...  ~ CT Chest 9/05  & CT abd 1/12 showed marked emphysema w/ blebs, & thor spondylosis...  ~ Office spirometry 11/05 showed FEV1=1.57 (48%)...  ~ PFT 4/10 showed FVC= 3.00 (77%), FEV1= 1.37 (45%)    11/29/2011 Acute OV >> improved with avelox + prednisone taper    09/12/2012 Desires reevaluation Reports Class  3 dyspnea desatn (on his own oximeter) with minmal exertion inspite of oxygen use. No sputum production, recent URI symptoms, or pedal edema Satn ok on 3l pulse at rest but requires 6L continuous on walking  Past Medical History  Diagnosis Date  . Obstructive sleep apnea (adult) (pediatric)   . Chronic airway obstruction, not elsewhere classified   . Unspecified essential hypertension   . Cerebrovascular disease, unspecified   . Other and unspecified hyperlipidemia   . Diaphragmatic hernia without mention of obstruction or gangrene   . Diverticulosis of colon (without mention of hemorrhage)   . Benign neoplasm of colon   . Overweight      Review of Systems neg for any significant sore throat, dysphagia, itching, sneezing, nasal congestion or excess/ purulent secretions, fever, chills, sweats, unintended wt loss, pleuritic or exertional cp, hempoptysis, orthopnea pnd or change in chronic leg swelling. Also denies presyncope, palpitations, heartburn, abdominal pain, nausea, vomiting, diarrhea or change in bowel or urinary habits, dysuria,hematuria, rash, arthralgias, visual complaints, headache, numbness weakness or ataxia.     Objective:   Physical Exam  Gen. Pleasant, well-nourished, in no distress, normal affect ENT - no lesions, no post nasal drip Neck: No  JVD, no thyromegaly, no carotid bruits Lungs: no use of accessory muscles, no dullness to percussion, decreased without rales or rhonchi  Cardiovascular: Rhythm regular, heart sounds  normal, no murmurs or gallops, no peripheral edema Abdomen: soft and non-tender, no hepatosplenomegaly, BS normal. Musculoskeletal: No deformities, no cyanosis or clubbing Neuro:  alert, non focal        Assessment & Plan:

## 2012-09-12 NOTE — Patient Instructions (Addendum)
You will need continuous O2 while walking Check oxygen levels during sleep CT scan Breathing test will be scheduled You will benefit from a pulmonary rehab program - find out at [facility] If nothing else, we will get you nebuliser with albuterol nebs

## 2012-09-13 DIAGNOSIS — J9601 Acute respiratory failure with hypoxia: Secondary | ICD-10-CM | POA: Insufficient documentation

## 2012-09-13 NOTE — Assessment & Plan Note (Addendum)
Worsening hypoxia requiring 6L O2  Possible causes include worsening COPD, no evidence of heart failure, PE  Wonder if he has superimposed ILD. Will proceed with CT angio

## 2012-09-13 NOTE — Addendum Note (Signed)
Addended by: Tommie Sams on: 09/13/2012 04:47 PM   Modules accepted: Orders

## 2012-09-14 ENCOUNTER — Other Ambulatory Visit: Payer: Self-pay | Admitting: Pulmonary Disease

## 2012-09-14 ENCOUNTER — Ambulatory Visit (HOSPITAL_BASED_OUTPATIENT_CLINIC_OR_DEPARTMENT_OTHER): Payer: 59

## 2012-09-15 ENCOUNTER — Other Ambulatory Visit (HOSPITAL_BASED_OUTPATIENT_CLINIC_OR_DEPARTMENT_OTHER): Payer: 59

## 2012-09-15 ENCOUNTER — Ambulatory Visit (INDEPENDENT_AMBULATORY_CARE_PROVIDER_SITE_OTHER): Payer: 59 | Admitting: Pulmonary Disease

## 2012-09-15 DIAGNOSIS — J449 Chronic obstructive pulmonary disease, unspecified: Secondary | ICD-10-CM

## 2012-09-15 LAB — BASIC METABOLIC PANEL
Chloride: 105 mEq/L (ref 96–112)
Potassium: 4.3 mEq/L (ref 3.5–5.3)
Sodium: 139 mEq/L (ref 135–145)

## 2012-09-15 LAB — PULMONARY FUNCTION TEST

## 2012-09-15 NOTE — Progress Notes (Signed)
PFT done today. 

## 2012-09-18 ENCOUNTER — Ambulatory Visit (HOSPITAL_BASED_OUTPATIENT_CLINIC_OR_DEPARTMENT_OTHER)
Admission: RE | Admit: 2012-09-18 | Discharge: 2012-09-18 | Disposition: A | Discharge status: Home / Self Care | Payer: Medicare Other | Source: Ambulatory Visit | Attending: Pulmonary Disease | Admitting: Pulmonary Disease

## 2012-09-18 ENCOUNTER — Other Ambulatory Visit (HOSPITAL_BASED_OUTPATIENT_CLINIC_OR_DEPARTMENT_OTHER): Payer: 59

## 2012-09-18 ENCOUNTER — Encounter (HOSPITAL_BASED_OUTPATIENT_CLINIC_OR_DEPARTMENT_OTHER): Payer: Self-pay

## 2012-09-18 ENCOUNTER — Encounter (HOSPITAL_COMMUNITY): Payer: Self-pay | Admitting: *Deleted

## 2012-09-18 ENCOUNTER — Inpatient Hospital Stay (HOSPITAL_BASED_OUTPATIENT_CLINIC_OR_DEPARTMENT_OTHER)
Admission: AD | Admit: 2012-09-18 | Discharge: 2012-09-19 | DRG: 175 | Disposition: A | Discharge status: Nursing Home (Includes ICF) | Payer: Medicare Other | Source: Ambulatory Visit | Attending: Pulmonary Disease | Admitting: Pulmonary Disease

## 2012-09-18 ENCOUNTER — Telehealth: Payer: Self-pay | Admitting: Pulmonary Disease

## 2012-09-18 DIAGNOSIS — Z87891 Personal history of nicotine dependence: Secondary | ICD-10-CM

## 2012-09-18 DIAGNOSIS — S61419A Laceration without foreign body of unspecified hand, initial encounter: Secondary | ICD-10-CM

## 2012-09-18 DIAGNOSIS — C679 Malignant neoplasm of bladder, unspecified: Secondary | ICD-10-CM

## 2012-09-18 DIAGNOSIS — J9601 Acute respiratory failure with hypoxia: Secondary | ICD-10-CM

## 2012-09-18 DIAGNOSIS — J449 Chronic obstructive pulmonary disease, unspecified: Secondary | ICD-10-CM | POA: Diagnosis present

## 2012-09-18 DIAGNOSIS — D126 Benign neoplasm of colon, unspecified: Secondary | ICD-10-CM

## 2012-09-18 DIAGNOSIS — E663 Overweight: Secondary | ICD-10-CM

## 2012-09-18 DIAGNOSIS — K5730 Diverticulosis of large intestine without perforation or abscess without bleeding: Secondary | ICD-10-CM

## 2012-09-18 DIAGNOSIS — I10 Essential (primary) hypertension: Secondary | ICD-10-CM

## 2012-09-18 DIAGNOSIS — J4489 Other specified chronic obstructive pulmonary disease: Secondary | ICD-10-CM

## 2012-09-18 DIAGNOSIS — K449 Diaphragmatic hernia without obstruction or gangrene: Secondary | ICD-10-CM

## 2012-09-18 DIAGNOSIS — Z882 Allergy status to sulfonamides status: Secondary | ICD-10-CM

## 2012-09-18 DIAGNOSIS — I2699 Other pulmonary embolism without acute cor pulmonale: Principal | ICD-10-CM

## 2012-09-18 DIAGNOSIS — I679 Cerebrovascular disease, unspecified: Secondary | ICD-10-CM

## 2012-09-18 DIAGNOSIS — Z905 Acquired absence of kidney: Secondary | ICD-10-CM

## 2012-09-18 DIAGNOSIS — Z860100 Personal history of colon polyps, unspecified: Secondary | ICD-10-CM

## 2012-09-18 DIAGNOSIS — R7989 Other specified abnormal findings of blood chemistry: Secondary | ICD-10-CM

## 2012-09-18 DIAGNOSIS — Z6829 Body mass index (BMI) 29.0-29.9, adult: Secondary | ICD-10-CM

## 2012-09-18 DIAGNOSIS — D751 Secondary polycythemia: Secondary | ICD-10-CM

## 2012-09-18 DIAGNOSIS — Z79899 Other long term (current) drug therapy: Secondary | ICD-10-CM

## 2012-09-18 DIAGNOSIS — G4733 Obstructive sleep apnea (adult) (pediatric): Secondary | ICD-10-CM

## 2012-09-18 DIAGNOSIS — M1990 Unspecified osteoarthritis, unspecified site: Secondary | ICD-10-CM

## 2012-09-18 DIAGNOSIS — Z7902 Long term (current) use of antithrombotics/antiplatelets: Secondary | ICD-10-CM

## 2012-09-18 DIAGNOSIS — Z8719 Personal history of other diseases of the digestive system: Secondary | ICD-10-CM

## 2012-09-18 DIAGNOSIS — R531 Weakness: Secondary | ICD-10-CM

## 2012-09-18 DIAGNOSIS — J9600 Acute respiratory failure, unspecified whether with hypoxia or hypercapnia: Secondary | ICD-10-CM | POA: Diagnosis present

## 2012-09-18 DIAGNOSIS — R911 Solitary pulmonary nodule: Secondary | ICD-10-CM | POA: Diagnosis present

## 2012-09-18 DIAGNOSIS — E785 Hyperlipidemia, unspecified: Secondary | ICD-10-CM

## 2012-09-18 HISTORY — DX: Dependence on other enabling machines and devices: Z99.89

## 2012-09-18 HISTORY — DX: Obstructive sleep apnea (adult) (pediatric): G47.33

## 2012-09-18 HISTORY — DX: Dependence on supplemental oxygen: Z99.81

## 2012-09-18 LAB — PROTIME-INR
INR: 1.1 (ref 0.00–1.49)
Prothrombin Time: 14.1 seconds (ref 11.6–15.2)

## 2012-09-18 LAB — PRO B NATRIURETIC PEPTIDE: Pro B Natriuretic peptide (BNP): 206.8 pg/mL (ref 0–450)

## 2012-09-18 LAB — MRSA PCR SCREENING: MRSA by PCR: NEGATIVE

## 2012-09-18 MED ORDER — IRBESARTAN 150 MG PO TABS
150.0000 mg | ORAL_TABLET | Freq: Every day | ORAL | Status: DC
  Administered 2012-09-18 – 2012-09-19 (×2): 150 mg via ORAL
  Filled 2012-09-18 (×2): qty 1

## 2012-09-18 MED ORDER — COUMADIN BOOK
Freq: Once | Status: AC
  Administered 2012-09-18: 18:00:00
  Filled 2012-09-18: qty 1

## 2012-09-18 MED ORDER — OMEGA-3-ACID ETHYL ESTERS 1 G PO CAPS
2.0000 g | ORAL_CAPSULE | Freq: Two times a day (BID) | ORAL | Status: DC
  Administered 2012-09-18: 2 g via ORAL
  Filled 2012-09-18 (×3): qty 2

## 2012-09-18 MED ORDER — ENOXAPARIN SODIUM 100 MG/ML ~~LOC~~ SOLN
90.0000 mg | Freq: Two times a day (BID) | SUBCUTANEOUS | Status: DC
  Administered 2012-09-18 – 2012-09-19 (×2): 90 mg via SUBCUTANEOUS
  Filled 2012-09-18 (×4): qty 1

## 2012-09-18 MED ORDER — SIMVASTATIN 40 MG PO TABS
40.0000 mg | ORAL_TABLET | Freq: Every day | ORAL | Status: DC
  Administered 2012-09-18: 40 mg via ORAL
  Filled 2012-09-18 (×2): qty 1

## 2012-09-18 MED ORDER — OMEGA-3 FATTY ACIDS 1000 MG PO CAPS: 2.0000 g | ORAL_CAPSULE | Freq: Two times a day (BID) | ORAL | Status: DC

## 2012-09-18 MED ORDER — WARFARIN VIDEO: Freq: Once | Status: DC

## 2012-09-18 MED ORDER — TIOTROPIUM BROMIDE MONOHYDRATE 18 MCG IN CAPS
18.0000 ug | ORAL_CAPSULE | Freq: Every day | RESPIRATORY_TRACT | Status: DC
  Administered 2012-09-19: 18 ug via RESPIRATORY_TRACT
  Filled 2012-09-18: qty 5

## 2012-09-18 MED ORDER — WARFARIN SODIUM 5 MG PO TABS
5.0000 mg | ORAL_TABLET | Freq: Once | ORAL | Status: AC
  Administered 2012-09-18: 5 mg via ORAL
  Filled 2012-09-18: qty 1

## 2012-09-18 MED ORDER — ALBUTEROL SULFATE (5 MG/ML) 0.5% IN NEBU: 2.5000 mg | INHALATION_SOLUTION | RESPIRATORY_TRACT | Status: DC | PRN

## 2012-09-18 MED ORDER — MOMETASONE FURO-FORMOTEROL FUM 100-5 MCG/ACT IN AERO
2.0000 | INHALATION_SPRAY | Freq: Two times a day (BID) | RESPIRATORY_TRACT | Status: DC
  Administered 2012-09-18 – 2012-09-19 (×2): 2 via RESPIRATORY_TRACT
  Filled 2012-09-18: qty 8.8

## 2012-09-18 MED ORDER — GUAIFENESIN ER 600 MG PO TB12
1200.0000 mg | ORAL_TABLET | Freq: Two times a day (BID) | ORAL | Status: DC
  Administered 2012-09-18 – 2012-09-19 (×2): 1200 mg via ORAL
  Filled 2012-09-18 (×3): qty 2

## 2012-09-18 MED ORDER — HYDROCHLOROTHIAZIDE 25 MG PO TABS
25.0000 mg | ORAL_TABLET | Freq: Every day | ORAL | Status: DC
  Administered 2012-09-18 – 2012-09-19 (×2): 25 mg via ORAL
  Filled 2012-09-18 (×2): qty 1

## 2012-09-18 MED ORDER — WARFARIN - PHARMACIST DOSING INPATIENT: Freq: Every day | Status: DC

## 2012-09-18 MED ORDER — IOHEXOL 350 MG/ML SOLN
100.0000 mL | Freq: Once | INTRAVENOUS | Status: AC | PRN
  Administered 2012-09-18: 80 mL via INTRAVENOUS

## 2012-09-18 NOTE — Progress Notes (Signed)
Placed pt on CPAP per MD order via nasal mask,home settings of 12.0 cm H20 with 4 lpm O2 bleed in. Pt. Tolerating well at this time.

## 2012-09-18 NOTE — Progress Notes (Addendum)
ANTICOAGULATION CONSULT NOTE - Initial Consult  Pharmacy Consult for lovenox Indication: pulmonary embolus  Allergies  Allergen Reactions  . Sulfamethoxazole     REACTION: "washes" him out per pt    Patient Measurements: Height: 5\' 9"  (175.3 cm) Weight: 199 lb (90.266 kg) IBW/kg (Calculated) : 70.7   Vital Signs: Temp: 98.2 F (36.8 C) (12/23 1412) Temp src: Oral (12/23 1412) BP: 156/104 mmHg (12/23 1412) Pulse Rate: 118  (12/23 1412)  Labs: No results found for this basename: HGB:2,HCT:3,PLT:3,APTT:3,LABPROT:3,INR:3,HEPARINUNFRC:3,CREATININE:3,CKTOTAL:3,CKMB:3,TROPONINI:3 in the last 72 hours  Estimated Creatinine Clearance: 43.9 ml/min (by C-G formula based on Cr of 1.39).   Medical History: Past Medical History  Diagnosis Date  . Obstructive sleep apnea (adult) (pediatric)   . Chronic airway obstruction, not elsewhere classified   . Unspecified essential hypertension   . Cerebrovascular disease, unspecified   . Other and unspecified hyperlipidemia   . Diaphragmatic hernia without mention of obstruction or gangrene   . Diverticulosis of colon (without mention of hemorrhage)   . Benign neoplasm of colon   . Overweight     Medications:  Prescriptions prior to admission  Medication Sig Dispense Refill  . acetaminophen (TYLENOL ARTHRITIS PAIN) 650 MG CR tablet Take 650 mg by mouth daily.       . Cholecalciferol (VITAMIN D3) 1000 UNITS CAPS Take by mouth.        . clopidogrel (PLAVIX) 75 MG tablet Take 1 tablet (75 mg total) by mouth daily.  30 tablet  11  . fish oil-omega-3 fatty acids 1000 MG capsule Take 2 g by mouth 2 (two) times daily.        . Fluticasone-Salmeterol (ADVAIR DISKUS) 250-50 MCG/DOSE AEPB Inhale 1 puff into the lungs every 12 (twelve) hours.  60 each  11  . guaiFENesin (MUCINEX) 600 MG 12 hr tablet Take 1,200 mg by mouth 2 (two) times daily.       . Multiple Vitamins-Minerals (CENTRUM SILVER PO) Take 1 capsule by mouth daily.        . psyllium  (METAMUCIL) 58.6 % powder Take 1 packet by mouth daily.        . simvastatin (ZOCOR) 40 MG tablet Take 1 tablet (40 mg total) by mouth at bedtime.  30 tablet  11  . telmisartan-hydrochlorothiazide (MICARDIS HCT) 40-12.5 MG per tablet Take 1 tablet by mouth daily.  30 tablet  11  . tiotropium (SPIRIVA) 18 MCG inhalation capsule Place 1 capsule (18 mcg total) into inhaler and inhale daily.  30 capsule  11    Assessment: 78 yo man admitted with PE to start lovenox. Goal of Therapy:  Anti-Xa level 0.6-1.2 units/ml 4hrs after LMWH dose given Monitor platelets by anticoagulation protocol: Yes   Plan:  Lovenox 90 mg sq q12 hours CBC q 3 days. F/u start oral AC.  Talbert Cage Poteet 09/18/2012,2:21 PM  Addendum: Now adding warfarin for PE. Baseline INR is WNL. Due to patient age will only start with 5mg .  Plan: 1. Coumadin 5mg  PO x  1 tonight 2. Daily INR 3. Coumadin education materials   Lysle Pearl, PharmD, BCPS Pager # [phone] 09/18/2012 5:05 PM

## 2012-09-18 NOTE — Telephone Encounter (Signed)
Result Note     Pl check on him.   Tell him he will need blood thinner shots & he is better getting admitted to the hosiptal.   Please get him a bed & get him in [facility] direct admit [facility]   ------ I spoke with pt. He states he feels fine. He is having no symptoms. Pt will be in unit 2000. Pt is aware he is being admitted to [facility]. He is aware were to go. He is understanding of this. He is on his way now. Dr. Vassie Loll is aware of this. Nothing further was needed

## 2012-09-18 NOTE — H&P (Signed)
PULMONARY  / CRITICAL CARE MEDICINE  Name: Tracy [name] MRN: [id number] DOB: Jun 06, 1926    LOS: 0  PCP- Hodgin  CHIEF COMPLAINT:  dyspnea  HISTORY OF PRESENT ILLNESS:   78 y/o WM  with OSA; COPD seen in the office on 09/12/2012  Reporting Class 3 dyspnea  desatn (on his own oximeter) with minmal exertion inspite of oxygen use.  No sputum production, recent URI symptoms, or pedal edema  Satn ok on 3l pulse at rest but required 6L continuous on walking  Ct angio was ordered which showed Subsegmental acute pulmonary thromboembolism in the right lower lobe, 7 mm spiculated lung lesion in the right middle lobe  & bullous emphysema    OBSTRUCTIVE SLEEP APNEA - ~ sleep study 3/06 w/ RDI 26 w/ desat to 76%...  COPD - on home O2 .  ~ PFT 11/04 showed FVC=4.51 (103%), FEV1=1.93 (67%) and DLCO~50%...  ~ CT Chest 9/05 & CT abd 1/12 showed marked emphysema w/ blebs, & thor spondylosis...  ~ Office spirometry 11/05 showed FEV1=1.57 (48%)...  ~ PFT 4/10 showed FVC= 3.00 (77%), FEV1= 1.37 (45%)  11/29/2011 Acute OV >> improved with avelox + prednisone taper     PAST MEDICAL HISTORY :  Past Medical History  Diagnosis Date  . Obstructive sleep apnea (adult) (pediatric)   . Chronic airway obstruction, not elsewhere classified   . Unspecified essential hypertension   . Cerebrovascular disease, unspecified   . Other and unspecified hyperlipidemia   . Diaphragmatic hernia without mention of obstruction or gangrene   . Diverticulosis of colon (without mention of hemorrhage)   . Benign neoplasm of colon   . Overweight    Past Surgical History  Procedure Date  . Hernia repair   . Nephroureterectomy    Prior to Admission medications   Medication Sig Start Date End Date Taking? Authorizing Provider  acetaminophen (TYLENOL ARTHRITIS PAIN) 650 MG CR tablet Take 650 mg by mouth every other day.    Yes Historical Provider, MD  clopidogrel (PLAVIX) 75 MG tablet Take 1 tablet (75 mg total) by mouth  daily. 06/23/12 06/23/13 Yes Edwyna Perfect, MD  fish oil-omega-3 fatty acids 1000 MG capsule Take 2 g by mouth 2 (two) times daily.     Yes Historical Provider, MD  Fluticasone-Salmeterol (ADVAIR DISKUS) 250-50 MCG/DOSE AEPB Inhale 1 puff into the lungs every 12 (twelve) hours. 06/23/12 06/23/13 Yes Edwyna Perfect, MD  guaiFENesin (MUCINEX) 600 MG 12 hr tablet Take 1,200 mg by mouth 2 (two) times daily.    Yes Historical Provider, MD  Multiple Vitamins-Minerals (CENTRUM SILVER PO) Take 1 capsule by mouth daily.     Yes Historical Provider, MD  simvastatin (ZOCOR) 40 MG tablet Take 1 tablet (40 mg total) by mouth at bedtime. 06/23/12  Yes Edwyna Perfect, MD  telmisartan-hydrochlorothiazide (MICARDIS HCT) 40-12.5 MG per tablet Take 1 tablet by mouth at bedtime. 07/12/11  Yes Michele Mcalpine, MD  tiotropium (SPIRIVA) 18 MCG inhalation capsule Place 1 capsule (18 mcg total) into inhaler and inhale daily. 06/23/12  Yes Edwyna Perfect, MD   Allergies  Allergen Reactions  . Sulfamethoxazole     REACTION: "washes" him out per pt    FAMILY HISTORY:  No family history on file. SOCIAL HISTORY:  reports that he quit smoking about 34 years ago. His smoking use included Cigarettes. He smoked 1.5 packs per day. He has never used smokeless tobacco. He reports that he does not drink alcohol or use illicit  drugs.  REVIEW OF SYSTEMS:   Constitutional: Negative for fever, chills, weight loss, malaise/fatigue and diaphoresis.  HENT: Negative for hearing loss, ear pain, nosebleeds, congestion, sore throat, neck pain, tinnitus and ear discharge.   Eyes: Negative for blurred vision, double vision, photophobia, pain, discharge and redness.  Respiratory: Negative for cough, hemoptysis, sputum production, wheezing and stridor.  POS for shortness of breath, Cardiovascular: Negative for chest pain, palpitations, orthopnea, claudication, leg swelling and PND.  Gastrointestinal: Negative for heartburn, nausea, vomiting,  abdominal pain, diarrhea, constipation, blood in stool and melena.  Genitourinary: Negative for dysuria, urgency, frequency, hematuria and flank pain.  Musculoskeletal: Negative for myalgias, back pain, joint pain and falls.  Skin: Negative for itching and rash.  Neurological: Negative for dizziness, tingling, tremors, sensory change, speech change, focal weakness, seizures, loss of consciousness, weakness and headaches.  Endo/Heme/Allergies: Negative for environmental allergies and polydipsia. Does not bruise/bleed easily.  INTERVAL HISTORY:   VITAL SIGNS: Temp:  [98.2 F (36.8 C)] 98.2 F (36.8 C) (12/23 1412) Pulse Rate:  [118] 118  (12/23 1412) Resp:  [18] 18  (12/23 1412) BP: (106-156)/(100-104) 150/100 mmHg (12/23 1455) SpO2:  [92 %] 92 % (12/23 1412) Weight:  [90.266 kg (199 lb)] 90.266 kg (199 lb) (12/23 1412)  PHYSICAL EXAMINATION: Gen. Pleasant, well-nourished, in no distress, normal affect ENT - no lesions, no post nasal drip Neck: No JVD, no thyromegaly, no carotid bruits Lungs: no use of accessory muscles, no dullness to percussion, decreased  without rales or rhonchi  Cardiovascular: Rhythm regular, heart sounds  normal, no murmurs, no peripheral edema Abdomen: soft and non-tender, no hepatosplenomegaly, BS normal. Musculoskeletal: No deformities, no cyanosis or clubbing Neuro:  alert, non focal Skin:  Warm, no lesions/ rash    Lab 09/14/12 1130  NA 139  K 4.3  CL 105  CO2 26  BUN 24*  CREATININE 1.39*  GLUCOSE 84   No results found for this basename: HGB:3,HCT:3,WBC:3,PLT:3 in the last 168 hours Ct Angio Chest Pe W/cm &/or Wo Cm  09/18/2012  *RADIOLOGY REPORT*  Clinical Data: Hypoxia  CT ANGIOGRAPHY CHEST  Technique:  Multidetector CT imaging of the chest using the standard protocol during bolus administration of intravenous contrast. Multiplanar reconstructed images including MIPs were obtained and reviewed to evaluate the vascular anatomy.  Contrast: 80mL  OMNIPAQUE IOHEXOL 350 MG/ML SOLN  Comparison: 05/28/2004  Findings: There is a subtle low density filling defect in a subsegmental branch of the anterior basal segment of the right lower lobe.  See images 206 through 208 of series 5.  Three-vessel coronary artery calcification as well as the left main coronary artery.  Mild aortic valve calcifications.  Negative abnormal mediastinal adenopathy.  No pericardial effusion.  No pneumothorax.  No pleural effusion.  Severe centrilobular emphysema.  Spiculated 7 mm nodule in the right middle lobe on image 56.  No destructive bone lesion.  Low density in the lateral segment of the left lobe is partially imaged on image 89 worrisome for a liver lesion.  IMPRESSION: Subsegmental acute pulmonary thromboembolism in the right lower lobe.  7 mm spiculated lung lesion in the right middle lobe worrisome for bronchogenic carcinoma.  Liver lesion is suspected and partially imaged. Liver MRI is recommended to further characterize.  Severe chronic changes of the lung parenchyma. Critical Value/emergent results were called by telephone at the time of interpretation on 09/18/2012 at 1117 hours to [facility], who verbally acknowledged these results.   Original Report Authenticated By: Jolaine Click, M.D.  ASSESSMENT / PLAN:  Acute respiratory failure with worsening hypoxia  COPD -Titrate O2 will need continuous on exertion 4-6L -advair/spiriva/ nebs prn  Pulmonary embolism - Lovenox Rx doses Discussed pros & cons of anticoagulant classes - he prefers coumadin Will ask pharmacy to dose Will plan for 3-6 mnths depending on course STOP plavix Doppler LEs for completion  RML nodule  - will need 62m FU CT  Plan was discussed with pt, his wife & son in detail   Cyril Mourning MD. Tonny Bollman. [facility] Pager [phone] If no response call [phone]    09/18/2012, 3:53 PM

## 2012-09-18 NOTE — Telephone Encounter (Signed)
Er Dr Bonnielee Haff ct chest showed pos PE minimal subsegment RLL branch Also significant for 7 mm nodule susp for CA and pos small liver lesion

## 2012-09-19 ENCOUNTER — Encounter (HOSPITAL_COMMUNITY): Payer: Self-pay | Admitting: General Practice

## 2012-09-19 DIAGNOSIS — J449 Chronic obstructive pulmonary disease, unspecified: Secondary | ICD-10-CM

## 2012-09-19 DIAGNOSIS — I2699 Other pulmonary embolism without acute cor pulmonale: Secondary | ICD-10-CM

## 2012-09-19 LAB — COMPREHENSIVE METABOLIC PANEL
ALT: 23 U/L (ref 0–53)
Albumin: 3.5 g/dL (ref 3.5–5.2)
Alkaline Phosphatase: 60 U/L (ref 39–117)
BUN: 20 mg/dL (ref 6–23)
Calcium: 9 mg/dL (ref 8.4–10.5)
GFR calc Af Amer: 55 mL/min — ABNORMAL LOW (ref >90)
Potassium: 4.1 mEq/L (ref 3.5–5.1)
Sodium: 138 mEq/L (ref 135–145)
Total Protein: 6.9 g/dL (ref 6.0–8.3)

## 2012-09-19 LAB — DIFFERENTIAL
Eosinophils Absolute: 0.3 10*3/uL (ref 0.0–0.7)
Lymphocytes Relative: 20 % (ref 12–46)
Lymphs Abs: 1.5 10*3/uL (ref 0.7–4.0)
Monocytes Relative: 10 % (ref 3–12)
Neutrophils Relative %: 66 % (ref 43–77)

## 2012-09-19 LAB — CBC
HCT: 60.2 % — ABNORMAL HIGH (ref 39.0–52.0)
MCH: 32.6 pg (ref 26.0–34.0)
MCV: 96.3 fL (ref 78.0–100.0)
Platelets: 160 10*3/uL (ref 150–400)
RBC: 6.25 MIL/uL — ABNORMAL HIGH (ref 4.22–5.81)
WBC: 7.7 10*3/uL (ref 4.0–10.5)

## 2012-09-19 MED ORDER — WARFARIN SODIUM 5 MG PO TABS: 5.0000 mg | ORAL_TABLET | Freq: Once | ORAL | Status: DC

## 2012-09-19 MED ORDER — WARFARIN SODIUM 5 MG PO TABS
5.0000 mg | ORAL_TABLET | Freq: Once | ORAL | Status: DC
  Filled 2012-09-19: qty 1

## 2012-09-19 MED ORDER — ENOXAPARIN SODIUM 30 MG/0.3ML ~~LOC~~ SOLN: 90.0000 mg | Freq: Two times a day (BID) | SUBCUTANEOUS | Status: DC

## 2012-09-19 NOTE — Progress Notes (Signed)
DUplex rt popliteal DVt noted Discussed with patient- he can discharge on lovenox bridge to coumadin , INR can be monitored at [facility] if available or with dr hodgin (PCP) or at [facility] coumadin clinic  [facility]

## 2012-09-19 NOTE — Progress Notes (Signed)
ANTICOAGULATION CONSULT NOTE -   Pharmacy Consult for lovenox, coumadin Indication: pulmonary embolus  Allergies  Allergen Reactions  . Sulfamethoxazole     REACTION: "washes" him out per pt    Patient Measurements: Height: 5\' 9"  (175.3 cm) Weight: 199 lb (90.266 kg) IBW/kg (Calculated) : 70.7   Vital Signs: Temp: 97.3 F (36.3 C) (12/24 0523) Temp src: Oral (12/24 0523) BP: 140/90 mmHg (12/24 0553) Pulse Rate: 62  (12/24 0523)  Labs:  Basename 09/19/12 0605 09/18/12 1624 09/18/12 1619  HGB 20.4* -- --  HCT 60.2* -- --  PLT 160 -- --  APTT -- -- --  LABPROT 14.9 14.1 --  INR 1.19 1.10 --  HEPARINUNFRC -- -- --  CREATININE 1.33 -- --  CKTOTAL -- -- --  CKMB -- -- --  TROPONINI -- -- <0.30    Estimated Creatinine Clearance: 45.9 ml/min (by C-G formula based on Cr of 1.33).   Medical History: Past Medical History  Diagnosis Date  . Obstructive sleep apnea (adult) (pediatric)   . Chronic airway obstruction, not elsewhere classified   . Unspecified essential hypertension   . Cerebrovascular disease, unspecified   . Other and unspecified hyperlipidemia   . Diaphragmatic hernia without mention of obstruction or gangrene   . Diverticulosis of colon (without mention of hemorrhage)   . Benign neoplasm of colon   . Overweight     Medications:  Prescriptions prior to admission  Medication Sig Dispense Refill  . acetaminophen (TYLENOL ARTHRITIS PAIN) 650 MG CR tablet Take 650 mg by mouth every other day.       . clopidogrel (PLAVIX) 75 MG tablet Take 1 tablet (75 mg total) by mouth daily.  30 tablet  11  . fish oil-omega-3 fatty acids 1000 MG capsule Take 2 g by mouth 2 (two) times daily.        . Fluticasone-Salmeterol (ADVAIR DISKUS) 250-50 MCG/DOSE AEPB Inhale 1 puff into the lungs every 12 (twelve) hours.  60 each  11  . guaiFENesin (MUCINEX) 600 MG 12 hr tablet Take 1,200 mg by mouth 2 (two) times daily.       . Multiple Vitamins-Minerals (CENTRUM SILVER PO)  Take 1 capsule by mouth daily.        . simvastatin (ZOCOR) 40 MG tablet Take 1 tablet (40 mg total) by mouth at bedtime.  30 tablet  11  . telmisartan-hydrochlorothiazide (MICARDIS HCT) 40-12.5 MG per tablet Take 1 tablet by mouth at bedtime.      Marland Kitchen tiotropium (SPIRIVA) 18 MCG inhalation capsule Place 1 capsule (18 mcg total) into inhaler and inhale daily.  30 capsule  11  . [DISCONTINUED] telmisartan-hydrochlorothiazide (MICARDIS HCT) 40-12.5 MG per tablet Take 1 tablet by mouth daily.  30 tablet  11    Assessment: 78 yo man admitted with PE to start lovenox and coumadin.  This will be day 2/5 overlap. Goal of Therapy:  Anti-Xa level 0.6-1.2 units/ml 4hrs after LMWH dose given Monitor platelets by anticoagulation protocol: Yes INR 2-3   Plan:  Continue Lovenox 90 mg sq q12 hours CBC q 3 days. Coumadin 5 mg again today. Daily PT/INR  Talbert Cage Poteet 09/19/2012,9:03 AM

## 2012-09-19 NOTE — Discharge Summary (Signed)
Physician Discharge Summary  Patient ID: Tracy [name] MRN: [id number] DOB/AGE: 1925/11/17 78 y.o.  Admit date: 09/18/2012 Discharge date: 09/19/2012  Problem List Active Problems:  Pulmonary embolism  HPI: 78 y/o WM with OSA; COPD seen in the office on 09/12/2012  Reporting Class 3 dyspnea  desatn (on his own oximeter) with minmal exertion inspite of oxygen use.  No sputum production, recent URI symptoms, or pedal edema  Satn ok on 3l pulse at rest but required 6L continuous on walking  Ct angio was ordered which showed Subsegmental acute pulmonary thromboembolism in the right lower lobe, 7 mm spiculated lung lesion in the right middle lobe & bullous emphysema  HosASSESSMENT / PLAN:  Acute respiratory failure with worsening hypoxia  COPD  -Titrate O2 will need continuous on exertion 4-6L  -advair/spiriva/ nebs prn  Pulmonary embolism - Lovenox Rx doses  Discussed pros & cons of anticoagulant classes - he prefers coumadin  Coumadin 5 mg daily with daily INR. LMWH at 90mg  Q12h till INR at goal His coumadin will be administered by Dr Rodena Medin 9PCP) or [place] [facility]. [facility] can monitor INR. Will plan for 3-6 mnths depending on course  STOP plavix  Doppler LEs for completion (Rt popliteal DVT noted) RML nodule - will need 76m FU CT         Labs at discharge Lab Results  Component Value Date   CREATININE 1.33 09/19/2012   BUN 20 09/19/2012   NA 138 09/19/2012   K 4.1 09/19/2012   CL 101 09/19/2012   CO2 24 09/19/2012   Lab Results  Component Value Date   WBC 7.7 09/19/2012   HGB 20.4* 09/19/2012   HCT 60.2* 09/19/2012   MCV 96.3 09/19/2012   PLT 160 09/19/2012   Lab Results  Component Value Date   ALT 23 09/19/2012   AST 25 09/19/2012   ALKPHOS 60 09/19/2012   BILITOT 0.7 09/19/2012   Lab Results  Component Value Date   INR 1.19 09/19/2012   INR 1.10 09/18/2012    Current radiology studies Ct Angio Chest Pe W/cm &/or Wo  Cm  09/18/2012  *RADIOLOGY REPORT*  Clinical Data: Hypoxia  CT ANGIOGRAPHY CHEST  Technique:  Multidetector CT imaging of the chest using the standard protocol during bolus administration of intravenous contrast. Multiplanar reconstructed images including MIPs were obtained and reviewed to evaluate the vascular anatomy.  Contrast: 80mL OMNIPAQUE IOHEXOL 350 MG/ML SOLN  Comparison: 05/28/2004  Findings: There is a subtle low density filling defect in a subsegmental branch of the anterior basal segment of the right lower lobe.  See images 206 through 208 of series 5.  Three-vessel coronary artery calcification as well as the left main coronary artery.  Mild aortic valve calcifications.  Negative abnormal mediastinal adenopathy.  No pericardial effusion.  No pneumothorax.  No pleural effusion.  Severe centrilobular emphysema.  Spiculated 7 mm nodule in the right middle lobe on image 56.  No destructive bone lesion.  Low density in the lateral segment of the left lobe is partially imaged on image 89 worrisome for a liver lesion.  IMPRESSION: Subsegmental acute pulmonary thromboembolism in the right lower lobe.  7 mm spiculated lung lesion in the right middle lobe worrisome for bronchogenic carcinoma.  Liver lesion is suspected and partially imaged. Liver MRI is recommended to further characterize.  Severe chronic changes of the lung parenchyma. Critical Value/emergent results were called by telephone at the time of interpretation on 09/18/2012 at 1117 hours to [place],  who verbally acknowledged these results.   Original Report Authenticated By: Jolaine Click, M.D.     Disposition:  01-Home or Self Care  Discharge Orders    Future Appointments: Provider: Department: Dept Phone: Center:   09/26/2012 9:45 AM Oretha Milch, MD [facility] [phone] None   10/11/2012 9:00 AM Edwyna Perfect, MD [facility]  [facility] [phone] [email or web address]     Future Orders Please Complete By Expires    Discharge patient          Medication List     As of 09/19/2012 12:56 PM    STOP taking these medications         CENTRUM SILVER PO      clopidogrel 75 MG tablet   Commonly known as: PLAVIX      TAKE these medications         enoxaparin 30 MG/0.3ML injection   Commonly known as: LOVENOX   Inject 0.9 mLs (90 mg total) into the skin every 12 (twelve) hours.      fish oil-omega-3 fatty acids 1000 MG capsule   Take 2 g by mouth 2 (two) times daily.      Fluticasone-Salmeterol 250-50 MCG/DOSE Aepb   Commonly known as: ADVAIR   Inhale 1 puff into the lungs every 12 (twelve) hours.      guaiFENesin 600 MG 12 hr tablet   Commonly known as: MUCINEX   Take 1,200 mg by mouth 2 (two) times daily.      simvastatin 40 MG tablet   Commonly known as: ZOCOR   Take 1 tablet (40 mg total) by mouth at bedtime.      telmisartan-hydrochlorothiazide 40-12.5 MG per tablet   Commonly known as: MICARDIS HCT   Take 1 tablet by mouth at bedtime.      tiotropium 18 MCG inhalation capsule   Commonly known as: SPIRIVA   Place 1 capsule (18 mcg total) into inhaler and inhale daily.      TYLENOL ARTHRITIS PAIN 650 MG CR tablet   Generic drug: acetaminophen   Take 650 mg by mouth every other day.      warfarin 5 MG tablet   Commonly known as: COUMADIN   Take 1 tablet (5 mg total) by mouth one time only at 6 PM.         Discharged Condition: {condition:good) Vital signs at Discharge. Temp:  [97.3 F (36.3 C)-98.2 F (36.8 C)] 97.3 F (36.3 C) (12/24 0523) Pulse Rate:  [62-118] 62  (12/24 0523) Resp:  [18-22] 20  (12/24 0523) BP: (106-156)/(90-106) 140/90 mmHg (12/24 0553) SpO2:  [90 %-93 %] 90 % (12/24 0825) Weight:  [90.266 kg (199 lb)] 90.266 kg (199 lb) (12/23 1412) Office follow up Special Information or instructions. He will have [facility] till [facility] [place]. Signed: Brett Canales Minor ACNP Adolph Pollack PCCM Pager [phone] till 3 pm If no answer page [phone] 09/19/2012, 12:56  PM  Discussed discharge plan with pt in full  Cyril Mourning MD

## 2012-09-19 NOTE — Care Management Note (Addendum)
    Page 1 of 1   09/19/2012     2:28:28 PM   CARE MANAGEMENT NOTE 09/19/2012  Patient:  Tracy [name], Tracy [name]   Account Number:  [id number]  Date Initiated:  09/19/2012  Documentation initiated by:  Junius Creamer  Subjective/Objective Assessment:   adm w pul embolus     Action/Plan:   lives at [facility] indep living w wife   Anticipated DC Date:  09/19/2012   Anticipated DC Plan:  HOME/SELF CARE  In-house referral  Clinical Social Worker      DC Planning Services  CM consult      Choice offered to / List presented to:             Status of service:   Medicare Important Message given?   (If response is "NO", the following Medicare IM given date fields will be blank) Date Medicare IM given:   Date Additional Medicare IM given:    Discharge Disposition:  HOME/SELF CARE  Per UR Regulation:  Reviewed for med. necessity/level of care/duration of stay  If discussed at Long Length of Stay Meetings, dates discussed:    Comments:  12/24 0851 debbie Lilya Smitherman rn,bsn pt has 15% of cost of med copay per cm sec.spoke w pt-wife-son. pt plans to give lovenox to himself or go to snf and have nse there adm. he has 2 ins for lovnenox and not concerned about cost of med. for dc home today. son callling another fam member to pick up meds as pt's pharm closes at 3pm.

## 2012-09-19 NOTE — Progress Notes (Signed)
Clinical Social Work Department BRIEF PSYCHOSOCIAL ASSESSMENT 09/19/2012  Patient:  Tracy [name], Tracy [name]     Account Number:  [id number]     Admit date:  09/18/2012  Clinical Social Worker:  Dennison Bulla  Date/Time:  09/19/2012 12:15 PM  Referred by:  Physician  Date Referred:  09/19/2012 Referred for  ALF Placement   Other Referral:   Interview type:  Patient Other interview type:    PSYCHOSOCIAL DATA Living Status:  FACILITY Admitted from facility:  [facility] Level of care:  Independent Living Primary support name:  Barbette Or Primary support relationship to patient:  SPOUSE Degree of support available:   Strong    CURRENT CONCERNS Current Concerns  Post-Acute Placement   Other Concerns:    SOCIAL WORK ASSESSMENT / PLAN CSW received referral due to patient being admitted from a facility. CSW reviewed chart and met with patient at bedside. No visitors present.    CSW introduced myself and explained role. Patient reports that he is dc today back to [facility]. Patient reports he lives in ind living with wife and has no CSW needs. Patient reports that wife and son are coming to hospital to transport him home    CSW is signing off.   Assessment/plan status:  No Further Intervention Required Other assessment/ plan:   Information/referral to community resources:   Will return to [facility]    PATIENT'S/FAMILY'S RESPONSE TO PLAN OF CARE: Patient alert and oriented. Patient appreciative of CSW consult but reports no needs.

## 2012-09-19 NOTE — Progress Notes (Signed)
VASCULAR LAB PRELIMINARY  PRELIMINARY  PRELIMINARY  PRELIMINARY  Bilateral lower extremity venous duplex  completed.    Preliminary report: On the right a short segment DVT was noted at the confluence of the popliteal and gastrocnemius veins.  The DVT is in the gastroncemius vein and extends up, but not into the popliteal vein.  The left leg is negative for deep and superficial vein thrombosis.  Ophie Burrowes, RVT 09/19/2012, 11:03 AM

## 2012-09-21 ENCOUNTER — Telehealth: Payer: Self-pay | Admitting: Pulmonary Disease

## 2012-09-21 ENCOUNTER — Other Ambulatory Visit (INDEPENDENT_AMBULATORY_CARE_PROVIDER_SITE_OTHER): Payer: 59

## 2012-09-21 ENCOUNTER — Other Ambulatory Visit: Payer: 59

## 2012-09-21 DIAGNOSIS — I2699 Other pulmonary embolism without acute cor pulmonale: Secondary | ICD-10-CM

## 2012-09-21 LAB — PROTIME-INR
INR: 1.6 ratio — ABNORMAL HIGH (ref 0.8–1.0)
Prothrombin Time: 16.5 s — ABNORMAL HIGH (ref 10.2–12.4)

## 2012-09-21 NOTE — Telephone Encounter (Signed)
Pt called back.  Pt states that his blood test was to be this AM to adjust his Rx.  Pt is upset that he hasn't heard anything back yet.

## 2012-09-21 NOTE — Telephone Encounter (Signed)
Pl set him up with coumadin clinic at [facility] & they can coordinate with [facility] later He does need check today

## 2012-09-21 NOTE — Telephone Encounter (Signed)
PLEASE REFER TO COUMADIN CLINIC AT [facility] PLEASE - to direct coumadin dosing - he can get subsequent labs at [facility]

## 2012-09-21 NOTE — Telephone Encounter (Signed)
Per pt's discharge:  HosASSESSMENT / PLAN:  Acute respiratory failure with worsening hypoxia  COPD  -Titrate O2 will need continuous on exertion 4-6L  -advair/spiriva/ nebs prn  Pulmonary embolism - Lovenox Rx doses  Discussed pros & cons of anticoagulant classes - he prefers coumadin  Coumadin 5 mg daily with daily INR. LMWH at 90mg  Q12h till INR at goal  His coumadin will be administered by Dr Rodena Medin 9PCP) or [facility] coumadin clinic. [facility] can monitor INR.  Will plan for 3-6 mnths depending on course  STOP plavix  Doppler LEs for completion (Rt popliteal DVT noted)  RML nodule - will need 42m FU CT  =========================================================== No coumadin clinic referral placed.  [facility] at 4 different phone numbers to see if orders were sent to pt's AL facility for this but no numbers were working.  Called spoke with patient who stated that [employer] does have the facility to check his INR but the clinic is closed.  To the best of pt's knowledge, nothing has been set up yet.  Pt would like to know if his INR indeed needs to be checked today.  Pt okay to come to the [place] office to have this done.  It is unable to be done at the [facility] office as all blood samples are sent to [facility].  Will forward to RA for recs and send a page.  Please advise, thanks.

## 2012-09-21 NOTE — Telephone Encounter (Signed)
Pt calling again in ref to previous msg can be reached at [phone].Tracy [name]

## 2012-09-21 NOTE — Telephone Encounter (Signed)
Called spoke with patient who will come for his labs today.  Pt wants to have this checked at [facility] going forward rather than driving from [place].  Per the discharge, pt's PCP is to administer pt's coumadin.  Will forward to RA so that he is aware pt's INR is pending.  Order has been placed and pt is aware we will contact him once the results are available.

## 2012-09-21 NOTE — Telephone Encounter (Signed)
Referral to [facility] coumadin clinic placed.  Will sign off.

## 2012-09-22 ENCOUNTER — Ambulatory Visit (INDEPENDENT_AMBULATORY_CARE_PROVIDER_SITE_OTHER): Payer: 59 | Admitting: General Practice

## 2012-09-22 DIAGNOSIS — I2699 Other pulmonary embolism without acute cor pulmonale: Secondary | ICD-10-CM

## 2012-09-25 ENCOUNTER — Telehealth: Payer: Self-pay | Admitting: Pulmonary Disease

## 2012-09-25 NOTE — Telephone Encounter (Signed)
I spoke with patient about results and he verbalized understanding and had no questions. Pt has an appt scheduled for 09/26/30 at 9 AM. He is wanting to know if RA feels like he needs to keep this appt. Please advise Dr. Vassie Loll thanks

## 2012-09-25 NOTE — Telephone Encounter (Signed)
PFTs -12/13- fev1 52% (1.30 ) -unchanged from last DLCO 29%  ONO on cpap + 3L O2 shows drop in satn x 2h  Expect this to improve as blood clot dissolves with time

## 2012-09-25 NOTE — Telephone Encounter (Signed)
Pt is aware. Nothing further was needed 

## 2012-09-25 NOTE — Telephone Encounter (Signed)
y- would like to rechk O2 levels

## 2012-09-26 ENCOUNTER — Ambulatory Visit (INDEPENDENT_AMBULATORY_CARE_PROVIDER_SITE_OTHER): Payer: 59 | Admitting: Pulmonary Disease

## 2012-09-26 ENCOUNTER — Encounter: Payer: Self-pay | Admitting: Pulmonary Disease

## 2012-09-26 ENCOUNTER — Ambulatory Visit (INDEPENDENT_AMBULATORY_CARE_PROVIDER_SITE_OTHER): Payer: 59 | Admitting: General Practice

## 2012-09-26 VITALS — BP 128/82 | HR 71 | Temp 97.5°F | Ht 70.0 in | Wt 205.0 lb

## 2012-09-26 DIAGNOSIS — I2699 Other pulmonary embolism without acute cor pulmonale: Secondary | ICD-10-CM

## 2012-09-26 DIAGNOSIS — J449 Chronic obstructive pulmonary disease, unspecified: Secondary | ICD-10-CM

## 2012-09-26 NOTE — Progress Notes (Signed)
  Subjective:    Patient ID: Tracy [name], male    DOB: 1926-07-02, 78 y.o.   MRN: [id number]  HPI PCP- [place]   78 y/o WM for FU of OSA; COPD  OBSTRUCTIVE SLEEP APNEA - ~ sleep study 3/06 w/ RDI 26 w/ desat to 76%...  COPD - on home O2 .  ~ PFT 11/04 showed FVC=4.51 (103%), FEV1=1.93 (67%) and DLCO~50%...  ~ CT Chest 9/05 & CT abd 1/12 showed marked emphysema w/ blebs, & thor spondylosis...  ~ Office spirometry 11/05 showed FEV1=1.57 (48%)...  ~ PFT 4/10 showed FVC= 3.00 (77%), FEV1= 1.37 (45%)  11/29/2011 Acute OV >> improved with avelox + prednisone taper    09/26/2012 Presented with worsening hypoxia Ct angio showed Subsegmental acute pulmonary thromboembolism in the right lower lobe, 7 mm spiculated lung lesion in the right middle lobe & bullous emphysema  DUplex rt popliteal DVt noted  PFTs -12/13- fev1 52% (1.30 ) -unchanged from last  DLCO 29%  ONO on cpap + 3L O2 shows drop in satn x 2h  breathing has unchanged. cough w/ brown phlem. no wheezing, no chest tx  Review of Systems     Objective:   Physical Exam        Assessment & Plan:

## 2012-09-26 NOTE — Assessment & Plan Note (Signed)
Stay on continuous O2 4L at rest & 6l when walking Ct advair/spiriva

## 2012-09-26 NOTE — Assessment & Plan Note (Signed)
Coumadin until 6/14 INR being monitored at [facility] Watch blood blisters- expect to decrease now that off lovenox

## 2012-09-26 NOTE — Patient Instructions (Signed)
Aim for coumadin level of 2.0-2.5 Ct advair/ spiriva Stay on continuous O2 4L at rest & 6l when walking

## 2012-10-04 ENCOUNTER — Ambulatory Visit (INDEPENDENT_AMBULATORY_CARE_PROVIDER_SITE_OTHER): Payer: 59 | Admitting: General Practice

## 2012-10-04 DIAGNOSIS — I2699 Other pulmonary embolism without acute cor pulmonale: Secondary | ICD-10-CM

## 2012-10-04 LAB — HEPATIC FUNCTION PANEL
ALT: 40 U/L (ref 0–53)
AST: 27 U/L (ref 0–37)
Albumin: 4.1 g/dL (ref 3.5–5.2)
Alkaline Phosphatase: 59 U/L (ref 39–117)
Bilirubin, Direct: 0.1 mg/dL (ref 0.0–0.3)
Indirect Bilirubin: 0.3 mg/dL (ref 0.0–0.9)
Total Bilirubin: 0.4 mg/dL (ref 0.3–1.2)
Total Protein: 6.4 g/dL (ref 6.0–8.3)

## 2012-10-04 LAB — LIPID PANEL
Cholesterol: 162 mg/dL (ref 0–200)
HDL: 48 mg/dL (ref >39)
LDL Cholesterol: 89 mg/dL (ref 0–99)
Total CHOL/HDL Ratio: 3.4 ratio
Triglycerides: 124 mg/dL (ref <150)
VLDL: 25 mg/dL (ref 0–40)

## 2012-10-04 LAB — BASIC METABOLIC PANEL WITH GFR
BUN: 23 mg/dL (ref 6–23)
CO2: 27 meq/L (ref 19–32)
Calcium: 8.9 mg/dL (ref 8.4–10.5)
Chloride: 107 meq/L (ref 96–112)
Creat: 1.23 mg/dL (ref 0.50–1.35)
Glucose, Bld: 96 mg/dL (ref 70–99)
Potassium: 4.6 meq/L (ref 3.5–5.3)
Sodium: 143 meq/L (ref 135–145)

## 2012-10-04 LAB — POCT INR: INR: 2

## 2012-10-04 NOTE — Addendum Note (Signed)
Addended by: Regis Bill on: 10/04/2012 09:23 AM   Modules accepted: Orders

## 2012-10-04 NOTE — Progress Notes (Signed)
Lab orders released/SLS 

## 2012-10-05 LAB — CBC WITH DIFFERENTIAL/PLATELET
Basophils Absolute: 0 10*3/uL (ref 0.0–0.1)
Basophils Relative: 0 % (ref 0–1)
HCT: 56.1 % — ABNORMAL HIGH (ref 39.0–52.0)
Lymphocytes Relative: 19 % (ref 12–46)
MCHC: 34.2 g/dL (ref 30.0–36.0)
Monocytes Absolute: 0.7 10*3/uL (ref 0.1–1.0)
Neutro Abs: 4.6 10*3/uL (ref 1.7–7.7)
Neutrophils Relative %: 66 % (ref 43–77)
Platelets: 187 10*3/uL (ref 150–400)
RDW: 14.2 % (ref 11.5–15.5)
WBC: 7.1 10*3/uL (ref 4.0–10.5)

## 2012-10-06 ENCOUNTER — Encounter: Payer: Self-pay | Admitting: Pulmonary Disease

## 2012-10-11 ENCOUNTER — Encounter: Payer: Self-pay | Admitting: Internal Medicine

## 2012-10-11 ENCOUNTER — Telehealth: Payer: Self-pay | Admitting: Hematology & Oncology

## 2012-10-11 ENCOUNTER — Ambulatory Visit (INDEPENDENT_AMBULATORY_CARE_PROVIDER_SITE_OTHER): Payer: 59 | Admitting: Internal Medicine

## 2012-10-11 VITALS — BP 141/78 | HR 67 | Temp 98.1°F | Resp 16 | Wt 203.5 lb

## 2012-10-11 DIAGNOSIS — I2699 Other pulmonary embolism without acute cor pulmonale: Secondary | ICD-10-CM

## 2012-10-11 DIAGNOSIS — I10 Essential (primary) hypertension: Secondary | ICD-10-CM

## 2012-10-11 MED ORDER — LOSARTAN POTASSIUM 50 MG PO TABS: 50.0000 mg | ORAL_TABLET | Freq: Every day | ORAL | Status: DC

## 2012-10-11 NOTE — Assessment & Plan Note (Signed)
Unprovoked (?contribution from secondary PCV). Recommend hematology evaluation.

## 2012-10-11 NOTE — Assessment & Plan Note (Signed)
Change micardis to losartan due to insurance coverage. Recommend regular outpt bp monitoring.

## 2012-10-11 NOTE — Progress Notes (Signed)
  Subjective:    Patient ID: Tracy [name], male    DOB: [birth year], 79 y.o.   MRN: [id number]  HPI Pt presents to clinic for followup of multiple medical problems. Recently dx'ed with DVT/PE by pulmonary and placed on coumadin. Tolerating without blood in urine or stool. States insurance will no longer cover micardis and needs alternative. Reviewed labs including persistently elevated hgb in the setting of oxygen dependent copd.   Past Medical History  Diagnosis Date  . Chronic airway obstruction, not elsewhere classified   . Unspecified essential hypertension   . Cerebrovascular disease, unspecified   . Other and unspecified hyperlipidemia   . Diaphragmatic hernia without mention of obstruction or gangrene   . Diverticulosis of colon (without mention of hemorrhage)   . Benign neoplasm of colon   . Overweight   . OSA on CPAP   . On home oxygen therapy    Past Surgical History  Procedure Date  . Hernia repair   . Nephroureterectomy     reports that he quit smoking about 34 years ago. His smoking use included Cigarettes. He smoked 1.5 packs per day. He has never used smokeless tobacco. He reports that he does not drink alcohol or use illicit drugs. family history is not on file. Allergies  Allergen Reactions  . Sulfamethoxazole     REACTION: "washes" him out per pt      Review of Systems see hpi     Objective:   Physical Exam  Nursing note and vitals reviewed. Constitutional: He appears well-developed and well-nourished. No distress.  HENT:  Head: Normocephalic and atraumatic.  Eyes: Conjunctivae normal are normal. No scleral icterus.  Pulmonary/Chest: Effort normal and breath sounds normal. No respiratory distress. He has no wheezes. He has no rales.  Neurological: He is alert.  Skin: Skin is warm and dry. He is not diaphoretic.  Psychiatric: He has a normal mood and affect.           Assessment & Plan:

## 2012-10-11 NOTE — Telephone Encounter (Signed)
Pt aware of 1-20 appointment said he would call back if he can't come in.

## 2012-10-13 ENCOUNTER — Other Ambulatory Visit: Payer: Self-pay | Admitting: Medical

## 2012-10-13 DIAGNOSIS — I2699 Other pulmonary embolism without acute cor pulmonale: Secondary | ICD-10-CM

## 2012-10-16 ENCOUNTER — Ambulatory Visit (HOSPITAL_BASED_OUTPATIENT_CLINIC_OR_DEPARTMENT_OTHER): Payer: 59 | Admitting: Hematology & Oncology

## 2012-10-16 ENCOUNTER — Other Ambulatory Visit (HOSPITAL_BASED_OUTPATIENT_CLINIC_OR_DEPARTMENT_OTHER): Payer: 59 | Admitting: Lab

## 2012-10-16 ENCOUNTER — Ambulatory Visit: Payer: 59

## 2012-10-16 VITALS — BP 148/100 | HR 100 | Temp 97.9°F | Resp 24 | Wt 205.0 lb

## 2012-10-16 DIAGNOSIS — Z85528 Personal history of other malignant neoplasm of kidney: Secondary | ICD-10-CM

## 2012-10-16 DIAGNOSIS — C679 Malignant neoplasm of bladder, unspecified: Secondary | ICD-10-CM

## 2012-10-16 DIAGNOSIS — I2699 Other pulmonary embolism without acute cor pulmonale: Secondary | ICD-10-CM

## 2012-10-16 DIAGNOSIS — J449 Chronic obstructive pulmonary disease, unspecified: Secondary | ICD-10-CM

## 2012-10-16 DIAGNOSIS — D751 Secondary polycythemia: Secondary | ICD-10-CM

## 2012-10-16 DIAGNOSIS — Z86711 Personal history of pulmonary embolism: Secondary | ICD-10-CM

## 2012-10-16 DIAGNOSIS — Z86718 Personal history of other venous thrombosis and embolism: Secondary | ICD-10-CM

## 2012-10-16 LAB — CBC WITH DIFFERENTIAL (CANCER CENTER ONLY)
BASO%: 0.5 % (ref 0.0–2.0)
EOS%: 3.9 % (ref 0.0–7.0)
LYMPH#: 1.3 10*3/uL (ref 0.9–3.3)
MCHC: 33.4 g/dL (ref 32.0–35.9)
NEUT#: 5.6 10*3/uL (ref 1.5–6.5)
Platelets: 147 10*3/uL (ref 145–400)
RDW: 14.6 % (ref 11.1–15.7)

## 2012-10-16 NOTE — Progress Notes (Signed)
This office note has been dictated.

## 2012-10-16 NOTE — Progress Notes (Signed)
CC:   Marguarite Arbour, MD Oretha Milch, MD  DIAGNOSES: 1. 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 2. Pulmonary embolism/deep venous thrombosis.  HISTORY OF PRESENT ILLNESS:  Mr. Radin is a nice 79 year old white gentleman.  He is a former Conservation officer, historic buildings.  He actually did time over in [place] during the [place] war.  He has not smoked for 34 years.  However, he has oxygen dependent COPD. He also has sleep apnea.  He noted back in December that he just was getting more short of breath. There was no change in medications.  He had not noted any swelling in his legs.  He saw Dr. Vassie Loll.  Dr. Vassie Loll did some tests.  Subsequently, he was found to have a pulmonary embolism.  A CT angiogram done on December 23 showed subsegmental acute pulmonary embolism in the right lower lobe.  Also what was noted was a 7 mm right upper lobe lesion that was spiculated and concerning for bronchogenic carcinoma.  There was "liver lesion is suspected".  MRI was recommended.  He was placed on Lovenox.  He currently is on Coumadin.  He had Doppler of his lower legs on December 24.  The Doppler test showed that there was DVT at the confluence of the gastrocnemius vein and the popliteal vein in the right lower leg.  This did not extend into the popliteal vein.  He does have erythrocytosis.  Lab work done back in December showed hemoglobin to be 20.4, hematocrit 60.2.  White cell was 7.7 and platelets were 160.  His last PSA was 1.4.  This was back in April of 2012.  There was no renal insufficiency.  His metabolic panel done in December shows BUN to be 20, creatinine 1.33.  Calcium was 9 with an albumin 3.5.  Of note, he says that he did have kidney cancer probably over 10 years ago.  He says this was a small cancer that was removed by Dr. [place]. He said he had his whole kidney taken out.  This was back in October of 2003.  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 It was  low-grade.  Again, the whole kidney was removed.  The tumor itself measured 1.2 cm.  Mr. Jain was kindly referred to the [facility] by Dr. Rodena Medin so we could evaluate for the possibility of polycythemia as a potential cause for the thromboembolic event.  Mr. Lammert has not gained or lost weight.  He is on oxygen chronically now.  Again, he is on CPAP.  He has had sleep apnea I think for about 8 years.  PAST MEDICAL HISTORY:  Remarkable for: 1. COPD. 2. Hypertension. 3. History of CVA.  The patient was on Plavix. 4. 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 5. Obstructive sleep apnea.  ALLERGIES:  Sulfa.  MEDICATIONS: 1. Advair Diskus (250/50) 1 puff b.i.d. 2. Cozaar 50 mg p.o. daily. 3. Zocor 40 mg p.o. daily. 4. Spiriva 18 mcg inhaler daily. 5. Coumadin 5 mg p.o. daily.  SOCIAL HISTORY:  Remarkable for past tobacco use.  He probably has about a 70 pack year history of tobacco use.  Again, he stopped about 35 years ago.  He has no significant alcohol use.  He does have a drink every once in a while.  He has no obvious occupational exposures.  After the [employer] he was working for [employer] and [employer].  FAMILY HISTORY:  Negative for any obvious thromboembolic events  in the family.  He thinks there is no history of cancer in the family.  He thinks his mother may have had lung disease from asbestos.  REVIEW OF SYSTEMS:  As stated in history of present illness.  No additional findings are noted on 12 system review.  PHYSICAL EXAMINATION:  General:  This is an elderly but fairly well- nourished white gentleman in no obvious distress.  He does have a little bit of a "ruddy" complexion to him.  He is wearing oxygen.  He is alert and oriented x3.  Vital signs:  Temperature 97.9, pulse 100, respiratory rate 24, blood pressure 148/100.  Weight is 205 pounds.  Head and neck: Shows a normocephalic, atraumatic skull.  He  does have some slight facial plethora.  There are no oral lesions.  He has some slight conjunctival redness.  No adenopathy noted in the neck.  Thyroid is not palpable.  Lungs:  Clear to percussion and auscultation bilaterally. Cardiac:  Regular rate and rhythm with a normal S1 and S2.  There are no murmurs, rubs or bruits.  Abdomen:  Soft with good bowel sounds.  There is no palpable abdominal mass.  There is no fluid wave.  There is no palpable hepatosplenomegaly.  Back:  No tenderness over the spine, ribs or hips.  Extremities:  Shows no clubbing, cyanosis or edema.  He has decent range of motion of his joints.  I cannot palpate any obvious venous cord in his legs.  Skin:  Shows a ruddy complexion. Neurological:  Shows no focal neurological deficits.  LABORATORY STUDIES:  White cell count is 8.3, hemoglobin 19, hematocrit 57, platelet count 147.  MCV is 95.  IMPRESSION:  Mr. Fildes is an 78 year old gentleman with pulmonary embolism/deep venous thrombosis.  I would highly doubt that this is a hypercoagulable condition that is hereditary.  At 79 years old it is highly unlikely to all of the sudden present with a thrombophilic state.  It is certainly possible, however, that the erythrocytosis could be a factor.  He does have significant erythrocytosis.  This is probably from his underlying chronic obstructive pulmonary disease.  I am sending off a JAK2 assay so that we can make sure that this is not a primary polycythemia vera.  I am sending off his ferritin and an erythropoietin level on him.  It is certainly possible that we may need to phlebotomize him to try to get his hemoglobin down a little bit to try to decrease his blood viscosity.  I do not see a need for a bone marrow biopsy on him.  We should be able to get what we need with lab work.  It is certainly possible that his history of sleep apnea could be playing a role.  I sincerely doubt that the history of the  transitional cell carcinoma of his left kidney is a factor.  This was 10 years ago.  This was early stage disease.  This, I believe, he is cured from.  Mr. Broady is a real nice guy.  I had a really nice time talking to him. He certainly is well versed in many different subjects.  I think that duration of anticoagulation is going to be a little bit "tricky".  I would suspect that he is probably going to need 1 year of anticoagulation.  I think we really need to see about trying to get his "blood viscosity" down some.  We may have to get him on a phlebotomy program.  We will see  what the lab work shows.  I will plan to get him back once I get his lab results back and then we can figure out how we can try to best follow his thromboembolic disease.    ______________________________ Josph Macho, M.D. PRE/MEDQ  D:  10/16/2012  T:  10/16/2012  Job:  [id number]

## 2012-10-17 LAB — ERYTHROPOIETIN: Erythropoietin: 16.8 m[IU]/mL (ref 2.6–18.5)

## 2012-10-18 ENCOUNTER — Ambulatory Visit (INDEPENDENT_AMBULATORY_CARE_PROVIDER_SITE_OTHER): Payer: 59 | Admitting: General Practice

## 2012-10-18 DIAGNOSIS — I2699 Other pulmonary embolism without acute cor pulmonale: Secondary | ICD-10-CM

## 2012-10-18 LAB — COMPREHENSIVE METABOLIC PANEL
AST: 23 U/L (ref 0–37)
Albumin: 4.1 g/dL (ref 3.5–5.2)
Alkaline Phosphatase: 65 U/L (ref 39–117)
Potassium: 4.6 mEq/L (ref 3.5–5.3)
Sodium: 139 mEq/L (ref 135–145)
Total Bilirubin: 0.5 mg/dL (ref 0.3–1.2)
Total Protein: 6.8 g/dL (ref 6.0–8.3)

## 2012-10-18 LAB — HYPERCOAGULABLE PANEL, COMPREHENSIVE
Anticardiolipin IgG: 17 GPL U/mL (ref <23)
Beta-2 Glyco I IgG: 1 G Units (ref <20)
Beta-2-Glycoprotein I IgA: 25 A Units — ABNORMAL HIGH (ref <20)
Lupus Anticoagulant: NOT DETECTED
Protein C Activity: 93 % (ref 75–133)
Protein C, Total: 89 % (ref 72–160)
Protein S Total: 79 % (ref 60–150)

## 2012-10-18 LAB — D-DIMER, QUANTITATIVE: D-Dimer, Quant: 0.41 ug/mL-FEU (ref 0.00–0.48)

## 2012-10-18 LAB — POCT INR: INR: 1.8

## 2012-10-19 ENCOUNTER — Telehealth: Payer: Self-pay | Admitting: General Practice

## 2012-10-19 NOTE — Telephone Encounter (Signed)
Spoke with patient to go over dosing instructions.

## 2012-10-25 ENCOUNTER — Other Ambulatory Visit: Payer: Self-pay | Admitting: Hematology & Oncology

## 2012-10-25 DIAGNOSIS — D751 Secondary polycythemia: Secondary | ICD-10-CM

## 2012-10-26 ENCOUNTER — Telehealth: Payer: Self-pay | Admitting: Hematology & Oncology

## 2012-10-26 NOTE — Telephone Encounter (Signed)
Pt aware of 2-3,2-10,2-17 phlebotomy's and 2-24 MD

## 2012-10-30 ENCOUNTER — Other Ambulatory Visit (HOSPITAL_BASED_OUTPATIENT_CLINIC_OR_DEPARTMENT_OTHER): Payer: 59 | Admitting: Lab

## 2012-10-30 ENCOUNTER — Ambulatory Visit (HOSPITAL_BASED_OUTPATIENT_CLINIC_OR_DEPARTMENT_OTHER): Payer: 59

## 2012-10-30 DIAGNOSIS — D751 Secondary polycythemia: Secondary | ICD-10-CM

## 2012-10-30 LAB — CBC WITH DIFFERENTIAL (CANCER CENTER ONLY)
Eosinophils Absolute: 0.3 10*3/uL (ref 0.0–0.5)
MONO#: 1.1 10*3/uL — ABNORMAL HIGH (ref 0.1–0.9)
NEUT#: 6.6 10*3/uL — ABNORMAL HIGH (ref 1.5–6.5)
Platelets: 164 10*3/uL (ref 145–400)
RBC: 5.85 10*6/uL — ABNORMAL HIGH (ref 4.20–5.70)
WBC: 9.2 10*3/uL (ref 4.0–10.0)

## 2012-10-30 NOTE — Progress Notes (Signed)
Tracy [name] presents today for phlebotomy per MD orders. Phlebotomy procedure started at 1245 and ended at 1300. 500 grams removed. Patient observed for 30 minutes after procedure without any incident. Patient tolerated procedure well. IV needle removed intact.

## 2012-10-30 NOTE — Patient Instructions (Signed)

## 2012-10-31 ENCOUNTER — Encounter: Payer: Self-pay | Admitting: Adult Health

## 2012-10-31 ENCOUNTER — Ambulatory Visit (INDEPENDENT_AMBULATORY_CARE_PROVIDER_SITE_OTHER): Payer: 59 | Admitting: Adult Health

## 2012-10-31 VITALS — BP 110/74 | HR 72 | Temp 97.7°F | Ht 70.0 in | Wt 204.0 lb

## 2012-10-31 DIAGNOSIS — I2699 Other pulmonary embolism without acute cor pulmonale: Secondary | ICD-10-CM

## 2012-10-31 DIAGNOSIS — J449 Chronic obstructive pulmonary disease, unspecified: Secondary | ICD-10-CM

## 2012-10-31 NOTE — Patient Instructions (Addendum)
Continue on advair/ spiriva Stay on continuous O2 4L at rest & 6l when walking- use continuous flow setting - NO  demand/pulse setting -please Advance activity as tolerated.  follow up Dr. Vassie Loll  In 1 month at [facility]

## 2012-10-31 NOTE — Progress Notes (Signed)
  Subjective:    Patient ID: Tracy [name], male    DOB: 04-18-1926, 79 y.o.   MRN: [id number]  HPI  PCP- [place]   79 y/o WM for FU of OSA; COPD  OBSTRUCTIVE SLEEP APNEA - ~ sleep study 3/06 w/ RDI 26 w/ desat to 76%...  COPD - on home O2 .  ~ PFT 11/04 showed FVC=4.51 (103%), FEV1=1.93 (67%) and DLCO~50%...  ~ CT Chest 9/05 & CT abd 1/12 showed marked emphysema w/ blebs, & thor spondylosis...  ~ Office spirometry 11/05 showed FEV1=1.57 (48%)...  ~ PFT 4/10 showed FVC= 3.00 (77%), FEV1= 1.37 (45%)  11/29/2011 Acute OV >> improved with avelox + prednisone taper    09/26/12 Presented with worsening hypoxia Ct angio showed Subsegmental acute pulmonary thromboembolism in the right lower lobe, 7 mm spiculated lung lesion in the right middle lobe & bullous emphysema  DUplex rt popliteal DVt noted  PFTs -12/13- fev1 52% (1.30 ) -unchanged from last  DLCO 29%  ONO on cpap + 3L O2 shows drop in satn x 2h  breathing has unchanged. cough w/ brown phlem. no wheezing, no chest tx  10/31/2012 Follow up  Patient returns for six-month followup for COPD, and PE Breathing is unchanged. Denies chest pain, chest tightness, SOB or coughing. Reports he is doing well with Coumadin without any known bleeding, episodes. He denies any flare of cough, and shortness, of breath or largely, swelling  Review of Systems Constitutional:   No  weight loss, night sweats,  Fevers, chills, + fatigue, or  lassitude.  HEENT:   No headaches,  Difficulty swallowing,  Tooth/dental problems, or  Sore throat,                No sneezing, itching, ear ache, nasal congestion, post nasal drip,   CV:  No chest pain,  Orthopnea, PND, swelling in lower extremities, anasarca, dizziness, palpitations, syncope.   GI  No heartburn, indigestion, abdominal pain, nausea, vomiting, diarrhea, change in bowel habits, loss of appetite, bloody stools.   Resp:  ,  No coughing up of blood.     No chest wall deformity  Skin: no rash or  lesions.  GU: no dysuria, change in color of urine, no urgency or frequency.  No flank pain, no hematuria   MS:  No joint pain or swelling.  No decreased range of motion.     Psych:  No change in mood or affect. No depression or anxiety.  No memory loss.          Objective:   Physical Exam  GEN: A/Ox3; pleasant , NAD  HEENT:  [place]/AT,  EACs-clear, TMs-wnl, NOSE-clear, THROAT-clear, no lesions, no postnasal drip or exudate noted.   NECK:  Supple w/ fair ROM; no JVD; normal carotid impulses w/o bruits; no thyromegaly or nodules palpated; no lymphadenopathy.  RESP  Diminished BS in bases .no accessory muscle use, no dullness to percussion  CARD:  RRR, no m/r/g  , no peripheral edema, pulses intact, no cyanosis or clubbing.  GI:   Soft & nt; nml bowel sounds; no organomegaly or masses detected.  Musco: Warm bil, no deformities or joint swelling noted.   Neuro: alert, no focal deficits noted.    Skin: Warm, no lesions or rashes         Assessment & Plan:

## 2012-11-03 NOTE — Assessment & Plan Note (Signed)
Continue on advair/ spiriva Stay on continuous O2 4L at rest & 6l when walking- use continuous flow setting - NO  demand/pulse setting -please Advance activity as tolerated.  follow up Dr. Alva  In 1 month at [place] [facility]   

## 2012-11-03 NOTE — Assessment & Plan Note (Signed)
Continue on Coumadin therapy. Continue with INR checks at [facility]

## 2012-11-06 ENCOUNTER — Ambulatory Visit (HOSPITAL_BASED_OUTPATIENT_CLINIC_OR_DEPARTMENT_OTHER): Payer: 59

## 2012-11-06 ENCOUNTER — Other Ambulatory Visit (HOSPITAL_BASED_OUTPATIENT_CLINIC_OR_DEPARTMENT_OTHER): Payer: 59 | Admitting: Lab

## 2012-11-06 VITALS — BP 123/74 | HR 74 | Temp 97.4°F | Resp 20

## 2012-11-06 DIAGNOSIS — D751 Secondary polycythemia: Secondary | ICD-10-CM

## 2012-11-06 LAB — CBC WITH DIFFERENTIAL (CANCER CENTER ONLY)
Eosinophils Absolute: 0.3 10*3/uL (ref 0.0–0.5)
HCT: 49.1 % (ref 38.7–49.9)
LYMPH#: 1.2 10*3/uL (ref 0.9–3.3)
LYMPH%: 12.5 % — ABNORMAL LOW (ref 14.0–48.0)
MCV: 95 fL (ref 82–98)
MONO#: 1.1 10*3/uL — ABNORMAL HIGH (ref 0.1–0.9)
NEUT%: 71.9 % (ref 40.0–80.0)
Platelets: 168 10*3/uL (ref 145–400)
RBC: 5.15 10*6/uL (ref 4.20–5.70)
WBC: 9.2 10*3/uL (ref 4.0–10.0)

## 2012-11-06 NOTE — Patient Instructions (Addendum)

## 2012-11-06 NOTE — Progress Notes (Signed)
Tracy [name] presents today for phlebotomy per MD orders. Phlebotomy procedure started at 1236 and ended at 1250. 500 ml removed. Patient observed for 30 minutes after procedure without any incident. Patient tolerated procedure well. IV needle removed intact.

## 2012-11-08 ENCOUNTER — Ambulatory Visit (INDEPENDENT_AMBULATORY_CARE_PROVIDER_SITE_OTHER): Payer: 59 | Admitting: General Practice

## 2012-11-08 DIAGNOSIS — I2699 Other pulmonary embolism without acute cor pulmonale: Secondary | ICD-10-CM

## 2012-11-08 LAB — POCT INR: INR: 1.4

## 2012-11-13 ENCOUNTER — Ambulatory Visit (HOSPITAL_BASED_OUTPATIENT_CLINIC_OR_DEPARTMENT_OTHER): Payer: 59

## 2012-11-13 ENCOUNTER — Other Ambulatory Visit (HOSPITAL_BASED_OUTPATIENT_CLINIC_OR_DEPARTMENT_OTHER): Payer: 59 | Admitting: Lab

## 2012-11-13 VITALS — BP 111/70 | HR 69 | Temp 97.0°F | Resp 20

## 2012-11-13 DIAGNOSIS — D751 Secondary polycythemia: Secondary | ICD-10-CM

## 2012-11-13 LAB — CBC WITH DIFFERENTIAL (CANCER CENTER ONLY)
BASO#: 0 10*3/uL (ref 0.0–0.2)
EOS%: 3.2 % (ref 0.0–7.0)
Eosinophils Absolute: 0.3 10*3/uL (ref 0.0–0.5)
HCT: 47.1 % (ref 38.7–49.9)
HGB: 15.6 g/dL (ref 13.0–17.1)
LYMPH#: 1.1 10*3/uL (ref 0.9–3.3)
MCHC: 33.1 g/dL (ref 32.0–35.9)
NEUT%: 71.5 % (ref 40.0–80.0)
RBC: 4.98 10*6/uL (ref 4.20–5.70)

## 2012-11-13 NOTE — Progress Notes (Signed)
Rachael Fee presents today for phlebotomy per MD orders. Phlebotomy procedure started at 1232 and ended at 1243. Approximately 500 mls removed. Cannulated x 2, 1/2 bag removed each time.   Patient observed for 30 minutes after procedure without any incident. Patient tolerated procedure well. IV needle removed intact.

## 2012-11-13 NOTE — Patient Instructions (Addendum)

## 2012-11-17 ENCOUNTER — Ambulatory Visit (INDEPENDENT_AMBULATORY_CARE_PROVIDER_SITE_OTHER): Payer: 59 | Admitting: General Practice

## 2012-11-17 DIAGNOSIS — I2699 Other pulmonary embolism without acute cor pulmonale: Secondary | ICD-10-CM

## 2012-11-20 ENCOUNTER — Ambulatory Visit (HOSPITAL_BASED_OUTPATIENT_CLINIC_OR_DEPARTMENT_OTHER): Payer: 59

## 2012-11-20 ENCOUNTER — Ambulatory Visit (HOSPITAL_BASED_OUTPATIENT_CLINIC_OR_DEPARTMENT_OTHER): Payer: 59 | Admitting: Medical

## 2012-11-20 ENCOUNTER — Other Ambulatory Visit (HOSPITAL_BASED_OUTPATIENT_CLINIC_OR_DEPARTMENT_OTHER): Payer: 59 | Admitting: Lab

## 2012-11-20 VITALS — BP 119/76 | HR 76

## 2012-11-20 VITALS — BP 123/66 | HR 94 | Temp 98.0°F | Resp 18 | Ht 70.0 in | Wt 201.0 lb

## 2012-11-20 DIAGNOSIS — D751 Secondary polycythemia: Secondary | ICD-10-CM

## 2012-11-20 DIAGNOSIS — I2699 Other pulmonary embolism without acute cor pulmonale: Secondary | ICD-10-CM

## 2012-11-20 DIAGNOSIS — I82409 Acute embolism and thrombosis of unspecified deep veins of unspecified lower extremity: Secondary | ICD-10-CM

## 2012-11-20 LAB — CBC WITH DIFFERENTIAL (CANCER CENTER ONLY)
BASO#: 0 10*3/uL (ref 0.0–0.2)
BASO%: 0.2 % (ref 0.0–2.0)
HCT: 45.3 % (ref 38.7–49.9)
HGB: 14.9 g/dL (ref 13.0–17.1)
LYMPH#: 1.2 10*3/uL (ref 0.9–3.3)
MONO#: 1 10*3/uL — ABNORMAL HIGH (ref 0.1–0.9)
NEUT#: 5.9 10*3/uL (ref 1.5–6.5)
NEUT%: 69.8 % (ref 40.0–80.0)
RDW: 14.6 % (ref 11.1–15.7)
WBC: 8.4 10*3/uL (ref 4.0–10.0)

## 2012-11-20 LAB — IRON AND TIBC
%SAT: 34 % (ref 20–55)
TIBC: 306 ug/dL (ref 215–435)
UIBC: 201 ug/dL (ref 125–400)

## 2012-11-20 NOTE — Patient Instructions (Addendum)

## 2012-11-20 NOTE — Progress Notes (Signed)
Diagnoses: #1.  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 #2.  Pulmonary embolism/deep venous thrombosis.  Current therapy: #1.  Currently, weekly phlebotomy.  He is status post 3 phlebotomies. #2.  Coumadin 5 mg by mouth daily.  Interim history: Tracy [name] presents today for an office followup visit.  Overall, he, reports, that he is doing quite well.  He does have to wear continuous oxygen for his COPD. Marland Kitchen  He remains on Coumadin for his DVT/pulmonary embolism.  [name] for the first time, his hemoglobin was close to 20, and hematocrit 57.  We have been phlebotomizing  him on a weekly basis.  His hemoglobin today is 14.9, and hematocrit is 45.3.  The phlebotomies have significantly improved hs blood counts.  We did send off a JAK 2 on him which was negative.  His ferritin was 274, and a refill or peau eating level was 16.8. I think we can go ahead with one more phlebotomy today.  This will make his fourth phlebotomy.  He, otherwise does not report any new complaints.  He has a good appetite.  He denies any nausea, vomiting, diarrhea, constipation.  He denies any fevers, chills, or night sweats.  He denies any cough, chest pain, shortness of breath, other than his COPD. Marland Kitchen  He denies any obvious, bleeding.  He denies any headaches, visual changes, or rashes.  Review of Systems: Constitutional:Negative for malaise/fatigue, fever, chills, weight loss, diaphoresis, activity change, appetite change, and unexpected weight change.  HEENT: Negative for double vision, blurred vision, visual loss, ear pain, tinnitus, congestion, rhinorrhea, epistaxis sore throat or sinus disease, oral pain/lesion, tongue soreness Respiratory: Negative for cough, chest tightness, shortness of breath, wheezing and stridor.  Cardiovascular: Negative for chest pain, palpitations, leg swelling, orthopnea, PND, DOE or claudication Gastrointestinal: Negative for nausea, vomiting, abdominal pain, diarrhea,  constipation, blood in stool, melena, hematochezia, abdominal distention, anal bleeding, rectal pain, anorexia and hematemesis.  Genitourinary: Negative for dysuria, frequency, hematuria,  Musculoskeletal: Negative for myalgias, back pain, joint swelling, arthralgias and gait problem.  Skin: Negative for rash, color change, pallor and wound.  Neurological:. Negative for dizziness/light-headedness, tremors, seizures, syncope, facial asymmetry, speech difficulty, weakness, numbness, headaches and paresthesias.  Hematological: Negative for adenopathy. Does not bruise/bleed easily.  Psychiatric/Behavioral:  Negative for depression, no loss of interest in normal activity or change in sleep pattern.   Physical Exam: This is a pleasant, 79 year old, well-developed, well-nourished, white gentleman, in no obvious distress Vitals: Temperature 98.0 degrees, pulse 65, respirations 18, blood pressure 1.3%, C6, weight 201 pounds HEENT reveals a normocephalic, atraumatic skull, no scleral icterus, no oral lesions  Neck is supple without any cervical or supraclavicular adenopathy.  Lungs are clear to auscultation bilaterally. There are no wheezes, rales or rhonci Cardiac is regular rate and rhythm with a normal S1 and S2. There are no murmurs, rubs, or bruits.  Abdomen is soft with good bowel sounds, there is no palpable mass. There is no palpable hepatosplenomegaly. There is no palpable fluid wave.  Musculoskeletal no tenderness of the spine, ribs, or hips.  Extremities there are no clubbing, cyanosis, or edema.  Skin no petechia, purpura or ecchymosis Neurologic is nonfocal.  Laboratory Data: White count 8.4, hemoglobin 14.9, hematocrit 45.3, platelets 174,000  Current Outpatient Prescriptions on File Prior to Visit  Medication Sig Dispense Refill  . acetaminophen (TYLENOL ARTHRITIS PAIN) 650 MG CR tablet Take 650 mg by mouth as needed.       . fish oil-omega-3 fatty acids 1000  MG capsule Take 1 g by  mouth daily.       . Fluticasone-Salmeterol (ADVAIR DISKUS) 250-50 MCG/DOSE AEPB Inhale 1 puff into the lungs every 12 (twelve) hours.  60 each  11  . guaiFENesin (MUCINEX) 600 MG 12 hr tablet Take 1,200 mg by mouth 2 (two) times daily.       Marland Kitchen losartan (COZAAR) 50 MG tablet Take 1 tablet (50 mg total) by mouth daily.  30 tablet  6  . simvastatin (ZOCOR) 40 MG tablet Take 1 tablet (40 mg total) by mouth at bedtime.  30 tablet  11  . tiotropium (SPIRIVA) 18 MCG inhalation capsule Place 1 capsule (18 mcg total) into inhaler and inhale daily.  30 capsule  11  . warfarin (COUMADIN) 5 MG tablet Take 1 tablet (5 mg total) by mouth one time only at 6 PM.  5 tablet  1   No current facility-administered medications on file prior to visit.   Assessment/Plan: This is a pleasant, 79 year old, white gentleman, with the following issues:  #1.  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  We will go ahead and phlebotomize him again today.  His hemoglobin/hematocrit has come down quite nicely since [name].   #2.  DVT/pulmonary embolism.  He is on Coumadin.  He is probably going to need one year of anticoagulation.  #3.  Followup.  We'll follow back up with Tracy [name].  In one month, but before then should there be questions or concerns.

## 2012-11-20 NOTE — Progress Notes (Signed)
Tracy [name] presents today for phlebotomy per MD orders. Phlebotomy procedure started at 1200 and ended at 1220. 500 grams removed. Patient observed for 30 minutes after procedure without any incident. Patient tolerated procedure well. IV needle removed intact.

## 2012-11-20 NOTE — Progress Notes (Signed)
Tracy [name] presents today for phlebotomy per MD orders. Phlebotomy procedure started at 1215 and ended at 1222. 500 ml removed. Patient observed for 30 minutes after procedure without any incident. Patient tolerated procedure well. IV needle removed intact.

## 2012-11-21 LAB — POCT INR: INR: 2.1

## 2012-11-28 ENCOUNTER — Ambulatory Visit: Payer: 59 | Admitting: Pulmonary Disease

## 2012-11-30 ENCOUNTER — Ambulatory Visit (INDEPENDENT_AMBULATORY_CARE_PROVIDER_SITE_OTHER): Payer: 59 | Admitting: General Practice

## 2012-11-30 ENCOUNTER — Telehealth: Payer: Self-pay | Admitting: Internal Medicine

## 2012-11-30 ENCOUNTER — Telehealth: Payer: Self-pay | Admitting: Pulmonary Disease

## 2012-11-30 DIAGNOSIS — I2699 Other pulmonary embolism without acute cor pulmonale: Secondary | ICD-10-CM

## 2012-11-30 NOTE — Telephone Encounter (Signed)
Decided to speak w/ the coumadin clinic directly.  Tracy [name]

## 2012-11-30 NOTE — Telephone Encounter (Signed)
done

## 2012-12-05 ENCOUNTER — Ambulatory Visit (INDEPENDENT_AMBULATORY_CARE_PROVIDER_SITE_OTHER): Payer: 59 | Admitting: Pulmonary Disease

## 2012-12-05 ENCOUNTER — Encounter: Payer: Self-pay | Admitting: Pulmonary Disease

## 2012-12-05 VITALS — BP 122/80 | HR 66 | Temp 98.1°F | Ht 70.0 in | Wt 202.0 lb

## 2012-12-05 DIAGNOSIS — I2699 Other pulmonary embolism without acute cor pulmonale: Secondary | ICD-10-CM

## 2012-12-05 DIAGNOSIS — J449 Chronic obstructive pulmonary disease, unspecified: Secondary | ICD-10-CM

## 2012-12-05 DIAGNOSIS — R911 Solitary pulmonary nodule: Secondary | ICD-10-CM

## 2012-12-05 NOTE — Progress Notes (Signed)
  Subjective:    Patient ID: Tracy [name], male    DOB: 21-Nov-1925, 79 y.o.   MRN: [id number]  HPI  PCP- [place]   79 y/o WM for FU of OSA; COPD & new pulmonary nodule noted dec'13 OBSTRUCTIVE SLEEP APNEA - ~ sleep study 3/06 w/ RDI 26 w/ desat to 76%...  COPD - on home O2 .  ~ PFT 11/04 showed FVC=4.51 (103%), FEV1=1.93 (67%) and DLCO~50%...  ~ CT Chest 9/05 & CT abd 1/12 showed marked emphysema w/ blebs, & thor spondylosis...  ~ Office spirometry 11/05 showed FEV1=1.57 (48%)...  ~ PFT 4/10 showed FVC= 3.00 (77%), FEV1= 1.37 (45%)  11/29/2011 Acute OV >> improved with avelox + prednisone taper  09/26/12  Presented with worsening hypoxia  Ct angio showed Subsegmental acute pulmonary thromboembolism in the right lower lobe, 7 mm spiculated lung lesion in the right middle lobe & bullous emphysema  DUplex rt popliteal DVt noted  PFTs -12/13- fev1 52% (1.30 ) -unchanged from last  DLCO 29%  ONO on cpap + 3L O2 shows drop in satn x 2h   12/05/2012  Breathing is unchanged. Has been adjusting his O2 when he needs to.  Denies chest pain, chest tightness, SOB or coughing. plavix was stopped when coumadin started, INR in range although last was 1.9 - being managed by [facility] 91% on 4L [place] - is able to tolerate pulse o2 at rest but continues to desaturate on 4L continuous on wlaking   Review of Systems neg for any significant sore throat, dysphagia, itching, sneezing, nasal congestion or excess/ purulent secretions, fever, chills, sweats, unintended wt loss, pleuritic or exertional cp, hempoptysis, orthopnea pnd or change in chronic leg swelling. Also denies presyncope, palpitations, heartburn, abdominal pain, nausea, vomiting, diarrhea or change in bowel or urinary habits, dysuria,hematuria, rash, arthralgias, visual complaints, headache, numbness weakness or ataxia.     Objective:   Physical Exam   Gen. Pleasant, well-nourished, in no distress ENT - no lesions, no post nasal  drip Neck: No JVD, no thyromegaly, no carotid bruits Lungs: no use of accessory muscles, no dullness to percussion, clear without rales or rhonchi  Cardiovascular: Rhythm regular, heart sounds  normal, no murmurs or gallops, no peripheral edema Musculoskeletal: No deformities, no cyanosis or clubbing         Assessment & Plan:

## 2012-12-05 NOTE — Patient Instructions (Addendum)
Coumadin until June Ok to use oxygen on demand while at rest Check on 4L continuous when walking

## 2012-12-08 ENCOUNTER — Telehealth: Payer: Self-pay | Admitting: Pulmonary Disease

## 2012-12-08 DIAGNOSIS — R911 Solitary pulmonary nodule: Secondary | ICD-10-CM | POA: Insufficient documentation

## 2012-12-08 NOTE — Assessment & Plan Note (Signed)
FU CT in 6/13 - doubt he is a candidate for surgery or even SBRT

## 2012-12-08 NOTE — Telephone Encounter (Signed)
Small nodule was noted on his CT scan in December He needs FU CT - no contrast in June '13 - pl order if pt willing

## 2012-12-08 NOTE — Assessment & Plan Note (Signed)
Coumadin until 6/13

## 2012-12-08 NOTE — Assessment & Plan Note (Signed)
Ct meds OK to use pulse O2 at erst & 6L continuous when walking

## 2012-12-14 LAB — POCT INR: INR: 2.1

## 2012-12-14 NOTE — Telephone Encounter (Signed)
I spoke with patient about results and he verbalized understanding and had no questions Order has been placed.  

## 2012-12-15 ENCOUNTER — Ambulatory Visit (INDEPENDENT_AMBULATORY_CARE_PROVIDER_SITE_OTHER): Payer: 59 | Admitting: General Practice

## 2012-12-15 DIAGNOSIS — I2699 Other pulmonary embolism without acute cor pulmonale: Secondary | ICD-10-CM

## 2012-12-18 ENCOUNTER — Ambulatory Visit (HOSPITAL_BASED_OUTPATIENT_CLINIC_OR_DEPARTMENT_OTHER): Payer: 59

## 2012-12-18 ENCOUNTER — Ambulatory Visit (HOSPITAL_BASED_OUTPATIENT_CLINIC_OR_DEPARTMENT_OTHER): Payer: 59 | Admitting: Medical

## 2012-12-18 ENCOUNTER — Other Ambulatory Visit (HOSPITAL_BASED_OUTPATIENT_CLINIC_OR_DEPARTMENT_OTHER): Payer: 59 | Admitting: Lab

## 2012-12-18 VITALS — BP 127/65 | HR 59 | Temp 97.7°F | Resp 20 | Ht 70.0 in | Wt 200.0 lb

## 2012-12-18 VITALS — BP 117/79 | HR 78

## 2012-12-18 DIAGNOSIS — D751 Secondary polycythemia: Secondary | ICD-10-CM

## 2012-12-18 DIAGNOSIS — D45 Polycythemia vera: Secondary | ICD-10-CM

## 2012-12-18 DIAGNOSIS — I2699 Other pulmonary embolism without acute cor pulmonale: Secondary | ICD-10-CM

## 2012-12-18 DIAGNOSIS — I82409 Acute embolism and thrombosis of unspecified deep veins of unspecified lower extremity: Secondary | ICD-10-CM

## 2012-12-18 LAB — CBC WITH DIFFERENTIAL (CANCER CENTER ONLY)
BASO%: 0.3 % (ref 0.0–2.0)
EOS%: 4 % (ref 0.0–7.0)
HCT: 46.2 % (ref 38.7–49.9)
LYMPH%: 13.9 % — ABNORMAL LOW (ref 14.0–48.0)
MCHC: 32.5 g/dL (ref 32.0–35.9)
MCV: 98 fL (ref 82–98)
NEUT%: 70.5 % (ref 40.0–80.0)
Platelets: 207 10*3/uL (ref 145–400)
RDW: 15.4 % (ref 11.1–15.7)

## 2012-12-18 NOTE — Progress Notes (Signed)
Diagnoses: #1.  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 #2.  Pulmonary embolism/deep venous thrombosis.  Current therapy: #1.  Currently, weekly phlebotomy.  He is status post 4 phlebotomies. #2.  Coumadin 5 mg by mouth daily.  Interim history: Tracy [name] presents today for an office followup visit.  Overall, he, reports, that he is doing quite well.  He does have to wear continuous oxygen for his COPD. Marland Kitchen  He remains on Coumadin for his DVT/pulmonary embolism.  [name] for the first time, his hemoglobin was close to 20, and hematocrit 57.  We have been phlebotomizing  him on a weekly basis.  The phlebotomies have significantly improved hs blood counts.  We did send off a JAK 2 on him which was negative.  His recent ferritin back on February 24th was 304, and his iron was 105, with 34% saturation  He, otherwise does not report any new complaints.  He has a good appetite.  He denies any nausea, vomiting, diarrhea, constipation.  He denies any fevers, chills, or night sweats.  He denies any cough, chest pain, shortness of breath, other than his COPD. Marland Kitchen  He denies any obvious, bleeding.  He denies any headaches, visual changes, or rashes.  Of note, his hematocrit is 46.2.  Today, as, such, we will go ahead and phlebotomize him.  Review of Systems: Constitutional:Negative for malaise/fatigue, fever, chills, weight loss, diaphoresis, activity change, appetite change, and unexpected weight change.  HEENT: Negative for double vision, blurred vision, visual loss, ear pain, tinnitus, congestion, rhinorrhea, epistaxis sore throat or sinus disease, oral pain/lesion, tongue soreness Respiratory: Negative for cough, chest tightness, shortness of breath, wheezing and stridor.  Cardiovascular: Negative for chest pain, palpitations, leg swelling, orthopnea, PND, DOE or claudication Gastrointestinal: Negative for nausea, vomiting, abdominal pain, diarrhea, constipation, blood in stool, melena,  hematochezia, abdominal distention, anal bleeding, rectal pain, anorexia and hematemesis.  Genitourinary: Negative for dysuria, frequency, hematuria,  Musculoskeletal: Negative for myalgias, back pain, joint swelling, arthralgias and gait problem.  Skin: Negative for rash, color change, pallor and wound.  Neurological:. Negative for dizziness/light-headedness, tremors, seizures, syncope, facial asymmetry, speech difficulty, weakness, numbness, headaches and paresthesias.  Hematological: Negative for adenopathy. Does not bruise/bleed easily.  Psychiatric/Behavioral:  Negative for depression, no loss of interest in normal activity or change in sleep pattern.   Physical Exam: This is a pleasant, 79 year old, well-developed, well-nourished, white gentleman, in no obvious distress Vitals:  temperature 97.2 degrees, pulse 59, respirations 20, blood pressure 127/65.  Weight 200 pounds  HEENT reveals a normocephalic, atraumatic skull, no scleral icterus, no oral lesions  Neck is supple without any cervical or supraclavicular adenopathy.  Lungs are clear to auscultation bilaterally. There are no wheezes, rales or rhonci Cardiac is regular rate and rhythm with a normal S1 and S2. There are no murmurs, rubs, or bruits.  Abdomen is soft with good bowel sounds, there is no palpable mass. There is no palpable hepatosplenomegaly. There is no palpable fluid wave.  Musculoskeletal no tenderness of the spine, ribs, or hips.  Extremities there are no clubbing, cyanosis, or edema.  Skin no petechia, purpura or ecchymosis Neurologic is nonfocal.  Laboratory Data: White count 8.7, hemoglobin 15.0, hematocrit 46.2, platelets 207,000  Current Outpatient Prescriptions on File Prior to Visit  Medication Sig Dispense Refill  . acetaminophen (TYLENOL ARTHRITIS PAIN) 650 MG CR tablet Take 650 mg by mouth as needed.       . fish oil-omega-3 fatty acids 1000 MG capsule Take 1  g by mouth daily.       .  Fluticasone-Salmeterol (ADVAIR DISKUS) 250-50 MCG/DOSE AEPB Inhale 1 puff into the lungs every 12 (twelve) hours.  60 each  11  . guaiFENesin (MUCINEX) 600 MG 12 hr tablet Take 1,200 mg by mouth 2 (two) times daily.       Marland Kitchen losartan (COZAAR) 50 MG tablet Take 1 tablet (50 mg total) by mouth daily.  30 tablet  6  . simvastatin (ZOCOR) 40 MG tablet Take 1 tablet (40 mg total) by mouth at bedtime.  30 tablet  11  . tiotropium (SPIRIVA) 18 MCG inhalation capsule Place 1 capsule (18 mcg total) into inhaler and inhale daily.  30 capsule  11  . warfarin (COUMADIN) 5 MG tablet Take 1 tablet (5 mg total) by mouth one time only at 6 PM.  5 tablet  1   No current facility-administered medications on file prior to visit.   Assessment/Plan: This is a pleasant, 79 year old, white gentleman, with the following issues:  #1.  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  We will go ahead and phlebotomize him again today.  His hematocrit is still above 45.   #2.  DVT/pulmonary embolism.  He is on Coumadin.  He is probably going to need one year of anticoagulation.  #3.  Followup.  We'll follow back up with Tracy [name] in one month, but before then should there be questions or concerns.

## 2012-12-18 NOTE — Patient Instructions (Addendum)

## 2012-12-18 NOTE — Progress Notes (Signed)
Tracy [name] presents today for phlebotomy per MD orders. Phlebotomy procedure started at 1058 and ended at 1100. removed. Patient observed for 30 minutes after procedure without any incident. Patient tolerated procedure well. IV needle removed intact.

## 2013-01-03 ENCOUNTER — Ambulatory Visit (INDEPENDENT_AMBULATORY_CARE_PROVIDER_SITE_OTHER): Payer: 59 | Admitting: General Practice

## 2013-01-03 DIAGNOSIS — I2699 Other pulmonary embolism without acute cor pulmonale: Secondary | ICD-10-CM

## 2013-01-08 ENCOUNTER — Ambulatory Visit: Payer: 59 | Admitting: Family Medicine

## 2013-01-16 ENCOUNTER — Other Ambulatory Visit: Payer: Self-pay | Admitting: Medical

## 2013-01-16 ENCOUNTER — Ambulatory Visit (INDEPENDENT_AMBULATORY_CARE_PROVIDER_SITE_OTHER): Payer: Medicare Other | Admitting: Family Medicine

## 2013-01-16 ENCOUNTER — Other Ambulatory Visit (HOSPITAL_BASED_OUTPATIENT_CLINIC_OR_DEPARTMENT_OTHER): Payer: Medicare Other | Admitting: Lab

## 2013-01-16 ENCOUNTER — Encounter: Payer: Self-pay | Admitting: Family Medicine

## 2013-01-16 ENCOUNTER — Ambulatory Visit (HOSPITAL_BASED_OUTPATIENT_CLINIC_OR_DEPARTMENT_OTHER): Payer: Medicare Other | Admitting: Medical

## 2013-01-16 ENCOUNTER — Ambulatory Visit (HOSPITAL_BASED_OUTPATIENT_CLINIC_OR_DEPARTMENT_OTHER): Payer: Medicare Other

## 2013-01-16 VITALS — BP 116/75 | HR 61

## 2013-01-16 VITALS — BP 131/70 | HR 70 | Temp 97.6°F | Resp 70 | Ht 70.0 in | Wt 202.0 lb

## 2013-01-16 VITALS — BP 118/68 | HR 64 | Temp 97.8°F | Ht 70.0 in | Wt 202.0 lb

## 2013-01-16 DIAGNOSIS — I82409 Acute embolism and thrombosis of unspecified deep veins of unspecified lower extremity: Secondary | ICD-10-CM

## 2013-01-16 DIAGNOSIS — I2699 Other pulmonary embolism without acute cor pulmonale: Secondary | ICD-10-CM

## 2013-01-16 DIAGNOSIS — D751 Secondary polycythemia: Secondary | ICD-10-CM

## 2013-01-16 DIAGNOSIS — M79609 Pain in unspecified limb: Secondary | ICD-10-CM

## 2013-01-16 DIAGNOSIS — J9601 Acute respiratory failure with hypoxia: Secondary | ICD-10-CM

## 2013-01-16 DIAGNOSIS — M79604 Pain in right leg: Secondary | ICD-10-CM

## 2013-01-16 DIAGNOSIS — J9600 Acute respiratory failure, unspecified whether with hypoxia or hypercapnia: Secondary | ICD-10-CM

## 2013-01-16 DIAGNOSIS — I10 Essential (primary) hypertension: Secondary | ICD-10-CM

## 2013-01-16 DIAGNOSIS — E785 Hyperlipidemia, unspecified: Secondary | ICD-10-CM

## 2013-01-16 LAB — IRON AND TIBC: %SAT: 24 % (ref 20–55)

## 2013-01-16 LAB — FERRITIN: Ferritin: 52 ng/mL (ref 22–322)

## 2013-01-16 LAB — CBC WITH DIFFERENTIAL (CANCER CENTER ONLY)
BASO%: 0.4 % (ref 0.0–2.0)
Eosinophils Absolute: 0.3 10*3/uL (ref 0.0–0.5)
HCT: 46.8 % (ref 38.7–49.9)
LYMPH%: 13.8 % — ABNORMAL LOW (ref 14.0–48.0)
MCH: 31.2 pg (ref 28.0–33.4)
MCV: 99 fL — ABNORMAL HIGH (ref 82–98)
MONO%: 11.7 % (ref 0.0–13.0)
NEUT%: 69.8 % (ref 40.0–80.0)
Platelets: 209 10*3/uL (ref 145–400)
RDW: 14.1 % (ref 11.1–15.7)

## 2013-01-16 MED ORDER — FUROSEMIDE 20 MG PO TABS: 20.0000 mg | ORAL_TABLET | Freq: Every day | ORAL | Status: DC

## 2013-01-16 NOTE — Patient Instructions (Signed)

## 2013-01-16 NOTE — Progress Notes (Signed)
Diagnoses: #1.  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 #2.  Pulmonary embolism/deep venous thrombosis.  Current therapy: #1.  Phlebotomy to maintain hematocrit below 45%. #2.  Coumadin 5 mg by mouth daily.  Interim history: Tracy [name] presents today for an office followup visit.  Overall, he, reports, that he is doing quite well.  He does report, that he just saw all Tracy [name] this morning for right lower leg swelling.  He states that he is going to have an ultrasound of the right lower extremity tomorrow morning to rule out a DVT. He does have to wear continuous oxygen for his COPD. Marland Kitchen  He remains on Coumadin for his DVT/pulmonary embolism.  [name] for the first time, his hemoglobin was close to 20, and hematocrit 57.  We did phlebotomize  him on a weekly basis.  The phlebotomies have significantly improved hs blood counts.  We did send off a JAK 2 on him which was negative.  His recent ferritin back in March was 99, and his iron was 67, with 21% saturation  He, otherwise does not report any new complaints.  He has a good appetite.  He denies any nausea, vomiting, diarrhea, constipation.  He denies any fevers, chills, or night sweats.  He denies any cough, chest pain, shortness of breath, other than his COPD. Marland Kitchen  He denies any obvious, bleeding.  He denies any headaches, visual changes, or rashes.  Of note, his hematocrit is 46.8 today, as, such, we will go ahead and phlebotomize him.  Review of Systems: Constitutional:Negative for malaise/fatigue, fever, chills, weight loss, diaphoresis, activity change, appetite change, and unexpected weight change.  HEENT: Negative for double vision, blurred vision, visual loss, ear pain, tinnitus, congestion, rhinorrhea, epistaxis sore throat or sinus disease, oral pain/lesion, tongue soreness Respiratory: Negative for cough, chest tightness, shortness of breath, wheezing and stridor.  Cardiovascular: Negative for chest pain,  palpitations, leg swelling, orthopnea, PND, DOE or claudication Gastrointestinal: Negative for nausea, vomiting, abdominal pain, diarrhea, constipation, blood in stool, melena, hematochezia, abdominal distention, anal bleeding, rectal pain, anorexia and hematemesis.  Genitourinary: Negative for dysuria, frequency, hematuria,  Musculoskeletal: Negative for myalgias, back pain, joint swelling, arthralgias and gait problem.  Skin: Negative for rash, color change, pallor and wound.  Neurological:. Negative for dizziness/light-headedness, tremors, seizures, syncope, facial asymmetry, speech difficulty, weakness, numbness, headaches and paresthesias.  Hematological: Negative for adenopathy. Does not bruise/bleed easily.  Psychiatric/Behavioral:  Negative for depression, no loss of interest in normal activity or change in sleep pattern.   Physical Exam: This is a pleasant, 79 year old, well-developed, well-nourished, white gentleman, in no obvious distress Vitals:  Temperature 97.6 degrees, pulse 70, respirations 20, blood pressure 131/70, weight 202 pounds HEENT reveals a normocephalic, atraumatic skull, no scleral icterus, no oral lesions  Neck is supple without any cervical or supraclavicular adenopathy.  Lungs are clear to auscultation bilaterally. There are no wheezes, rales or rhonci Cardiac is regular rate and rhythm with a normal S1 and S2. There are no murmurs, rubs, or bruits.  Abdomen is soft with good bowel sounds, there is no palpable mass. There is no palpable hepatosplenomegaly. There is no palpable fluid wave.  Musculoskeletal no tenderness of the spine, ribs, or hips.  Extremities there are no clubbing, cyanosis, or edema.  Skin no petechia, purpura or ecchymosis Neurologic is nonfocal.  Laboratory Data:  White count 7.7, hemoglobin 14.8, hematocrit 46.8, platelets 209,000  Current Outpatient Prescriptions on File Prior to Visit  Medication Sig Dispense Refill  .  acetaminophen  (TYLENOL ARTHRITIS PAIN) 650 MG CR tablet Take 650 mg by mouth as needed.       . fish oil-omega-3 fatty acids 1000 MG capsule Take 1 g by mouth daily.       . Fluticasone-Salmeterol (ADVAIR DISKUS) 250-50 MCG/DOSE AEPB Inhale 1 puff into the lungs every 12 (twelve) hours.  60 each  11  . guaiFENesin (MUCINEX) 600 MG 12 hr tablet Take 1,200 mg by mouth 2 (two) times daily.       Marland Kitchen losartan (COZAAR) 50 MG tablet Take 1 tablet (50 mg total) by mouth daily.  30 tablet  6  . simvastatin (ZOCOR) 40 MG tablet Take 1 tablet (40 mg total) by mouth at bedtime.  30 tablet  11  . tiotropium (SPIRIVA) 18 MCG inhalation capsule Place 1 capsule (18 mcg total) into inhaler and inhale daily.  30 capsule  11  . warfarin (COUMADIN) 5 MG tablet Take 1 tablet (5 mg total) by mouth one time only at 6 PM.  5 tablet  1   No current facility-administered medications on file prior to visit.   Assessment/Plan: This is a pleasant, 79 year old, white gentleman, with the following issues:  #1.  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  We will go ahead and phlebotomize him again today.  His hematocrit is still above 45%.   #2.  DVT/pulmonary embolism.  He is on Coumadin.  He is probably going to need one year of anticoagulation.  #3.  Followup.  We'll follow back up with Tracy [name] in one month, but before then should there be questions or concerns.

## 2013-01-16 NOTE — Patient Instructions (Addendum)
Elevate legs above heart a couple times a day, wear compression hose daily and take the lasix for next 5 days, call if worse or no improvement  Peripheral Edema You have swelling in your legs (peripheral edema). This swelling is due to excess accumulation of salt and water in your body. Edema may be a sign of heart, kidney or liver disease, or a side effect of a medication. It may also be due to problems in the leg veins. Elevating your legs and using special support stockings may be very helpful, if the cause of the swelling is due to poor venous circulation. Avoid long periods of standing, whatever the cause. Treatment of edema depends on identifying the cause. Chips, pretzels, pickles and other salty foods should be avoided. Restricting salt in your diet is almost always needed. Water pills (diuretics) are often used to remove the excess salt and water from your body via urine. These medicines prevent the kidney from reabsorbing sodium. This increases urine flow. Diuretic treatment may also result in lowering of potassium levels in your body. Potassium supplements may be needed if you have to use diuretics daily. Daily weights can help you keep track of your progress in clearing your edema. You should call your caregiver for follow up care as recommended. SEEK IMMEDIATE MEDICAL CARE IF:   You have increased swelling, pain, redness, or heat in your legs.  You develop shortness of breath, especially when lying down.  You develop chest or abdominal pain, weakness, or fainting.  You have a fever. Document Released: 10/21/2004 Document Revised: 12/06/2011 Document Reviewed: 10/01/2009 [facility] Patient Information 2013 [place], [place].

## 2013-01-16 NOTE — Progress Notes (Signed)
Tracy [name] presents today for phlebotomy per MD orders. Phlebotomy procedure started at 1145 and ended at 1200. 500 ml removed. Patient observed for 30 minutes after procedure without any incident. Patient tolerated procedure well. IV needle removed intact.

## 2013-01-17 ENCOUNTER — Ambulatory Visit (HOSPITAL_BASED_OUTPATIENT_CLINIC_OR_DEPARTMENT_OTHER)
Admission: RE | Admit: 2013-01-17 | Discharge: 2013-01-17 | Disposition: A | Discharge status: Home / Self Care | Payer: Medicare Other | Source: Ambulatory Visit | Attending: Family Medicine | Admitting: Family Medicine

## 2013-01-17 ENCOUNTER — Encounter: Payer: Self-pay | Admitting: Family Medicine

## 2013-01-17 DIAGNOSIS — M79604 Pain in right leg: Secondary | ICD-10-CM

## 2013-01-17 DIAGNOSIS — M79609 Pain in unspecified limb: Secondary | ICD-10-CM | POA: Insufficient documentation

## 2013-01-17 DIAGNOSIS — M7989 Other specified soft tissue disorders: Secondary | ICD-10-CM | POA: Insufficient documentation

## 2013-01-17 DIAGNOSIS — Z86718 Personal history of other venous thrombosis and embolism: Secondary | ICD-10-CM | POA: Insufficient documentation

## 2013-01-17 NOTE — Progress Notes (Signed)
Quick Note:  Patient Informed and voiced understanding ______ 

## 2013-01-18 ENCOUNTER — Other Ambulatory Visit: Payer: Self-pay | Admitting: Pulmonary Disease

## 2013-01-18 ENCOUNTER — Encounter: Payer: Self-pay | Admitting: Family Medicine

## 2013-01-18 DIAGNOSIS — M79604 Pain in right leg: Secondary | ICD-10-CM | POA: Insufficient documentation

## 2013-01-18 NOTE — Assessment & Plan Note (Addendum)
Increased edema and pain above medial malleolus but also in posterior calf, ultrasound negative for DVT recommend compression hose, elevate feet above heart at least BID, DASH diet and Furosemide temporarily. Report if no improvement.

## 2013-01-18 NOTE — Assessment & Plan Note (Signed)
Well controlled today.

## 2013-01-18 NOTE — Assessment & Plan Note (Signed)
Using 4-6 L O2 day and night

## 2013-01-18 NOTE — Progress Notes (Signed)
Patient ID: Tracy [name], male   DOB: Aug 18, 1926, 79 y.o.   MRN: [id number] Tracy [name] [id number] [id number] 01/18/2013      Progress Note-Follow Up  Subjective  Chief Complaint  Chief Complaint  Patient presents with  . Follow-up    3 month    HPI  Patient is an 79 year old Caucasian male who is in today for followup. He uses 46 L of oxygen routinely and feels this is helpful. Is complaining of some increased right leg pain and swelling. Denies any new injury. Does have a history of DVT but is on Coumadin. As the pain most notably about the medial malleolus and also up into the calf. No shortness of breath or chest pain. No palpitations, fevers, recent illness, extended travel or trauma.  Past Medical History  Diagnosis Date  . Chronic airway obstruction, not elsewhere classified   . Unspecified essential hypertension   . Cerebrovascular disease, unspecified   . Other and unspecified hyperlipidemia   . Diaphragmatic hernia without mention of obstruction or gangrene   . Diverticulosis of colon (without mention of hemorrhage)   . Benign neoplasm of colon   . Overweight   . OSA on CPAP   . On home oxygen therapy   . Right leg pain 01/18/2013    Past Surgical History  Procedure Laterality Date  . Hernia repair    . Nephroureterectomy      History reviewed. No pertinent family history.  History   Social History  . Marital Status: Married    Spouse Name: Barbette Or    Number of Children: 3  . Years of Education: N/A   Occupational History  .     Social History Main Topics  . Smoking status: Former Smoker -- 1.50 packs/day    Types: Cigarettes    Quit date: 09/27/1978  . Smokeless tobacco: Never Used  . Alcohol Use: No  . Drug Use: No  . Sexually Active: Not on file   Other Topics Concern  . Not on file   Social History Narrative  . No narrative on file    Current Outpatient Prescriptions on File Prior to Visit  Medication Sig Dispense Refill  .  acetaminophen (TYLENOL ARTHRITIS PAIN) 650 MG CR tablet Take 650 mg by mouth as needed.       . fish oil-omega-3 fatty acids 1000 MG capsule Take 1 g by mouth daily.       . Fluticasone-Salmeterol (ADVAIR DISKUS) 250-50 MCG/DOSE AEPB Inhale 1 puff into the lungs every 12 (twelve) hours.  60 each  11  . guaiFENesin (MUCINEX) 600 MG 12 hr tablet Take 1,200 mg by mouth 2 (two) times daily.       Marland Kitchen losartan (COZAAR) 50 MG tablet Take 1 tablet (50 mg total) by mouth daily.  30 tablet  6  . Psyllium (METAMUCIL PO) Take by mouth every morning.      . simvastatin (ZOCOR) 40 MG tablet Take 1 tablet (40 mg total) by mouth at bedtime.  30 tablet  11  . tiotropium (SPIRIVA) 18 MCG inhalation capsule Place 1 capsule (18 mcg total) into inhaler and inhale daily.  30 capsule  11  . warfarin (COUMADIN) 5 MG tablet Take 1 tablet (5 mg total) by mouth one time only at 6 PM.  5 tablet  1   No current facility-administered medications on file prior to visit.    Allergies  Allergen Reactions  . Sulfamethoxazole     REACTION: "washes" him out per  pt    Review of Systems  Review of Systems  Constitutional: Negative for fever and malaise/fatigue.  HENT: Negative for congestion.   Eyes: Negative for discharge.  Respiratory: Positive for shortness of breath.   Cardiovascular: Positive for leg swelling. Negative for chest pain and palpitations.  Gastrointestinal: Negative for nausea, abdominal pain and diarrhea.  Genitourinary: Negative for dysuria.  Musculoskeletal: Positive for joint pain. Negative for falls.  Skin: Negative for rash.  Neurological: Negative for loss of consciousness and headaches.  Endo/Heme/Allergies: Negative for polydipsia.  Psychiatric/Behavioral: Negative for depression and suicidal ideas. The patient is not nervous/anxious and does not have insomnia.     Objective  BP 118/68  Pulse 64  Temp(Src) 97.8 F (36.6 C) (Oral)  Ht 5\' 10"  (1.778 m)  Wt 202 lb 0.6 oz (91.645 kg)  BMI  28.99 kg/m2  SpO2 92%  Physical Exam  Physical Exam  Constitutional: He is oriented to person, place, and time and well-developed, well-nourished, and in no distress. No distress.  HENT:  Head: Normocephalic and atraumatic.  Eyes: Conjunctivae are normal.  Neck: Neck supple. No thyromegaly present.  Cardiovascular: Normal rate, regular rhythm and normal heart sounds.   Pulmonary/Chest: Effort normal and breath sounds normal. No respiratory distress.  O2 via nasal canula  Abdominal: He exhibits no distension and no mass. There is no tenderness.  Musculoskeletal: He exhibits edema and tenderness.  2 + edema right leg, tender to touch above right medial malleolus. 1+ edema lef tleg.   Neurological: He is alert and oriented to person, place, and time.  Skin: Skin is warm.  Chronic venous stasis changes of skin with thin,  darkened skin around ankles and lower calves.   Psychiatric: Memory, affect and judgment normal.    Lab Results  Component Value Date   TSH 2.65 01/11/2011   Lab Results  Component Value Date   WBC 7.7 01/16/2013   HGB 14.8 01/16/2013   HCT 46.8 01/16/2013   MCV 99* 01/16/2013   PLT 209 01/16/2013   Lab Results  Component Value Date   CREATININE 1.20 10/16/2012   BUN 20 10/16/2012   NA 139 10/16/2012   K 4.6 10/16/2012   CL 103 10/16/2012   CO2 25 10/16/2012   Lab Results  Component Value Date   ALT 22 10/16/2012   AST 23 10/16/2012   ALKPHOS 65 10/16/2012   BILITOT 0.5 10/16/2012   Lab Results  Component Value Date   CHOL 162 10/04/2012   Lab Results  Component Value Date   HDL 48 10/04/2012   Lab Results  Component Value Date   LDLCALC 89 10/04/2012   Lab Results  Component Value Date   TRIG 124 10/04/2012   Lab Results  Component Value Date   CHOLHDL 3.4 10/04/2012     Assessment & Plan  Right leg pain Increased edema and pain above medial malleolus but also in posterior calf, ultrasound negative for DVT recommend compression hose, elevate feet above  heart at least BID, DASH diet and Furosemide temporarily. Report if no improvement.  HYPERTENSION, BORDERLINE Well controlled today  Acute respiratory failure with hypoxia Using 4-6 L O2 day and night  HYPERLIPIDEMIA Avoid trans fats, continue current meds

## 2013-01-20 NOTE — Assessment & Plan Note (Signed)
Avoid trans fats, continue current meds

## 2013-01-23 ENCOUNTER — Ambulatory Visit (INDEPENDENT_AMBULATORY_CARE_PROVIDER_SITE_OTHER): Payer: Medicare Other | Admitting: Family Medicine

## 2013-01-23 ENCOUNTER — Telehealth: Payer: Self-pay

## 2013-01-23 ENCOUNTER — Encounter: Payer: Self-pay | Admitting: Family Medicine

## 2013-01-23 VITALS — BP 140/88 | HR 90 | Temp 97.9°F | Ht 70.0 in | Wt 204.1 lb

## 2013-01-23 DIAGNOSIS — I739 Peripheral vascular disease, unspecified: Secondary | ICD-10-CM

## 2013-01-23 DIAGNOSIS — R609 Edema, unspecified: Secondary | ICD-10-CM

## 2013-01-23 DIAGNOSIS — M79604 Pain in right leg: Secondary | ICD-10-CM

## 2013-01-23 DIAGNOSIS — Z87891 Personal history of nicotine dependence: Secondary | ICD-10-CM

## 2013-01-23 DIAGNOSIS — I10 Essential (primary) hypertension: Secondary | ICD-10-CM

## 2013-01-23 DIAGNOSIS — M79609 Pain in unspecified limb: Secondary | ICD-10-CM

## 2013-01-23 DIAGNOSIS — E785 Hyperlipidemia, unspecified: Secondary | ICD-10-CM

## 2013-01-23 NOTE — Patient Instructions (Addendum)
Peripheral Vascular Disease °Peripheral Vascular Disease (PVD), also called Peripheral Arterial Disease (PAD), is a circulation problem caused by cholesterol (atherosclerotic plaque) deposits in the arteries. PVD commonly occurs in the lower extremities (legs) but it can occur in other areas of the body, such as your arms. The cholesterol buildup in the arteries reduces blood flow which can cause pain and other serious problems. The presence of PVD can place a person at risk for Coronary Artery Disease (CAD).  °CAUSES  °Causes of PVD can be many. It is usually associated with more than one risk factor such as:  °· High Cholesterol. °· Smoking. °· Diabetes. °· Lack of exercise or inactivity. °· High blood pressure (hypertension). °· Obesity. °· Family history. °SYMPTOMS  °· When the lower extremities are affected, patients with PVD may experience: °· Leg pain with exertion or physical activity. This is called INTERMITTENT CLAUDICATION. This may present as cramping or numbness with physical activity. The location of the pain is associated with the level of blockage. For example, blockage at the abdominal level (distal abdominal aorta) may result in buttock or hip pain. Lower leg arterial blockage may result in calf pain. °· As PVD becomes more severe, pain can develop with less physical activity. °· In people with severe PVD, leg pain may occur at rest. °· Other PVD signs and symptoms: °· Leg numbness or weakness. °· Coldness in the affected leg or foot, especially when compared to the other leg. °· A change in leg color. °· Patients with significant PVD are more prone to ulcers or sores on toes, feet or legs. These may take longer to heal or may reoccur. The ulcers or sores can become infected. °· If signs and symptoms of PVD are ignored, gangrene may occur. This can result in the loss of toes or loss of an entire limb. °· Not all leg pain is related to PVD. Other medical conditions can cause leg pain such  as: °· Blood clots (embolism) or Deep Vein Thrombosis. °· Inflammation of the blood vessels (vasculitis). °· Spinal stenosis. °DIAGNOSIS  °Diagnosis of PVD can involve several different types of tests. These can include: °· Pulse Volume Recording Method (PVR). This test is simple, painless and does not involve the use of X-rays. PVR involves measuring and comparing the blood pressure in the arms and legs. An ABI (Ankle-Brachial Index) is calculated. The normal ratio of blood pressures is 1. As this number becomes smaller, it indicates more severe disease. °· < 0.95  indicates significant narrowing in one or more leg vessels. °· <0.8 there will usually be pain in the foot, leg or buttock with exercise. °· <0.4 will usually have pain in the legs at rest. °· <0.25  usually indicates limb threatening PVD. °· Doppler detection of pulses in the legs. This test is painless and checks to see if you have a pulses in your legs/feet. °· A dye or contrast material (a substance that highlights the blood vessels so they show up on x-ray) may be given to help your caregiver better see the arteries for the following tests. The dye is eliminated from your body by the kidney's. Your caregiver may order blood work to check your kidney function and other laboratory values before the following tests are performed: °· Magnetic Resonance Angiography (MRA). An MRA is a picture study of the blood vessels and arteries. The MRA machine uses a large magnet to produce images of the blood vessels. °· Computed Tomography Angiography (CTA). A CTA is a   specialized x-ray that looks at how the blood flows in your blood vessels. An IV may be inserted into your arm so contrast dye can be injected. °· Angiogram. Is a procedure that uses x-rays to look at your blood vessels. This procedure is minimally invasive, meaning a small incision (cut) is made in your groin. A small tube (catheter) is then inserted into the artery of your groin. The catheter is  guided to the blood vessel or artery your caregiver wants to examine. Contrast dye is injected into the catheter. X-rays are then taken of the blood vessel or artery. After the images are obtained, the catheter is taken out. °TREATMENT  °Treatment of PVD involves many interventions which may include: °· Lifestyle changes: °· Quitting smoking. °· Exercise. °· Following a low fat, low cholesterol diet. °· Control of diabetes. °· Foot care is very important to the PVD patient. Good foot care can help prevent infection. °· Medication: °· Cholesterol-lowering medicine. °· Blood pressure medicine. °· Anti-platelet drugs. °· Certain medicines may reduce symptoms of Intermittent Claudication. °· Interventional/Surgical options: °· Angioplasty. An Angioplasty is a procedure that inflates a balloon in the blocked artery. This opens the blocked artery to improve blood flow. °· Stent Implant. A wire mesh tube (stent) is placed in the artery. The stent expands and stays in place, allowing the artery to remain open. °· Peripheral Bypass Surgery. This is a surgical procedure that reroutes the blood around a blocked artery to help improve blood flow. This type of procedure may be performed if Angioplasty or stent implants are not an option. °SEEK IMMEDIATE MEDICAL CARE IF:  °· You develop pain or numbness in your arms or legs. °· Your arm or leg turns cold, becomes blue in color. °· You develop redness, warmth, swelling and pain in your arms or legs. °MAKE SURE YOU:  °· Understand these instructions. °· Will watch your condition. °· Will get help right away if you are not doing well or get worse. °Document Released: 10/21/2004 Document Revised: 12/06/2011 Document Reviewed: 09/17/2008 °[employer]® Patient Information ©2013 [employer]. ° °

## 2013-01-23 NOTE — Telephone Encounter (Signed)
Pt called stating he would like an appt to see MD. Pt would to see MD again due to leg still hurting and swelling during the day. Pt stated he took the medication (it didn't help) but he shouldn't have taken it because he has had allergies to Sulfa (although this medication didn't do anything). Pts call transferred to [facility] to schedule appt.

## 2013-01-25 ENCOUNTER — Encounter: Payer: Self-pay | Admitting: Family Medicine

## 2013-01-25 DIAGNOSIS — Z87891 Personal history of nicotine dependence: Secondary | ICD-10-CM

## 2013-01-25 HISTORY — DX: Personal history of nicotine dependence: Z87.891

## 2013-01-25 NOTE — Assessment & Plan Note (Signed)
Well controlled, no changes to meds today

## 2013-01-25 NOTE — Progress Notes (Signed)
Patient ID: Tracy [name], male   DOB: 1925-12-25, 79 y.o.   MRN: [id number] Tracy [name] [id number] 1925/10/29 01/25/2013      Progress Note-Follow Up  Subjective  Chief Complaint  Chief Complaint  Patient presents with  . Leg Pain    and leg swelling during the day    HPI  Patient is an 79 year old Caucasian male who is in today for followup on his right lower extremity pain and edema. He was in last week with a sudden onset of this pain. Ultrasound ruled out DVT. His pain is tolerable but persistent pain is worse over the medial malleolus. He denies any trauma or falls. No chest pain, palpitations shortness or breath GI or GU concerns at this time  Past Medical History  Diagnosis Date  . Chronic airway obstruction, not elsewhere classified   . Unspecified essential hypertension   . Cerebrovascular disease, unspecified   . Other and unspecified hyperlipidemia   . Diaphragmatic hernia without mention of obstruction or gangrene   . Diverticulosis of colon (without mention of hemorrhage)   . Benign neoplasm of colon   . Overweight   . OSA on CPAP   . On home oxygen therapy   . Right leg pain 01/18/2013  . H/O tobacco use, presenting hazards to health 01/25/2013    Past Surgical History  Procedure Laterality Date  . Hernia repair    . Nephroureterectomy      History reviewed. No pertinent family history.  History   Social History  . Marital Status: Married    Spouse Name: Barbette Or    Number of Children: 3  . Years of Education: N/A   Occupational History  .     Social History Main Topics  . Smoking status: Former Smoker -- 1.50 packs/day    Types: Cigarettes    Quit date: 09/27/1978  . Smokeless tobacco: Never Used  . Alcohol Use: No  . Drug Use: No  . Sexually Active: Not on file   Other Topics Concern  . Not on file   Social History Narrative  . No narrative on file    Current Outpatient Prescriptions on File Prior to Visit  Medication Sig Dispense  Refill  . acetaminophen (TYLENOL ARTHRITIS PAIN) 650 MG CR tablet Take 650 mg by mouth as needed.       . fish oil-omega-3 fatty acids 1000 MG capsule Take 1 g by mouth daily.       . Fluticasone-Salmeterol (ADVAIR DISKUS) 250-50 MCG/DOSE AEPB Inhale 1 puff into the lungs every 12 (twelve) hours.  60 each  11  . furosemide (LASIX) 20 MG tablet Take 1 tablet (20 mg total) by mouth daily. Daily for 5 days then 1 daily as needed for increasing edema  10 tablet  0  . guaiFENesin (MUCINEX) 600 MG 12 hr tablet Take 1,200 mg by mouth 2 (two) times daily.       Marland Kitchen losartan (COZAAR) 50 MG tablet Take 1 tablet (50 mg total) by mouth daily.  30 tablet  6  . Psyllium (METAMUCIL PO) Take by mouth every morning.      . simvastatin (ZOCOR) 40 MG tablet Take 1 tablet (40 mg total) by mouth at bedtime.  30 tablet  11  . tiotropium (SPIRIVA) 18 MCG inhalation capsule Place 1 capsule (18 mcg total) into inhaler and inhale daily.  30 capsule  11  . warfarin (COUMADIN) 5 MG tablet Take 1 tablet (5 mg total) by mouth one time only  at 6 PM.  5 tablet  1   No current facility-administered medications on file prior to visit.    Allergies  Allergen Reactions  . Sulfamethoxazole     REACTION: "washes" him out per pt    Review of Systems  Review of Systems  Constitutional: Negative for fever and malaise/fatigue.  HENT: Negative for congestion.   Eyes: Negative for discharge.  Respiratory: Negative for shortness of breath.   Cardiovascular: Positive for leg swelling. Negative for chest pain and palpitations.  Gastrointestinal: Negative for nausea, abdominal pain and diarrhea.  Genitourinary: Negative for dysuria.  Musculoskeletal: Positive for joint pain. Negative for falls.       Right ankle pain with swelling  Skin: Negative for rash.  Neurological: Negative for loss of consciousness and headaches.  Endo/Heme/Allergies: Negative for polydipsia.  Psychiatric/Behavioral: Negative for depression and suicidal  ideas. The patient is not nervous/anxious and does not have insomnia.     Objective  BP 140/88  Pulse 90  Temp(Src) 97.9 F (36.6 C) (Oral)  Ht 5\' 10"  (1.778 m)  Wt 204 lb 1.3 oz (92.57 kg)  BMI 29.28 kg/m2  SpO2 93%  Physical Exam  Physical Exam  Constitutional: He is oriented to person, place, and time and well-developed, well-nourished, and in no distress. No distress.  HENT:  Head: Normocephalic and atraumatic.  Eyes: Conjunctivae are normal.  Neck: Neck supple. No thyromegaly present.  Cardiovascular: Normal rate, regular rhythm and normal heart sounds.   No murmur heard. Pulmonary/Chest: Effort normal and breath sounds normal. No respiratory distress.  Abdominal: He exhibits no distension and no mass. There is no tenderness.  Musculoskeletal: He exhibits edema and tenderness.  Right lower extremity swelling and tenderness above medial malleolus  Neurological: He is alert and oriented to person, place, and time.  Skin: Skin is warm.  Psychiatric: Memory, affect and judgment normal.    Lab Results  Component Value Date   TSH 2.65 01/11/2011   Lab Results  Component Value Date   WBC 7.7 01/16/2013   HGB 14.8 01/16/2013   HCT 46.8 01/16/2013   MCV 99* 01/16/2013   PLT 209 01/16/2013   Lab Results  Component Value Date   CREATININE 1.20 10/16/2012   BUN 20 10/16/2012   NA 139 10/16/2012   K 4.6 10/16/2012   CL 103 10/16/2012   CO2 25 10/16/2012   Lab Results  Component Value Date   ALT 22 10/16/2012   AST 23 10/16/2012   ALKPHOS 65 10/16/2012   BILITOT 0.5 10/16/2012   Lab Results  Component Value Date   CHOL 162 10/04/2012   Lab Results  Component Value Date   HDL 48 10/04/2012   Lab Results  Component Value Date   LDLCALC 89 10/04/2012   Lab Results  Component Value Date   TRIG 124 10/04/2012   Lab Results  Component Value Date   CHOLHDL 3.4 10/04/2012     Assessment & Plan  HYPERTENSION, BORDERLINE Well controlled, no changes to meds today  Right leg  pain Ruled out DVT last week but pain and swelling persist in right lower extremity. Unresponsive to Lasix, encouraged him to continue minimizing sodium and elevating foot. Will refer to vascular surgery to evaluate for PAD and blockages  HYPERLIPIDEMIA Tolerating Simvastatin, continue

## 2013-01-25 NOTE — Assessment & Plan Note (Signed)
Ruled out DVT last week but pain and swelling persist in right lower extremity. Unresponsive to Lasix, encouraged him to continue minimizing sodium and elevating foot. Will refer to vascular surgery to evaluate for PAD and blockages

## 2013-01-25 NOTE — Assessment & Plan Note (Signed)
Tolerating Simvastatin, continue

## 2013-01-29 ENCOUNTER — Other Ambulatory Visit: Payer: Self-pay | Admitting: *Deleted

## 2013-01-29 DIAGNOSIS — M79609 Pain in unspecified limb: Secondary | ICD-10-CM

## 2013-01-29 DIAGNOSIS — Z86718 Personal history of other venous thrombosis and embolism: Secondary | ICD-10-CM

## 2013-01-31 ENCOUNTER — Ambulatory Visit (INDEPENDENT_AMBULATORY_CARE_PROVIDER_SITE_OTHER): Payer: Medicare Other | Admitting: General Practice

## 2013-01-31 DIAGNOSIS — I2699 Other pulmonary embolism without acute cor pulmonale: Secondary | ICD-10-CM

## 2013-01-31 LAB — POCT INR: INR: 2.2

## 2013-02-13 ENCOUNTER — Telehealth: Payer: Self-pay | Admitting: Hematology & Oncology

## 2013-02-13 NOTE — Telephone Encounter (Signed)
Left message moved 5-22 to 5-29 °

## 2013-02-15 ENCOUNTER — Other Ambulatory Visit: Payer: Medicare Other | Admitting: Lab

## 2013-02-15 ENCOUNTER — Ambulatory Visit: Payer: Medicare Other | Admitting: Medical

## 2013-02-19 ENCOUNTER — Other Ambulatory Visit: Payer: Self-pay | Admitting: Pulmonary Disease

## 2013-02-20 ENCOUNTER — Encounter: Payer: Self-pay | Admitting: Pulmonary Disease

## 2013-02-20 ENCOUNTER — Encounter (HOSPITAL_COMMUNITY): Payer: Self-pay

## 2013-02-20 ENCOUNTER — Inpatient Hospital Stay (HOSPITAL_COMMUNITY)
Admission: AD | Admit: 2013-02-20 | Discharge: 2013-02-23 | DRG: 189 | Disposition: A | Discharge status: Nursing Home (Includes ICF) | Payer: Medicare Other | Source: Ambulatory Visit | Attending: Pulmonary Disease | Admitting: Pulmonary Disease

## 2013-02-20 ENCOUNTER — Inpatient Hospital Stay (HOSPITAL_COMMUNITY): Payer: Medicare Other

## 2013-02-20 ENCOUNTER — Ambulatory Visit (INDEPENDENT_AMBULATORY_CARE_PROVIDER_SITE_OTHER): Payer: Medicare Other | Admitting: Pulmonary Disease

## 2013-02-20 VITALS — BP 118/84 | HR 114 | Ht 70.0 in | Wt 207.0 lb

## 2013-02-20 DIAGNOSIS — C679 Malignant neoplasm of bladder, unspecified: Secondary | ICD-10-CM

## 2013-02-20 DIAGNOSIS — Z87891 Personal history of nicotine dependence: Secondary | ICD-10-CM

## 2013-02-20 DIAGNOSIS — Z79899 Other long term (current) drug therapy: Secondary | ICD-10-CM

## 2013-02-20 DIAGNOSIS — M1990 Unspecified osteoarthritis, unspecified site: Secondary | ICD-10-CM

## 2013-02-20 DIAGNOSIS — R531 Weakness: Secondary | ICD-10-CM

## 2013-02-20 DIAGNOSIS — R0902 Hypoxemia: Secondary | ICD-10-CM | POA: Diagnosis present

## 2013-02-20 DIAGNOSIS — E785 Hyperlipidemia, unspecified: Secondary | ICD-10-CM | POA: Diagnosis present

## 2013-02-20 DIAGNOSIS — J9620 Acute and chronic respiratory failure, unspecified whether with hypoxia or hypercapnia: Principal | ICD-10-CM | POA: Diagnosis present

## 2013-02-20 DIAGNOSIS — R911 Solitary pulmonary nodule: Secondary | ICD-10-CM

## 2013-02-20 DIAGNOSIS — M79604 Pain in right leg: Secondary | ICD-10-CM

## 2013-02-20 DIAGNOSIS — J449 Chronic obstructive pulmonary disease, unspecified: Secondary | ICD-10-CM

## 2013-02-20 DIAGNOSIS — J9600 Acute respiratory failure, unspecified whether with hypoxia or hypercapnia: Secondary | ICD-10-CM

## 2013-02-20 DIAGNOSIS — D126 Benign neoplasm of colon, unspecified: Secondary | ICD-10-CM

## 2013-02-20 DIAGNOSIS — I2782 Chronic pulmonary embolism: Secondary | ICD-10-CM | POA: Diagnosis present

## 2013-02-20 DIAGNOSIS — J441 Chronic obstructive pulmonary disease with (acute) exacerbation: Secondary | ICD-10-CM | POA: Diagnosis present

## 2013-02-20 DIAGNOSIS — G4733 Obstructive sleep apnea (adult) (pediatric): Secondary | ICD-10-CM

## 2013-02-20 DIAGNOSIS — Z86718 Personal history of other venous thrombosis and embolism: Secondary | ICD-10-CM

## 2013-02-20 DIAGNOSIS — Z6827 Body mass index (BMI) 27.0-27.9, adult: Secondary | ICD-10-CM

## 2013-02-20 DIAGNOSIS — D751 Secondary polycythemia: Secondary | ICD-10-CM

## 2013-02-20 DIAGNOSIS — I2699 Other pulmonary embolism without acute cor pulmonale: Secondary | ICD-10-CM

## 2013-02-20 DIAGNOSIS — I10 Essential (primary) hypertension: Secondary | ICD-10-CM | POA: Diagnosis present

## 2013-02-20 DIAGNOSIS — J9601 Acute respiratory failure with hypoxia: Secondary | ICD-10-CM

## 2013-02-20 DIAGNOSIS — I2609 Other pulmonary embolism with acute cor pulmonale: Secondary | ICD-10-CM

## 2013-02-20 DIAGNOSIS — E663 Overweight: Secondary | ICD-10-CM

## 2013-02-20 DIAGNOSIS — K449 Diaphragmatic hernia without obstruction or gangrene: Secondary | ICD-10-CM

## 2013-02-20 DIAGNOSIS — K5730 Diverticulosis of large intestine without perforation or abscess without bleeding: Secondary | ICD-10-CM

## 2013-02-20 DIAGNOSIS — I679 Cerebrovascular disease, unspecified: Secondary | ICD-10-CM | POA: Diagnosis present

## 2013-02-20 DIAGNOSIS — Z7901 Long term (current) use of anticoagulants: Secondary | ICD-10-CM

## 2013-02-20 LAB — CBC WITH DIFFERENTIAL/PLATELET
Basophils Absolute: 0 10*3/uL (ref 0.0–0.1)
Basophils Relative: 0 % (ref 0–1)
Lymphocytes Relative: 12 % (ref 12–46)
MCHC: 31.7 g/dL (ref 30.0–36.0)
Neutro Abs: 6.8 10*3/uL (ref 1.7–7.7)
Neutrophils Relative %: 75 % (ref 43–77)
RDW: 14.3 % (ref 11.5–15.5)
WBC: 9.1 10*3/uL (ref 4.0–10.5)

## 2013-02-20 LAB — URINALYSIS, ROUTINE W REFLEX MICROSCOPIC
Glucose, UA: NEGATIVE mg/dL
Hgb urine dipstick: NEGATIVE
Ketones, ur: NEGATIVE mg/dL
Leukocytes, UA: NEGATIVE
Protein, ur: 100 mg/dL — AB
pH: 6.5 (ref 5.0–8.0)

## 2013-02-20 LAB — COMPREHENSIVE METABOLIC PANEL
AST: 21 U/L (ref 0–37)
Albumin: 3.4 g/dL — ABNORMAL LOW (ref 3.5–5.2)
Alkaline Phosphatase: 75 U/L (ref 39–117)
BUN: 25 mg/dL — ABNORMAL HIGH (ref 6–23)
Chloride: 106 mEq/L (ref 96–112)
Creatinine, Ser: 1.08 mg/dL (ref 0.50–1.35)
Potassium: 4.2 mEq/L (ref 3.5–5.1)
Total Bilirubin: 0.3 mg/dL (ref 0.3–1.2)
Total Protein: 7.4 g/dL (ref 6.0–8.3)

## 2013-02-20 LAB — LACTIC ACID, PLASMA: Lactic Acid, Venous: 1.6 mmol/L (ref 0.5–2.2)

## 2013-02-20 LAB — BLOOD GAS, ARTERIAL
Acid-Base Excess: 1 mmol/L (ref 0.0–2.0)
Drawn by: 295031
O2 Content: 6 L/min
pCO2 arterial: 40.4 mmHg (ref 35.0–45.0)
pH, Arterial: 7.41 (ref 7.350–7.450)
pO2, Arterial: 51 mmHg — ABNORMAL LOW (ref 80.0–100.0)

## 2013-02-20 LAB — APTT: aPTT: 38 seconds — ABNORMAL HIGH (ref 24–37)

## 2013-02-20 LAB — MAGNESIUM: Magnesium: 2.3 mg/dL (ref 1.5–2.5)

## 2013-02-20 LAB — URINE MICROSCOPIC-ADD ON

## 2013-02-20 LAB — PRO B NATRIURETIC PEPTIDE: Pro B Natriuretic peptide (BNP): 742.5 pg/mL — ABNORMAL HIGH (ref 0–450)

## 2013-02-20 LAB — PHOSPHORUS: Phosphorus: 3.4 mg/dL (ref 2.3–4.6)

## 2013-02-20 MED ORDER — METHYLPREDNISOLONE SODIUM SUCC 40 MG IJ SOLR
40.0000 mg | Freq: Two times a day (BID) | INTRAMUSCULAR | Status: DC
  Administered 2013-02-20 – 2013-02-21 (×2): 40 mg via INTRAVENOUS
  Filled 2013-02-20 (×4): qty 1

## 2013-02-20 MED ORDER — ALBUTEROL SULFATE (5 MG/ML) 0.5% IN NEBU
2.5000 mg | INHALATION_SOLUTION | Freq: Four times a day (QID) | RESPIRATORY_TRACT | Status: DC
  Administered 2013-02-20 – 2013-02-21 (×3): 2.5 mg via RESPIRATORY_TRACT
  Filled 2013-02-20 (×3): qty 0.5

## 2013-02-20 MED ORDER — PSYLLIUM 95 % PO PACK
1.0000 | PACK | Freq: Every day | ORAL | Status: DC
  Administered 2013-02-20 – 2013-02-23 (×4): 1 via ORAL
  Filled 2013-02-20 (×4): qty 1

## 2013-02-20 MED ORDER — WARFARIN - PHARMACIST DOSING INPATIENT: Freq: Every day | Status: DC

## 2013-02-20 MED ORDER — SIMVASTATIN 40 MG PO TABS
40.0000 mg | ORAL_TABLET | Freq: Every day | ORAL | Status: DC
  Administered 2013-02-20 – 2013-02-22 (×3): 40 mg via ORAL
  Filled 2013-02-20 (×4): qty 1

## 2013-02-20 MED ORDER — SODIUM CHLORIDE 0.9 % IV SOLN: 250.0000 mL | INTRAVENOUS | Status: DC | PRN

## 2013-02-20 MED ORDER — BUDESONIDE 0.25 MG/2ML IN SUSP
0.5000 mg | Freq: Four times a day (QID) | RESPIRATORY_TRACT | Status: DC
  Administered 2013-02-21: 0.5 mg via RESPIRATORY_TRACT
  Filled 2013-02-20 (×7): qty 4

## 2013-02-20 MED ORDER — ASPIRIN 81 MG PO CHEW
324.0000 mg | CHEWABLE_TABLET | ORAL | Status: AC
  Administered 2013-02-20: 324 mg via ORAL
  Filled 2013-02-20: qty 4

## 2013-02-20 MED ORDER — GUAIFENESIN ER 600 MG PO TB12
1200.0000 mg | ORAL_TABLET | Freq: Two times a day (BID) | ORAL | Status: DC
  Administered 2013-02-20 – 2013-02-23 (×6): 1200 mg via ORAL
  Filled 2013-02-20 (×7): qty 2

## 2013-02-20 MED ORDER — IPRATROPIUM BROMIDE 0.02 % IN SOLN
0.5000 mg | Freq: Four times a day (QID) | RESPIRATORY_TRACT | Status: DC
  Administered 2013-02-20 – 2013-02-21 (×3): 0.5 mg via RESPIRATORY_TRACT
  Filled 2013-02-20 (×3): qty 2.5

## 2013-02-20 MED ORDER — ASPIRIN 300 MG RE SUPP
300.0000 mg | RECTAL | Status: AC
  Filled 2013-02-20: qty 1

## 2013-02-20 MED ORDER — FUROSEMIDE 10 MG/ML IJ SOLN
40.0000 mg | Freq: Three times a day (TID) | INTRAMUSCULAR | Status: DC
  Administered 2013-02-20 – 2013-02-21 (×2): 40 mg via INTRAVENOUS
  Filled 2013-02-20 (×5): qty 4

## 2013-02-20 MED ORDER — ENOXAPARIN SODIUM 40 MG/0.4ML ~~LOC~~ SOLN: 40.0000 mg | SUBCUTANEOUS | Status: DC

## 2013-02-20 MED ORDER — WARFARIN SODIUM 7.5 MG PO TABS
7.5000 mg | ORAL_TABLET | Freq: Once | ORAL | Status: AC
  Administered 2013-02-20: 7.5 mg via ORAL
  Filled 2013-02-20: qty 1

## 2013-02-20 MED ORDER — ACETAMINOPHEN 325 MG PO TABS: 650.0000 mg | ORAL_TABLET | Freq: Three times a day (TID) | ORAL | Status: DC | PRN

## 2013-02-20 NOTE — Progress Notes (Signed)
  Subjective:    Patient ID: Tracy [name], male    DOB: 05-17-1926, 79 y.o.   MRN: [id number]  HPI  PCP- [facility]   79 y/o WM for FU of OSA; COPD & new pulmonary nodule noted dec'13  OBSTRUCTIVE SLEEP APNEA - ~ sleep study 3/06 w/ RDI 26 w/ desat to 76%...  COPD - on home O2 .  ~ PFT 11/04 showed FVC=4.51 (103%), FEV1=1.93 (67%) and DLCO~50%...  ~ CT Chest 9/05 & CT abd 1/12 showed marked emphysema w/ blebs, & thor spondylosis...  ~ Office spirometry 11/05 showed FEV1=1.57 (48%)...  ~ PFT 4/10 showed FVC= 3.00 (77%), FEV1= 1.37 (45%)  11/29/2011 Acute OV >> improved with avelox + prednisone taper  09/26/12  Presented with worsening hypoxia  Ct angio showed Subsegmental acute pulmonary thromboembolism in the right lower lobe, 7 mm spiculated lung lesion in the right middle lobe & bullous emphysema  DUplex rt popliteal DVt noted  PFTs -12/13- fev1 52% (1.30 ) -unchanged from last  DLCO 29%  ONO on cpap + 3L O2 showed drop in satn x 2h   12/05/2012  Breathing is unchanged. Has been adjusting his O2 when he needs to. Denies chest pain, chest tightness, SOB or coughing.  plavix was stopped when coumadin started, INR in range although last was 1.9 - being managed by [facility]  91% on 4L [place] - is able to tolerate pulse o2 at rest but continues to desaturate on 4L continuous on wlaking   02/20/2013 Did not feel right, increasing dyspnea On arrival satn 70s required 10 L O2 to bring him upto 93%, uses 6L at home at baseline No cough, wheeze Ruled out DVT 2 weeks ago  but pain and swelling persist in right lower extremity. Unresponsive to trial of  Lasix 20 mg x 5ds. referred to vascular surgery to evaluate for PAD and blockages     Past Medical History  Diagnosis Date  . Chronic airway obstruction, not elsewhere classified   . Unspecified essential hypertension   . Cerebrovascular disease, unspecified   . Other and unspecified hyperlipidemia   . Diaphragmatic hernia without  mention of obstruction or gangrene   . Diverticulosis of colon (without mention of hemorrhage)   . Benign neoplasm of colon   . Overweight   . OSA on CPAP   . On home oxygen therapy   . Right leg pain 01/18/2013  . H/O tobacco use, presenting hazards to health 01/25/2013     Review of Systems neg for any significant sore throat, dysphagia, itching, sneezing, nasal congestion or excess/ purulent secretions, fever, chills, sweats, unintended wt loss, pleuritic or exertional cp, hempoptysis, orthopnea pnd or change in chronic leg swelling. Also denies presyncope, palpitations, heartburn, abdominal pain, nausea, vomiting, diarrhea or change in bowel or urinary habits, dysuria,hematuria, rash, arthralgias, visual complaints, headache, numbness weakness or ataxia.     Objective:   Physical Exam  Gen. Pleasant, well-nourished, in no distress, normal affect ENT - no lesions, no post nasal drip Neck: No JVD, no thyromegaly, no carotid bruits Lungs: no use of accessory muscles, no dullness to percussion, decreaesd BL without rales or rhonchi  Cardiovascular: Rhythm regular, heart sounds  normal, no murmurs, 2+ peripheral edema Abdomen: soft and non-tender, no hepatosplenomegaly, BS normal. Musculoskeletal: No deformities, no cyanosis or clubbing Neuro:  alert, non focal Skin:  Warm, no lesions/ rash       Assessment & Plan:

## 2013-02-20 NOTE — Assessment & Plan Note (Signed)
Ct CPAP , home settings with O2 blended in

## 2013-02-20 NOTE — Assessment & Plan Note (Signed)
Worsening hypoxia requiring 10L cont O2  Admit to hospital  - tele He is Ok with short term extubation if this is a reversible process Will treat as cor pulmonale, doubt PE since on coumadin & recent INR therapeutic  Will rechk labs incl BNP & INR

## 2013-02-20 NOTE — Assessment & Plan Note (Signed)
Will treat as flare with IV solumedrol & bronchodilators.

## 2013-02-20 NOTE — Progress Notes (Addendum)
ANTICOAGULATION CONSULT NOTE - Initial Consult  Pharmacy Consult for Coumadin Indication: history of PE/DVT  Allergies  Allergen Reactions  . Sulfamethoxazole     REACTION: "washes" him out per pt    Patient Measurements:    Vital Signs: BP: 118/84 mmHg (05/27 1347) Pulse Rate: 114 (05/27 1347)  Labs: No results found for this basename: HGB, HCT, PLT, APTT, LABPROT, INR, HEPARINUNFRC, CREATININE, CKTOTAL, CKMB, TROPONINI,  in the last 72 hours  The CrCl is unknown because both a height and weight (above a minimum accepted value) are required for this calculation.   Medical History: Past Medical History  Diagnosis Date  . Chronic airway obstruction, not elsewhere classified   . Unspecified essential hypertension   . Cerebrovascular disease, unspecified   . Other and unspecified hyperlipidemia   . Diaphragmatic hernia without mention of obstruction or gangrene   . Diverticulosis of colon (without mention of hemorrhage)   . Benign neoplasm of colon   . Overweight   . OSA on CPAP   . On home oxygen therapy   . Right leg pain 01/18/2013  . H/O tobacco use, presenting hazards to health 01/25/2013    Assessment: 78 yom with h/o OSA, COPD, pulmonary nodule, PE and DVT (2013) presented 5/27 to PCP office with increased dyspnea, persistent pain/swelling in RLE not responsive to Lasix x 5 days - direct admit to [facility] with referral to vascular surgery.  Patient was on Coumadin PTA for h/o PE and DVT.  Last anti-coag visit 01/31/13 at [facility] noted that patient was on Coumadin 5 mg daily except for 7.5 mg on Mondays and INR was therapeutic at that visit. Awaiting MR to be done and PT/INR to be collected   Goal of Therapy:  INR 2-3 Monitor platelets by anticoagulation protocol: Yes   Plan:   Pharmacy will f/u after medication reconciliation completed and PT/INR returns  Geoffry Paradise, PharmD, BCPS Pager: [phone] 4:33 PM Pharmacy #: [id number]   Addendum:  INR returns as 1.78,  last dose of Coumadin 5/26.  CBC wnl, no bleeding  Plan: Coumadin 7.5 mg po x 1 tonight as boosted dose. Pharmacy will d/c Lovenox ppx dose ordered today. Daily PT/INR, pharmacy will f/u.    Geoffry Paradise, PharmD, BCPS Pager: [phone] 5:56 PM Pharmacy #: [phone]

## 2013-02-20 NOTE — Progress Notes (Signed)
Pt seen and found already wearing home cpap with 6l o2 bleedin.  Hr 90, sats 96%.  Home unit appears to be working well at this time, with no frays or defects noted on cord.  Sterile water added to humidity chamber per pt request.  Pt remains on continuous pulseox at bedside.

## 2013-02-20 NOTE — Progress Notes (Signed)
Patients O2 Sats rechecked 85%-87%, refused  Venti-mask /NRB, pt stated I'm ok I don't need that. Offered /re educate 2x but still refused.

## 2013-02-20 NOTE — H&P (Signed)
PULMONARY  / CRITICAL CARE MEDICINE  Name: Tracy [name] MRN: [id number] DOB: 09-29-1925    ADMISSION DATE:  02/20/2013   CHIEF COMPLAINT:  Increasing dyspnea & hypoxia HISTORY OF PRESENT ILLNESS:  PCP- [facility]  79 y/o WM for FU of OSA; COPD & new pulmonary nodule noted dec'13  OBSTRUCTIVE SLEEP APNEA - ~ sleep study 3/06 w/ RDI 26 w/ desat to 76%...  COPD - on home O2 .  ~ PFT 11/04 showed FVC=4.51 (103%), FEV1=1.93 (67%) and DLCO~50%...  ~ CT Chest 9/05 & CT abd 1/12 showed marked emphysema w/ blebs, & thor spondylosis...  ~ Office spirometry 11/05 showed FEV1=1.57 (48%)...  ~ PFT 4/10 showed FVC= 3.00 (77%), FEV1= 1.37 (45%)   He was diagnosed with small  Subsegmental acute pulmonary thromboembolism in the right lower lobe on 09/26/12 ,  A 7 mm spiculated lung lesion in the right middle lobe & bullous emphysema  DUplex rt popliteal DVt noted , maintained on 6L [place] while walking since then PFTs -12/13- fev1 52% (1.30 ) -unchanged from last  DLCO 29%  ONO on cpap + 3L O2 showed drop in satn x 2h   02/20/2013  Did not feel right, increasing dyspnea  On arrival satn 70s required 10 L O2 to bring him upto 93%, uses 6L at home at baseline  No cough, wheeze  Ruled out DVT 2 weeks ago but pain and swelling persist in right lower extremity. Unresponsive to trial of Lasix 20 mg x 5ds. referred to vascular surgery to evaluate for PAD and blockages      PAST MEDICAL HISTORY :  Past Medical History  Diagnosis Date  . Chronic airway obstruction, not elsewhere classified   . Unspecified essential hypertension   . Cerebrovascular disease, unspecified   . Other and unspecified hyperlipidemia   . Diaphragmatic hernia without mention of obstruction or gangrene   . Diverticulosis of colon (without mention of hemorrhage)   . Benign neoplasm of colon   . Overweight   . OSA on CPAP   . On home oxygen therapy   . Right leg pain 01/18/2013  . H/O tobacco use, presenting hazards to health  01/25/2013   Past Surgical History  Procedure Laterality Date  . Hernia repair    . Nephroureterectomy     Prior to Admission medications   Medication Sig Start Date End Date Taking? Authorizing Provider  acetaminophen (TYLENOL ARTHRITIS PAIN) 650 MG CR tablet Take 650 mg by mouth as needed.     Historical Provider, MD  fish oil-omega-3 fatty acids 1000 MG capsule Take 1 g by mouth daily.     Historical Provider, MD  Fluticasone-Salmeterol (ADVAIR DISKUS) 250-50 MCG/DOSE AEPB Inhale 1 puff into the lungs every 12 (twelve) hours. 06/23/12 06/23/13  Edwyna Perfect, MD  guaiFENesin (MUCINEX) 600 MG 12 hr tablet Take 1,200 mg by mouth 2 (two) times daily.     Historical Provider, MD  losartan (COZAAR) 50 MG tablet Take 1 tablet (50 mg total) by mouth daily. 10/11/12   Edwyna Perfect, MD  Psyllium (METAMUCIL PO) Take by mouth every morning.    Historical Provider, MD  simvastatin (ZOCOR) 40 MG tablet Take 1 tablet (40 mg total) by mouth at bedtime. 06/23/12   Edwyna Perfect, MD  tiotropium (SPIRIVA) 18 MCG inhalation capsule Place 1 capsule (18 mcg total) into inhaler and inhale daily. 06/23/12   Edwyna Perfect, MD  warfarin (COUMADIN) 5 MG tablet Take 1 tablet (5 mg total) by  mouth one time only at 6 PM. 09/19/12   Vilinda Blanks Minor, NP   Allergies  Allergen Reactions  . Sulfamethoxazole Other (See Comments)    REACTION: "washes" him out per pt    FAMILY HISTORY:  History reviewed. No pertinent family history. SOCIAL HISTORY:  reports that he quit smoking about 34 years ago. His smoking use included Cigarettes. He smoked 1.50 packs per day. He has never used smokeless tobacco. He reports that he does not drink alcohol or use illicit drugs.  REVIEW OF SYSTEMS:   Constitutional: Negative for fever, chills, weight loss, malaise/fatigue and diaphoresis.  HENT: Negative for hearing loss, ear pain, nosebleeds, congestion, sore throat, neck pain, tinnitus and ear discharge.   Eyes: Negative for  blurred vision, double vision, photophobia, pain, discharge and redness.  Respiratory: Negative for cough, hemoptysis, sputum production,  wheezing and stridor.   Cardiovascular: Negative for chest pain, palpitations, orthopnea, claudication,  and PND.  Gastrointestinal: Negative for heartburn, nausea, vomiting, abdominal pain, diarrhea, constipation, blood in stool and melena.  Genitourinary: Negative for dysuria, urgency, frequency, hematuria and flank pain.  Musculoskeletal: Negative for myalgias, back pain, joint pain and falls.  Skin: Negative for itching and rash.  Neurological: Negative for dizziness, tingling, tremors, sensory change, speech change, focal weakness, seizures, loss of consciousness, weakness and headaches.  Endo/Heme/Allergies: Negative for environmental allergies and polydipsia. Does not bruise/bleed easily.  SUBJECTIVE:   VITAL SIGNS: Temp:  [98.1 F (36.7 C)] 98.1 F (36.7 C) (05/27 1636) Pulse Rate:  [109-114] 109 (05/27 1636) BP: (118-140)/(84-89) 140/89 mmHg (05/27 1636) SpO2:  [84 %-90 %] 88 % (05/27 1636) Weight:  [207 lb (93.895 kg)] 207 lb (93.895 kg) (05/27 1347)  PHYSICAL EXAMINATION: Gen. Pleasant, well-nourished, in no distress, normal affect ENT - no lesions, no post nasal drip Neck: No JVD, no thyromegaly, no carotid bruits Lungs: no use of accessory muscles, no dullness to percussion, decreased without rales or rhonchi  Cardiovascular: Rhythm regular, heart sounds  normal, no murmurs, 2+ peripheral edema Abdomen: soft and non-tender, no hepatosplenomegaly, BS normal. Musculoskeletal: No deformities, no cyanosis or clubbing Neuro:  alert, non focal Skin:  Warm, no lesions/ rash   No results found for this basename: NA, K, CL, CO2, BUN, CREATININE, GLUCOSE,  in the last 168 hours No results found for this basename: HGB, HCT, WBC, PLT,  in the last 168 hours No results found.  ASSESSMENT / PLAN:  Acute respiratory failure with hypoxia  -  Worsening hypoxia requiring 10L cont O2  Admit to hospital - tele  He is Ok with short term extubation if this is a reversible process  Will treat as cor pulmonale, doubt PE since on coumadin & recent INR therapeutic  Will rechk labs incl BNP & INR   COPD exacerbation - Will treat as flare with IV solumedrol & bronchodilators  OSA- Ct CPAP , home settings with O2 blended in He will bring in his own machine  Pulmonary and Critical Care Medicine [facility] Pager: [phone]  02/20/2013, 4:50 PM

## 2013-02-21 DIAGNOSIS — R5381 Other malaise: Secondary | ICD-10-CM

## 2013-02-21 DIAGNOSIS — I517 Cardiomegaly: Secondary | ICD-10-CM

## 2013-02-21 DIAGNOSIS — I2699 Other pulmonary embolism without acute cor pulmonale: Secondary | ICD-10-CM

## 2013-02-21 LAB — BASIC METABOLIC PANEL
Calcium: 9.3 mg/dL (ref 8.4–10.5)
GFR calc Af Amer: 53 mL/min — ABNORMAL LOW (ref >90)
GFR calc non Af Amer: 46 mL/min — ABNORMAL LOW (ref >90)
Glucose, Bld: 142 mg/dL — ABNORMAL HIGH (ref 70–99)
Potassium: 4.3 mEq/L (ref 3.5–5.1)
Sodium: 138 mEq/L (ref 135–145)

## 2013-02-21 LAB — PROTIME-INR
INR: 1.79 — ABNORMAL HIGH (ref 0.00–1.49)
Prothrombin Time: 20.2 seconds — ABNORMAL HIGH (ref 11.6–15.2)

## 2013-02-21 MED ORDER — LOSARTAN POTASSIUM 50 MG PO TABS
50.0000 mg | ORAL_TABLET | Freq: Every day | ORAL | Status: DC
  Administered 2013-02-21 – 2013-02-22 (×2): 50 mg via ORAL
  Filled 2013-02-21 (×3): qty 1

## 2013-02-21 MED ORDER — METHYLPREDNISOLONE SODIUM SUCC 40 MG IJ SOLR
20.0000 mg | Freq: Two times a day (BID) | INTRAMUSCULAR | Status: DC
  Administered 2013-02-21 – 2013-02-22 (×2): 20 mg via INTRAVENOUS
  Filled 2013-02-21 (×4): qty 0.5

## 2013-02-21 MED ORDER — ALBUTEROL SULFATE (5 MG/ML) 0.5% IN NEBU
2.5000 mg | INHALATION_SOLUTION | RESPIRATORY_TRACT | Status: DC | PRN
  Filled 2013-02-21: qty 0.5

## 2013-02-21 MED ORDER — IPRATROPIUM BROMIDE 0.02 % IN SOLN
0.5000 mg | RESPIRATORY_TRACT | Status: DC | PRN
  Filled 2013-02-21: qty 2.5

## 2013-02-21 MED ORDER — FUROSEMIDE 40 MG PO TABS
40.0000 mg | ORAL_TABLET | Freq: Two times a day (BID) | ORAL | Status: DC
  Administered 2013-02-21 – 2013-02-23 (×5): 40 mg via ORAL
  Filled 2013-02-21 (×7): qty 1

## 2013-02-21 MED ORDER — BUDESONIDE 0.5 MG/2ML IN SUSP
0.5000 mg | Freq: Four times a day (QID) | RESPIRATORY_TRACT | Status: DC
  Administered 2013-02-21: 0.5 mg via RESPIRATORY_TRACT
  Filled 2013-02-21 (×5): qty 2

## 2013-02-21 MED ORDER — BUDESONIDE 0.5 MG/2ML IN SUSP
0.5000 mg | Freq: Two times a day (BID) | RESPIRATORY_TRACT | Status: DC
  Filled 2013-02-21 (×6): qty 2

## 2013-02-21 MED ORDER — TIOTROPIUM BROMIDE MONOHYDRATE 18 MCG IN CAPS
18.0000 ug | ORAL_CAPSULE | Freq: Every day | RESPIRATORY_TRACT | Status: DC
  Administered 2013-02-21 – 2013-02-23 (×3): 18 ug via RESPIRATORY_TRACT
  Filled 2013-02-21: qty 5

## 2013-02-21 MED ORDER — ARFORMOTEROL TARTRATE 15 MCG/2ML IN NEBU
15.0000 ug | INHALATION_SOLUTION | Freq: Two times a day (BID) | RESPIRATORY_TRACT | Status: DC
  Administered 2013-02-21 (×2): 15 ug via RESPIRATORY_TRACT
  Filled 2013-02-21 (×6): qty 2

## 2013-02-21 MED ORDER — WARFARIN SODIUM 7.5 MG PO TABS
7.5000 mg | ORAL_TABLET | Freq: Once | ORAL | Status: AC
  Administered 2013-02-21: 7.5 mg via ORAL
  Filled 2013-02-21: qty 1

## 2013-02-21 NOTE — Progress Notes (Signed)
CSW received referral that patient was admitted from facility - CSW confirmed with facility that patient is from [facility].   Unice Bailey, LCSW [facility] Clinical Social Worker cell #: [phone]

## 2013-02-21 NOTE — Progress Notes (Signed)
*  PRELIMINARY RESULTS* Echocardiogram 2D Echocardiogram has been performed.  Tracy [name] 02/21/2013, 1:58 PM

## 2013-02-21 NOTE — Progress Notes (Signed)
ANTICOAGULATION CONSULT NOTE - Follow Up Consult  Pharmacy Consult for Warfarin Indication: History of PE/DVT  Allergies  Allergen Reactions  . Sulfamethoxazole Other (See Comments)    REACTION: "washes" him out per pt    Patient Measurements: Height: 5\' 10"  (177.8 cm) Weight: 199 lb 4.7 oz (90.4 kg) IBW/kg (Calculated) : 73  Vital Signs: Temp: 97.8 F (36.6 C) (05/28 0434) Temp src: Oral (05/28 0434) BP: 126/74 mmHg (05/28 0434) Pulse Rate: 102 (05/28 0434)  Labs:  Recent Labs  02/20/13 1702 02/21/13 0435  HGB 14.5  --   HCT 45.8  --   PLT 301  --   APTT 38*  --   LABPROT 20.1* 20.2*  INR 1.78* 1.79*  CREATININE 1.08 1.37*  TROPONINI <0.30  --     Estimated Creatinine Clearance: 45.4 ml/min (by C-G formula based on Cr of 1.37).   Medications:  Scheduled:  . arformoterol  15 mcg Nebulization BID  . budesonide (PULMICORT) nebulizer solution  0.5 mg Nebulization BID  . furosemide  40 mg Oral BID  . guaiFENesin  1,200 mg Oral BID  . losartan  50 mg Oral Daily  . methylPREDNISolone (SOLU-MEDROL) injection  20 mg Intravenous Q12H  . psyllium  1 packet Oral Daily  . simvastatin  40 mg Oral QHS  . tiotropium  18 mcg Inhalation Daily  . Warfarin - Pharmacist Dosing Inpatient   Does not apply q1800    Assessment: 77 yom with COPD on home O2 admitted with progressive dyspnea and hypoxia. On chronic warfarin for history of PE/DVT - dose 5mg  daily except 7.5mg  on Mondays. INR on admit was 1.78 on 5/27.  INR remains slightly subtherapeutic 1.79 today  CBC stable, no bleeding/complications reported.  No drug interactions identified  Goal of Therapy:  INR 2-3   Plan:   Repeat warfarin 7.5mg  today  Daily PT/INR  Loralee Pacas, PharmD, BCPS Pager: [phone] 02/21/2013,11:43 AM

## 2013-02-21 NOTE — Care Management Note (Signed)
    Page 1 of 1   02/21/2013     12:01:35 PM   CARE MANAGEMENT NOTE 02/21/2013  Patient:  Tracy [name], Tracy [name]   Account Number:  [id number]  Date Initiated:  02/21/2013  Documentation initiated by:  Lorenda Ishihara  Subjective/Objective Assessment:   79 yo male admitted s/p hypoxia. PTA lived at [facility].     Action/Plan:   May need higher level of care depending on progress.   Anticipated DC Date:  02/24/2013   Anticipated DC Plan:    In-house referral  Clinical Social Worker      DC Planning Services  CM consult      Choice offered to / List presented to:             Status of service:  In process, will continue to follow Medicare Important Message given?   (If response is "NO", the following Medicare IM given date fields will be blank) Date Medicare IM given:   Date Additional Medicare IM given:    Discharge Disposition:    Per UR Regulation:  Reviewed for med. necessity/level of care/duration of stay  If discussed at Long Length of Stay Meetings, dates discussed:    Comments:

## 2013-02-21 NOTE — Progress Notes (Signed)
PULMONARY  / CRITICAL CARE MEDICINE  Name: Tracy [name] MRN: [id number] DOB: May 03, 1926    ADMISSION DATE:  02/20/2013  CHIEF COMPLAINT:  Dyspnea.  BRIEF PATIENT DESCRIPTION:  79 yo male former smoker seen in pulmonary office with progressive dyspnea and hypoxia prompting admission.  He is followed by Dr. Vassie Loll for COPD with emphysema, chronic hypoxic respiratory failure (baseline O2 needs 6 liters), OSA on CPAP, and Rt leg DVT/PE Dx December 2013 on coumadin therapy.  SIGNIFICANT EVENTS: 5/27 Admit from pulmonary office  STUDIES:  09/15/12 PFT >> FEV1 1.38 (55%), FEV1% 44, TLC 6.71 (107%), DLCO 29%, no BD response  SUBJECTIVE:  Denies sinus congestion, fever, chest pain, cough, wheeze, or sputum.  His breathing is okay at rest.  He gets weak, dizzy, and winded with any activity.  He reports his pulse ox readings at home drop into 60's with activity, but quickly recover with rest.  VITAL SIGNS: Temp:  [97.8 F (36.6 C)-98.5 F (36.9 C)] 97.8 F (36.6 C) (05/28 0434) Pulse Rate:  [90-114] 102 (05/28 0434) Resp:  [16-25] 16 (05/28 0434) BP: (118-140)/(54-89) 126/74 mmHg (05/28 0434) SpO2:  [84 %-96 %] 90 % (05/28 0819) Weight:  [199 lb 4.7 oz (90.4 kg)-207 lb (93.895 kg)] 199 lb 4.7 oz (90.4 kg) (05/28 0434)  PHYSICAL EXAMINATION: General:  No distress Neuro: Alert, normal strength, moves all extremities HEENT: No sinus tenderness, no oral exudate Cardiovascular:  s1s2 regular, no murmur Lungs:  Decreased breath sounds, prolonged exhalation, no wheeze/rales Abdomen:  Soft, non tender Musculoskeletal:  Rt > Lt ankle edema Skin:  No rashes   Recent Labs Lab 02/20/13 1702 02/21/13 0435  NA 142 138  K 4.2 4.3  CL 106 99  CO2 27 29  BUN 25* 28*  CREATININE 1.08 1.37*  GLUCOSE 97 142*    Recent Labs Lab 02/20/13 1702  HGB 14.5  HCT 45.8  WBC 9.1  PLT 301   Lab Results  Component Value Date   INR 1.79* 02/21/2013   INR 1.78* 02/20/2013   INR 2.2 01/31/2013     Dg Chest 2 View  02/20/2013   *RADIOLOGY REPORT*  Clinical Data: Hypoxia, COPD  CHEST - 2 VIEW  Comparison: Chest x-ray of 11/29/2011 and CT angio chest of 09/18/2012  Findings: The lungs are hyperaerated consistent with emphysema and chronic changes with scarring remain at the lung bases.  No focal infiltrate or effusion is seen.  The small nodule noted previously within the right middle lobe on CT is not discernible on chest x- ray.  Mild cardiomegaly is stable and there are diffuse degenerative changes throughout the thoracic spine.  IMPRESSION: Emphysema with scarring remaining primarily in the lung bases.  No active process.   Original Report Authenticated By: Dwyane Dee, M.D.    ASSESSMENT / PLAN:  Acute on chronic hypoxic respiratory failure. Plan: -adjust oxygen to keep SpO2 > 88% >> may need to set up oximyzer  Acute COPD exacerbation. Plan: -Continue pulmicort -add brovana -resume spiriva -prn albuterol -wean of steroids as tolerated -hold outpt advair for now  Acute on chronic cor pulmonale. Plan: -continue diuresis with lasix >> change to 40 mg po bid -optimize oxygenation -check Echo  Hx of OSA. 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 Plan: -coumadin per pharmacy with goal INR 2 to 3  Hx of HTN, hyperlipidemia. Plan: -continue cozaar, zocor  Coralyn Helling, MD [facility] Pulmonary/Critical Care 02/21/2013, 8:51 AM Pager:  [phone] After 3pm call: [phone]

## 2013-02-21 NOTE — Progress Notes (Signed)
Patients heart rate changed on monitor to afib. EKG done and revealed a sinus rhythm of 75. Dr. Vassie Loll was notified, no new orders, will continue to monitor patient.  Setzer, Don Broach

## 2013-02-22 ENCOUNTER — Telehealth: Payer: Self-pay | Admitting: Hematology & Oncology

## 2013-02-22 ENCOUNTER — Other Ambulatory Visit: Payer: Medicare Other | Admitting: Lab

## 2013-02-22 ENCOUNTER — Ambulatory Visit: Payer: Medicare Other | Admitting: Medical

## 2013-02-22 DIAGNOSIS — I2609 Other pulmonary embolism with acute cor pulmonale: Secondary | ICD-10-CM

## 2013-02-22 LAB — BASIC METABOLIC PANEL
Calcium: 9.2 mg/dL (ref 8.4–10.5)
Creatinine, Ser: 1.3 mg/dL (ref 0.50–1.35)
GFR calc Af Amer: 56 mL/min — ABNORMAL LOW (ref >90)

## 2013-02-22 LAB — PROTIME-INR
INR: 2.09 — ABNORMAL HIGH (ref 0.00–1.49)
Prothrombin Time: 22.6 seconds — ABNORMAL HIGH (ref 11.6–15.2)

## 2013-02-22 MED ORDER — PREDNISONE 20 MG PO TABS
30.0000 mg | ORAL_TABLET | Freq: Every day | ORAL | Status: DC
  Administered 2013-02-22 – 2013-02-23 (×2): 30 mg via ORAL
  Filled 2013-02-22 (×3): qty 1

## 2013-02-22 MED ORDER — PREDNISONE 10 MG PO TABS
10.0000 mg | ORAL_TABLET | Freq: Every day | ORAL | Status: DC
  Filled 2013-02-22 (×2): qty 1

## 2013-02-22 MED ORDER — WARFARIN SODIUM 7.5 MG PO TABS
7.5000 mg | ORAL_TABLET | Freq: Once | ORAL | Status: AC
  Administered 2013-02-22: 7.5 mg via ORAL
  Filled 2013-02-22: qty 1

## 2013-02-22 MED ORDER — MOMETASONE FURO-FORMOTEROL FUM 100-5 MCG/ACT IN AERO
2.0000 | INHALATION_SPRAY | Freq: Two times a day (BID) | RESPIRATORY_TRACT | Status: DC
  Administered 2013-02-22 – 2013-02-23 (×3): 2 via RESPIRATORY_TRACT
  Filled 2013-02-22: qty 8.8

## 2013-02-22 NOTE — Progress Notes (Signed)
Patient told RT that she would place himself on cpap when ready.

## 2013-02-22 NOTE — Telephone Encounter (Signed)
Pt cx 5-29 he is in the hospital will call to reschedule when he gets out.

## 2013-02-22 NOTE — Discharge Summary (Signed)
Physician Discharge Summary  Patient ID: Tracy [name] MRN: [id number] DOB/AGE: 1926/05/08 79 y.o.  Admit date: 02/20/2013 Discharge date: 02/23/2013  Discharge Diagnoses:  Active Problems:   OBSTRUCTIVE SLEEP APNEA   Acute respiratory failure with hypoxia   Acute cor pulmonale  Hospital Summary: Tracy [name] is a 79 y.o. y/o male, former smoker, with a PMH of COPD with emphysema, chronic hypoxic respiratory failure (baseline O2 needs 6 liters), OSA on CPAP, and Rt leg DVT/PE Dx December 2013 on coumadin therapy.   Seen in pulmonary office with progressive dyspnea and hypoxia prompting admission. Admitted for acute on chronic respiratory failure in the setting of: 1) AECOPD 2) Decompensated Cor-pulmonale Superimposed un additional co-morbids of  3) OSA  4) prior PE  He was admitted to the hospital for further evaluation. Treatment and in-patient therapeutic response as noted below:   Acute COPD exacerbation.  Treated in the usual fashion which included: supplemental oxygen, bronchodilators, and systemic steroids. He has made marginal improvement, however it seems as though his dyspnea was more a issue of progression of his chronic disease and chronic hypoxia. He was transitioned to oral steroids, his home BD regimen was resumed, additional therapy focused on treating decompensated cor-poulmonale and volume excess as well as oxygen deelivery system.  Discharge instructions:  -resume advair  -continue spiriva  -prn albuterol  -wean off oral steroids -continue mucinex   Acute on chronic hypoxic respiratory failure.  Concern that much of his recent difficulty is related to oxygen desaturation with exertion. No evidence for shunt on bubble Echo. Still desaturated on 6 liters w/ activity and therefore he was provided w/ oximizer prior to d/c.  Discharge Instruction:  -oxygen at 5L via oximizer at rest / night and up to 8 L with activity, will need to ensure oximyzer set up for  discharge -follow up with NP in Pulmonary office 1 wk post discharge - see below  Acute on chronic cor pulmonale.  Discharge Instructions: -continue diuresis with lasix >> continue 40mg  QD and reassess in office -optimize oxygenation   Hx of OSA.  Discharge Instructions: -continue CPAP qhs with supplemental oxygen   Hx of PE/Rt leg DVT on chronic coumadin.  Discharge Instructions: -continue coumadin -  goal INR 2 to 3  -will need follow up INR on Monday (6/2) with  - orders arranged & faxed to [facility]  Hx of HTN, hyperlipidemia.   Instructions: -continue cozaar, zocor    SIGNIFICANT EVENTS:  5/27 Admit from pulmonary office   STUDIES:  09/15/12 PFT >> FEV1 1.38 (55%), FEV1% 44, TLC 6.71 (107%), DLCO 29%, no BD response  02/21/14 Echo with Bubble study >> mild LVH, EF 60 to 65%, mild AS, PAS 51 mmHg, no PFO  Discharge Exam: General: No distress  Neuro: Alert, normal strength, moves all extremities  HEENT: No sinus tenderness, no oral exudate  Cardiovascular: s1s2 regular, no murmur  Lungs: Decreased breath sounds, prolonged exhalation, no wheeze/rales  Abdomen: Soft, non tender  Musculoskeletal: no edema  Skin: No rashes   Filed Vitals:   02/22/13 1924 02/22/13 2055 02/23/13 0552 02/23/13 0822  BP:  139/99 124/81   Pulse:  77 64   Temp:  98.1 F (36.7 C) 98.1 F (36.7 C)   TempSrc:  Oral Oral   Resp:  20 20   Height:      Weight:   88.4 kg (194 lb 14.2 oz)   SpO2: 96% 94% 91% 92%     Discharge Labs  BMET  Recent Labs Lab 02/20/13 1702 02/21/13 0435 02/22/13 0432 02/23/13 0452  NA 142 138 137 135  K 4.2 4.3 4.2 3.9  CL 106 99 97 94*  CO2 27 29 29 29   GLUCOSE 97 142* 126* 94  BUN 25* 28* 32* 41*  CREATININE 1.08 1.37* 1.30 1.26  CALCIUM 9.3 9.3 9.2 8.9  MG 2.3  --   --   --   PHOS 3.4  --   --   --    CBC  Recent Labs Lab 02/20/13 1702  HGB 14.5  HCT 45.8  WBC 9.1  PLT 301   Anti-Coagulation  Recent Labs Lab  02/20/13 1702 02/21/13 0435 02/22/13 0432 02/23/13 0452  INR 1.78* 1.79* 2.09* 2.34*    Discharge Orders   Future Appointments Provider Department Dept Phone   03/08/2013 10:00 AM [facility] Lab 4 [facility] [phone]   03/08/2013 10:30 AM [facility] Lab 2 [facility] [phone]   03/08/2013 11:30 AM Sherren Kerns, MD [facility] [phone]   03/13/2013 9:00 AM Julio Sicks, NP [facility] [place] [phone]   03/19/2013 1:30 PM [facility]Ct 1 [facility] [phone]   Patient to arrive 15 minutes prior to appointment time. No solid food 4 hours prior to exam. Liquids and Medicines are okay.   04/17/2013 9:15 AM Bradd Canary, MD [facility] [phone]   Future Orders Complete By Expires     Diet - low sodium heart healthy  As directed     Face-to-face encounter (required for Medicare/Medicaid patients)  As directed     Comments:      I BABCOCK,PETE certify that this patient is under my care and that I, or a nurse practitioner or physician's assistant working with me, had a face-to-face encounter that meets the physician face-to-face encounter requirements with this patient on 02/23/2013. The encounter with the patient was in whole, or in part for the following medical condition(s) which is the primary reason for home health care (List medical condition):  To assess portable oxygen requirements -also assess ambulatory options for oximizer Needs 5 liters rest, 8 with activity.    Questions:      The encounter with the patient was in whole, or in part, for the following medical condition, which is the primary reason for home health care:  chronic resp failure    I certify that, based on my findings, the following services are medically necessary home health services:  Nursing    My clinical findings support the need for the above  services:  OTHER SEE COMMENTS    Further, I certify that my clinical findings support that this patient is homebound due to:  Ambulates short distances less than 300 feet    Reason for Medically Necessary Home Health Services:  Other See Comments    For home use only DME oxygen  As directed     Comments:      5-8 liters via oximizer Increase to 8 with activity    Questions:      Mode or (Route):  Nasal cannula    Liters per Minute:  5    Frequency:      Oxygen conserving device:      Home Health  As directed     Questions:      To provide the following care/treatments:  Respiratory Care    Increase activity slowly  As  directed             Follow-up Information   Follow up with Oretha Milch., MD On 03/13/2013. (9am)    Contact information:   [address] [place] [address]          Medication List    TAKE these medications       fish oil-omega-3 fatty acids 1000 MG capsule  Take 1 g by mouth daily.     Fluticasone-Salmeterol 250-50 MCG/DOSE Aepb  Commonly known as:  ADVAIR DISKUS  Inhale 1 puff into the lungs every 12 (twelve) hours.     furosemide 40 MG tablet  Commonly known as:  LASIX  Take 1 tablet (40 mg total) by mouth daily.     guaiFENesin 600 MG 12 hr tablet  Commonly known as:  MUCINEX  Take 1,200 mg by mouth 2 (two) times daily.     losartan 50 MG tablet  Commonly known as:  COZAAR  Take 1 tablet (50 mg total) by mouth daily.     predniSONE 10 MG tablet  Commonly known as:  DELTASONE  Take 3 tabs daily for 4 d, then 2 tab daily for 4d, then 1 tab daily for 4d and d/c     psyllium 58.6 % packet  Commonly known as:  METAMUCIL  Take 1 packet by mouth daily.     simvastatin 40 MG tablet  Commonly known as:  ZOCOR  Take 1 tablet (40 mg total) by mouth at bedtime.     tiotropium 18 MCG inhalation capsule  Commonly known as:  SPIRIVA  Place 1 capsule (18 mcg total) into inhaler and inhale daily.     TYLENOL ARTHRITIS PAIN  650 MG CR tablet  Generic drug:  acetaminophen  Take 650 mg by mouth as needed.     warfarin 5 MG tablet  Commonly known as:  COUMADIN  Take 5-7.5 mg by mouth daily. Coumadin 5mg  once a day except for 7.5 mg on Mondays.        Disposition: Discharge home.    Discharged Condition: Tracy [name] has met maximum benefit of inpatient care and is medically stable and cleared for discharge.  He ambulated around entire nursing hallway on 6 liters oximizer. His sats dropped down to 78%., this improved with increasing oxygen to 8 liters. He will be discharge to home with instructions to stay on 5 liters rest, and increase up to 8 liters with activity.    Time spent on disposition:  Greater than 35 minutes.   Coralyn Helling, MD [facility] Pulmonary/Critical Care 02/23/2013, 2:30 PM Pager:  [phone] After 3pm call: [phone]

## 2013-02-22 NOTE — Progress Notes (Signed)
Patient has his home cpap unit and told RT that he will place it on himself. RT will continue to monitor.

## 2013-02-22 NOTE — Progress Notes (Signed)
ANTICOAGULATION CONSULT NOTE - Follow Up  Pharmacy Consult for Warfarin Indication: History of PE/DVT  Allergies  Allergen Reactions  . Sulfamethoxazole Other (See Comments)    REACTION: "washes" him out per pt    Patient Measurements: Height: 5\' 10"  (177.8 cm) Weight: 195 lb 15.8 oz (88.9 kg) IBW/kg (Calculated) : 73  Labs:  Recent Labs  02/20/13 1702 02/21/13 0435 02/22/13 0432  HGB 14.5  --   --   HCT 45.8  --   --   PLT 301  --   --   APTT 38*  --   --   LABPROT 20.1* 20.2* 22.6*  INR 1.78* 1.79* 2.09*  CREATININE 1.08 1.37* 1.30  TROPONINI <0.30  --   --     Estimated Creatinine Clearance: 47.5 ml/min (by C-G formula based on Cr of 1.3).   Medications:  Scheduled:  . furosemide  40 mg Oral BID  . guaiFENesin  1,200 mg Oral BID  . losartan  50 mg Oral Daily  . mometasone-formoterol  2 puff Inhalation BID  . predniSONE  30 mg Oral Q breakfast  . psyllium  1 packet Oral Daily  . simvastatin  40 mg Oral QHS  . tiotropium  18 mcg Inhalation Daily  . Warfarin - Pharmacist Dosing Inpatient   Does not apply q1800    Assessment: 79 yo M with COPD on home O2 admitted 02/20/13 with progressive dyspnea and hypoxia. On chronic warfarin for history of PE/DVT in Dec 2013- home dose 5mg  daily except 7.5mg  on Mondays. INR on admit was 1.78 on 5/27.  INR back in goal range this am (20.9)  No bleeding/complications reported.  No drug interactions identified  Goal of Therapy:  INR 2-3   Plan:   Repeat warfarin 7.5mg  today  Daily PT/INR  Darrol Angel, PharmD Pager: [phone] 02/22/2013,10:30 AM

## 2013-02-22 NOTE — Progress Notes (Signed)
Patient ambulated in hallway on 6 L of oxygen. Patient started out with an oxygen saturation of 88%.  After ambulating 100 feet, oxygen saturation was 74%.  Mild shortness of breath noted.  Patient walked a total of >200 feet. Patient sat back in his chair and rested until a saturation of 89% was reached. Will continue to monitor patient. Setzer, Don Broach

## 2013-02-22 NOTE — Progress Notes (Signed)
PULMONARY  / CRITICAL CARE MEDICINE  Name: Tracy [name] MRN: [id number] DOB: [birth year]    ADMISSION DATE:  02/20/2013  CHIEF COMPLAINT:  Dyspnea.  BRIEF PATIENT DESCRIPTION:  79 yo male former smoker seen in pulmonary office with progressive dyspnea and hypoxia prompting admission.  He is followed by Dr. Vassie Loll for COPD with emphysema, chronic hypoxic respiratory failure (baseline O2 needs 6 liters), OSA on CPAP, and Rt leg DVT/PE Dx December 2013 on coumadin therapy.  SIGNIFICANT EVENTS: 5/27 Admit from pulmonary office  STUDIES:  09/15/12 PFT >> FEV1 1.38 (55%), FEV1% 44, TLC 6.71 (107%), DLCO 29%, no BD response 02/21/14 Echo with Bubble study >> mild LVH, EF 60 to 65%, mild AS, PAS 51 mmHg, no PFO  SUBJECTIVE:  Feels like he is getting more oxygen into his system.  Had coughing spell last night, but better this morning.  Denies chest pain or wheeze.  Hasn't noticed any difference between advair and using pulmicort/brovana in hospital.  VITAL SIGNS: Temp:  [97.6 F (36.4 C)-99.1 F (37.3 C)] 97.6 F (36.4 C) (05/29 0426) Pulse Rate:  [69-109] 71 (05/29 0426) Resp:  [18] 18 (05/29 0426) BP: (127-152)/(66-83) 152/83 mmHg (05/29 0426) SpO2:  [90 %-98 %] 93 % (05/29 0426) FiO2 (%):  [93 %] 93 % (05/28 1504) Weight:  [195 lb 15.8 oz (88.9 kg)] 195 lb 15.8 oz (88.9 kg) (05/29 0426)  PHYSICAL EXAMINATION: General:  No distress Neuro: Alert, normal strength, moves all extremities HEENT: No sinus tenderness, no oral exudate Cardiovascular:  s1s2 regular, no murmur Lungs:  Decreased breath sounds, prolonged exhalation, no wheeze/rales Abdomen:  Soft, non tender Musculoskeletal:  no edema Skin:  No rashes   Recent Labs Lab 02/20/13 1702 02/21/13 0435 02/22/13 0432  NA 142 138 137  K 4.2 4.3 4.2  CL 106 99 97  CO2 27 29 29   BUN 25* 28* 32*  CREATININE 1.08 1.37* 1.30  GLUCOSE 97 142* 126*    Recent Labs Lab 02/20/13 1702  HGB 14.5  HCT 45.8  WBC 9.1  PLT 301    Lab Results  Component Value Date   INR 2.09* 02/22/2013   INR 1.79* 02/21/2013   INR 1.78* 02/20/2013    Dg Chest 2 View  02/20/2013   *RADIOLOGY REPORT*  Clinical Data: Hypoxia, COPD  CHEST - 2 VIEW  Comparison: Chest x-ray of 11/29/2011 and CT angio chest of 09/18/2012  Findings: The lungs are hyperaerated consistent with emphysema and chronic changes with scarring remain at the lung bases.  No focal infiltrate or effusion is seen.  The small nodule noted previously within the right middle lobe on CT is not discernible on chest x- ray.  Mild cardiomegaly is stable and there are diffuse degenerative changes throughout the thoracic spine.  IMPRESSION: Emphysema with scarring remaining primarily in the lung bases.  No active process.   Original Report Authenticated By: Dwyane Dee, M.D.    ASSESSMENT / PLAN:  Acute on chronic hypoxic respiratory failure. Concern that much of his recent difficulty is related to oxygen desaturation with exertion.  No evidence for shunt on bubble Echo. Plan: -adjust oxygen to keep SpO2 > 88%  -will need to check ambulatory oximetry >> may need to set up oximyzer  Acute COPD exacerbation. Plan: -d/c pulmicort, brovana -resume advair >> use dulera while in hospital since advair non formulary med -rcontinue spiriva -prn albuterol -wean off steroid >> transition to prednisone 5/29 -add flutter valve -continue mucinex  Acute on chronic cor pulmonale.  Plan: -continue diuresis with lasix >> continue 40 mg po bid -optimize oxygenation  Hx of OSA. 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 Plan: -coumadin per pharmacy with goal INR 2 to 3  Hx of HTN, hyperlipidemia. Plan: -continue cozaar, zocor  Summary: Improved.  May concern is determining optimal oxygen set up at home particularly with exertion.  If he remains stable with transition off IV steroids, then plan for d/c home 5/30.  Coralyn Helling,  MD [facility] Pulmonary/Critical Care 02/22/2013, 7:49 AM Pager:  [phone] After 3pm call: [phone]

## 2013-02-23 DIAGNOSIS — I10 Essential (primary) hypertension: Secondary | ICD-10-CM

## 2013-02-23 LAB — BASIC METABOLIC PANEL
BUN: 41 mg/dL — ABNORMAL HIGH (ref 6–23)
Creatinine, Ser: 1.26 mg/dL (ref 0.50–1.35)
GFR calc Af Amer: 59 mL/min — ABNORMAL LOW (ref >90)
GFR calc non Af Amer: 51 mL/min — ABNORMAL LOW (ref >90)

## 2013-02-23 LAB — PROTIME-INR: Prothrombin Time: 24.6 seconds — ABNORMAL HIGH (ref 11.6–15.2)

## 2013-02-23 MED ORDER — FUROSEMIDE 40 MG PO TABS: 40.0000 mg | ORAL_TABLET | Freq: Every day | ORAL | Status: DC

## 2013-02-23 MED ORDER — PREDNISONE 10 MG PO TABS: ORAL_TABLET | ORAL | Status: DC

## 2013-02-23 MED ORDER — WARFARIN SODIUM 5 MG PO TABS
5.0000 mg | ORAL_TABLET | Freq: Once | ORAL | Status: DC
  Filled 2013-02-23: qty 1

## 2013-02-23 NOTE — Progress Notes (Signed)
ANTICOAGULATION CONSULT NOTE - Follow Up  Pharmacy Consult for Warfarin Indication: History of PE/DVT  Allergies  Allergen Reactions  . Sulfamethoxazole Other (See Comments)    REACTION: "washes" him out per pt    Patient Measurements: Height: 5\' 10"  (177.8 cm) Weight: 194 lb 14.2 oz (88.4 kg) IBW/kg (Calculated) : 73  Labs:  Recent Labs  02/20/13 1702 02/21/13 0435 02/22/13 0432 02/23/13 0452  HGB 14.5  --   --   --   HCT 45.8  --   --   --   PLT 301  --   --   --   APTT 38*  --   --   --   LABPROT 20.1* 20.2* 22.6* 24.6*  INR 1.78* 1.79* 2.09* 2.34*  CREATININE 1.08 1.37* 1.30 1.26  TROPONINI <0.30  --   --   --     Estimated Creatinine Clearance: 48.9 ml/min (by C-G formula based on Cr of 1.26).   Medications:  Scheduled:  . furosemide  40 mg Oral BID  . guaiFENesin  1,200 mg Oral BID  . losartan  50 mg Oral Daily  . mometasone-formoterol  2 puff Inhalation BID  . predniSONE  30 mg Oral Q breakfast  . psyllium  1 packet Oral Daily  . simvastatin  40 mg Oral QHS  . tiotropium  18 mcg Inhalation Daily  . Warfarin - Pharmacist Dosing Inpatient   Does not apply q1800    Assessment: 79 yo M with COPD on home O2 admitted 02/20/13 with progressive dyspnea and hypoxia. On chronic warfarin for history of PE/DVT in Dec 2013- home dose 5mg  daily except 7.5mg  on Mondays. INR on admit was 1.78 on 5/27.  INR therapeutic  No bleeding/complications reported.  No drug interactions identified  Goal of Therapy:  INR 2-3   Plan:  1) Will resume home regimen as above - 5mg  today 2) Daily INR   Hessie Knows, PharmD, BCPS Pager [phone] 02/23/2013 12:20 PM

## 2013-02-23 NOTE — Progress Notes (Signed)
Discharge to home [facility] ASF. D/c instructions and follow up appointment done and given to the patient, verbalized understanding. WIfe and son with patient upon discharged.

## 2013-02-27 ENCOUNTER — Ambulatory Visit (INDEPENDENT_AMBULATORY_CARE_PROVIDER_SITE_OTHER): Payer: Medicare Other | Admitting: General Practice

## 2013-02-27 DIAGNOSIS — I2699 Other pulmonary embolism without acute cor pulmonale: Secondary | ICD-10-CM

## 2013-03-02 ENCOUNTER — Inpatient Hospital Stay: Payer: Medicare Other | Admitting: Adult Health

## 2013-03-06 ENCOUNTER — Encounter: Payer: Medicare Other | Admitting: Vascular Surgery

## 2013-03-08 ENCOUNTER — Encounter: Payer: Medicare Other | Admitting: Vascular Surgery

## 2013-03-13 ENCOUNTER — Telehealth: Payer: Self-pay | Admitting: Hematology & Oncology

## 2013-03-13 ENCOUNTER — Encounter: Payer: Self-pay | Admitting: Adult Health

## 2013-03-13 ENCOUNTER — Ambulatory Visit (INDEPENDENT_AMBULATORY_CARE_PROVIDER_SITE_OTHER): Payer: Medicare Other | Admitting: Adult Health

## 2013-03-13 VITALS — BP 124/66 | HR 64 | Temp 97.8°F | Ht 70.0 in | Wt 195.0 lb

## 2013-03-13 DIAGNOSIS — G4733 Obstructive sleep apnea (adult) (pediatric): Secondary | ICD-10-CM

## 2013-03-13 DIAGNOSIS — J9601 Acute respiratory failure with hypoxia: Secondary | ICD-10-CM

## 2013-03-13 DIAGNOSIS — I10 Essential (primary) hypertension: Secondary | ICD-10-CM

## 2013-03-13 DIAGNOSIS — J9600 Acute respiratory failure, unspecified whether with hypoxia or hypercapnia: Secondary | ICD-10-CM

## 2013-03-13 DIAGNOSIS — I2609 Other pulmonary embolism with acute cor pulmonale: Secondary | ICD-10-CM

## 2013-03-13 DIAGNOSIS — Z23 Encounter for immunization: Secondary | ICD-10-CM

## 2013-03-13 DIAGNOSIS — I2699 Other pulmonary embolism without acute cor pulmonale: Secondary | ICD-10-CM

## 2013-03-13 DIAGNOSIS — R911 Solitary pulmonary nodule: Secondary | ICD-10-CM

## 2013-03-13 NOTE — Telephone Encounter (Signed)
Patient walked in office and resch 02/22/13 missed apt for 03/19/13

## 2013-03-13 NOTE — Assessment & Plan Note (Signed)
Cont on current regimen Last INR therapeutic w/ no active bleeding noted.  Last HGB stable , follows with hematology for polycythemia

## 2013-03-13 NOTE — Assessment & Plan Note (Signed)
follow up for CT chest next week as planned

## 2013-03-13 NOTE — Addendum Note (Signed)
Addended by: Tommie Sams on: 03/13/2013 10:11 AM   Modules accepted: Orders

## 2013-03-13 NOTE — Assessment & Plan Note (Signed)
Cont CPAP At bedtime

## 2013-03-13 NOTE — Assessment & Plan Note (Addendum)
Resolved with adequate sats on Oximizer Advised to use Oximizer at all times.  Walk in office showed pt that he does desats with lower flow O2 O2 setting as follows 8l/m with act /qhs and 5 l/m at rest w/ oximizer Pt education on hypoxia given

## 2013-03-13 NOTE — Progress Notes (Signed)
Subjective:    Patient ID: Tracy [name], male    DOB: 22-Nov-1925, 79 y.o.   MRN: [id number]  HPI   PCP- Hodgin   79 y/o WM for FU of OSA; COPD & new pulmonary nodule noted dec'13  OBSTRUCTIVE SLEEP APNEA - ~ sleep study 3/06 w/ RDI 26 w/ desat to 76%...  COPD - on home O2 .  ~ PFT 11/04 showed FVC=4.51 (103%), FEV1=1.93 (67%) and DLCO~50%...  ~ CT Chest 9/05 & CT abd 1/12 showed marked emphysema w/ blebs, & thor spondylosis...  ~ Office spirometry 11/05 showed FEV1=1.57 (48%)...  ~ PFT 4/10 showed FVC= 3.00 (77%), FEV1= 1.37 (45%)  ~02/21/14 Echo with Bubble study >> mild LVH, EF 60 to 65%, mild AS, PAS 51 mmHg, no PFO  ~Oximizer added 02/23/13 for desats >5 /rest , 8l/m act/qhs  11/29/2011 Acute OV >> improved with avelox + prednisone taper   09/26/12  Presented with worsening hypoxia  Ct angio showed Subsegmental acute pulmonary thromboembolism in the right lower lobe, 7 mm spiculated lung lesion in the right middle lobe & bullous emphysema  DUplex rt popliteal DVt noted  PFTs -12/13- fev1 52% (1.30 ) -unchanged from last  DLCO 29%  ONO on cpap + 3L O2 showed drop in satn x 2h    12/05/2012  Breathing is unchanged. Has been adjusting his O2 when he needs to. Denies chest pain, chest tightness, SOB or coughing.  plavix was stopped when coumadin started, INR in range although last was 1.9 - being managed by [facility]  91% on 4L [place] - is able to tolerate pulse o2 at rest but continues to desaturate on 4L continuous on wlaking   02/20/13  Did not feel right, increasing dyspnea On arrival satn 70s required 10 L O2 to bring him upto 93%, uses 6L at home at baseline No cough, wheeze Ruled out DVT 2 weeks ago  but pain and swelling persist in right lower extremity. Unresponsive to trial of  Lasix 20 mg x 5ds. referred to vascular surgery to evaluate for PAD and blockages >>ADMITTED   03/13/2013 Post Hospital follow up  Returns for a post hospital follow up .  Admitted  5/27-5/30 for AECOPD/decompensated cor pulmonale.  tx w/ IV lasix,  steroids, nebs.  O2 adjusted w/ Oximizer for persistent desats. 8 w/ act/sleep and 5L at rest.  2 D echo w/ bubble study neg for PFO , mild LVH, nml EF. PAS 51 CXR w/ chronic changes.  Discharged on Lasix 40mg  and steroid taper pack.  Since discharge he says his breathing is doing much better. Only coughs when he makes himself. Denies any wheezing. Denies chest tightness and no CP. Wears his O2 4-6 liters. Occasionally will bump up to 8 liters if he feels like he needs it.  ON arrival to office today sats 94% on 5L/M O2. Did not wear Oximizer to office.  Walking test in office shows desats w/ O2 <8l/ min  He was encouraged to use O2 at 8 l/m w /oximizer w/ act.  INR 2.5 (6/3) f/by [facility] .  Has upcoming CT chest next week to follow 7 mm nodule found on CT 08/2012 .  Hx of previous Left nephrectomy.  No chest pain, orthopnea, edema or fever.  "best he has felt in long time, loved his time at the hospital"    Past Medical History  Diagnosis Date  . Chronic airway obstruction, not elsewhere classified   . Unspecified essential hypertension   .  Cerebrovascular disease, unspecified   . Other and unspecified hyperlipidemia   . Diaphragmatic hernia without mention of obstruction or gangrene   . Diverticulosis of colon (without mention of hemorrhage)   . Benign neoplasm of colon   . Overweight(278.02)   . OSA on CPAP   . On home oxygen therapy   . Right leg pain 01/18/2013  . H/O tobacco use, presenting hazards to health 01/25/2013     Review of Systems  neg for any significant sore throat, dysphagia, itching, sneezing, nasal congestion or excess/ purulent secretions, fever, chills, sweats, unintended wt loss, pleuritic or exertional cp, hempoptysis, orthopnea pnd or change in chronic leg swelling. Also denies presyncope, palpitations, heartburn, abdominal pain, nausea, vomiting, diarrhea or change in bowel or urinary habits,  dysuria,hematuria, rash, arthralgias, visual complaints, headache, numbness weakness or ataxia.     Objective:   Physical Exam   Gen. Pleasant, overweight, elderly, in no distress, normal affect ENT - no lesions, no post nasal drip Neck: No JVD, no thyromegaly, no carotid bruits Lungs: no use of accessory muscles, no dullness to percussion, decreaesd BL without rales or rhonchi  Cardiovascular: Rhythm regular, heart sounds  normal, no murmurs, none-tr edema Abdomen: soft and non-tender, no hepatosplenomegaly, BS normal. Musculoskeletal: No deformities, no cyanosis or clubbing Neuro:  alert, non focal Skin:  Warm, no lesions/ rash       Assessment & Plan:

## 2013-03-13 NOTE — Patient Instructions (Addendum)
Pneumovax today.  Follow up for CT next week as planned.  I will call with lab results.  Wear Oximizer at all times.  Wear Oxygen 5 l/m at rest , 8l/m with walking and sleep.  Wear CPAP each night to sleep.  follow up Dr. Vassie Loll  In 4-6 weeks at [facility] office.  Please contact office for sooner follow up if symptoms do not improve or worsen or seek emergency care.

## 2013-03-13 NOTE — Assessment & Plan Note (Signed)
Resolved -appears euvolemic  Will cont current regimen w/ lasix.  Previously unresponsive to Lasix 20mg  prior to admission.  Has hx of nephrectomy , currently on ARB and Lasix  Check bmet today to follow renal fxn closely.

## 2013-03-19 ENCOUNTER — Ambulatory Visit (HOSPITAL_BASED_OUTPATIENT_CLINIC_OR_DEPARTMENT_OTHER): Payer: Medicare Other

## 2013-03-19 ENCOUNTER — Ambulatory Visit (HOSPITAL_BASED_OUTPATIENT_CLINIC_OR_DEPARTMENT_OTHER)
Admission: RE | Admit: 2013-03-19 | Discharge: 2013-03-19 | Disposition: A | Discharge status: Home / Self Care | Payer: Medicare Other | Source: Ambulatory Visit | Attending: Pulmonary Disease | Admitting: Pulmonary Disease

## 2013-03-19 ENCOUNTER — Other Ambulatory Visit (HOSPITAL_BASED_OUTPATIENT_CLINIC_OR_DEPARTMENT_OTHER): Payer: Medicare Other | Admitting: Lab

## 2013-03-19 ENCOUNTER — Ambulatory Visit (HOSPITAL_BASED_OUTPATIENT_CLINIC_OR_DEPARTMENT_OTHER): Payer: Medicare Other | Admitting: Medical

## 2013-03-19 ENCOUNTER — Other Ambulatory Visit: Payer: Medicare Other

## 2013-03-19 VITALS — BP 106/65 | HR 89 | Temp 98.2°F | Resp 20 | Ht 70.0 in | Wt 199.0 lb

## 2013-03-19 DIAGNOSIS — D751 Secondary polycythemia: Secondary | ICD-10-CM

## 2013-03-19 DIAGNOSIS — Z09 Encounter for follow-up examination after completed treatment for conditions other than malignant neoplasm: Secondary | ICD-10-CM | POA: Insufficient documentation

## 2013-03-19 DIAGNOSIS — D45 Polycythemia vera: Secondary | ICD-10-CM

## 2013-03-19 DIAGNOSIS — R911 Solitary pulmonary nodule: Secondary | ICD-10-CM | POA: Insufficient documentation

## 2013-03-19 LAB — CBC WITH DIFFERENTIAL (CANCER CENTER ONLY)
EOS%: 3.8 % (ref 0.0–7.0)
HGB: 15.2 g/dL (ref 13.0–17.1)
LYMPH#: 1.2 10*3/uL (ref 0.9–3.3)
MCH: 29 pg (ref 28.0–33.4)
MCHC: 32.1 g/dL (ref 32.0–35.9)
MONO%: 11.9 % (ref 0.0–13.0)
NEUT#: 6 10*3/uL (ref 1.5–6.5)
Platelets: 176 10*3/uL (ref 145–400)
RBC: 5.24 10*6/uL (ref 4.20–5.70)

## 2013-03-19 LAB — IRON AND TIBC
Iron: 79 ug/dL (ref 42–165)
UIBC: 268 ug/dL (ref 125–400)

## 2013-03-19 LAB — FERRITIN: Ferritin: 52 ng/mL (ref 22–322)

## 2013-03-19 NOTE — Patient Instructions (Signed)
Therapeutic Phlebotomy Therapeutic phlebotomy is the controlled removal of blood from your body for the purpose of treating a medical condition. It is similar to donating blood. Usually, about a pint (470 mL) of blood is removed. The average adult has 9 to 12 pints (4.3 to 5.7 L) of blood. Therapeutic phlebotomy may be used to treat the following medical conditions:  Hemochromatosis. This is a condition in which there is too much iron in the blood.  Polycythemia vera. This is a condition in which there are too many red cells in the blood.  Porphyria cutanea tarda. This is a disease usually passed from one generation to the next (inherited). It is a condition in which an important part of hemoglobin is not made properly. This results in the build up of abnormal amounts of porphyrins in the body.  Sickle cell disease. This is an inherited disease. It is a condition in which the red blood cells form an abnormal crescent shape rather than a round shape. LET YOUR CAREGIVER KNOW ABOUT:  Allergies.  Medicines taken including herbs, eyedrops, over-the-counter medicines, and creams.  Use of steroids (by mouth or creams).  Previous problems with anesthetics or numbing medicine.  History of blood clots.  History of bleeding or blood problems.  Previous surgery.  Possibility of pregnancy, if this applies. RISKS AND COMPLICATIONS This is a simple and safe procedure. Problems are unlikely. However, problems can occur and may include:  Nausea or lightheadedness.  Low blood pressure.  Soreness, bleeding, swelling, or bruising at the needle insertion site.  Infection. BEFORE THE PROCEDURE  This is a procedure that can be done as an outpatient. Confirm the time that you need to arrive for your procedure. Confirm whether there is a need to fast or withhold any medications. It is helpful to wear clothing with sleeves that can be raised above the elbow. A blood sample may be done to determine the  amount of red blood cells or iron in your blood. Plan ahead of time to have someone drive you home after the procedure. PROCEDURE The entire procedure from preparation through recovery takes about 1 hour. The actual collection takes about 10 to 15 minutes.  A needle will be inserted into your vein.  Tubing and a collection bag will be attached to that needle.  Blood will flow through the needle and tubing into the collection bag.  You may be asked to open and close your hand slowly and continuously during the entire collection.  Once the specified amount of blood has been removed from your body, the collection bag and tubing will be clamped.  The needle will be removed.  Pressure will be held on the site of the needle insertion to stop the bleeding. Then a bandage will be placed over the needle insertion site. AFTER THE PROCEDURE  Your recovery will be assessed and monitored. If there are no problems, as an outpatient, you should be able to go home shortly after the procedure.  Document Released: 02/15/2011 Document Revised: 12/06/2011 Document Reviewed: 02/15/2011 Exit[employer] Patient Information 2014 [employer].  

## 2013-03-19 NOTE — Progress Notes (Signed)
Tracy [name] presents today for phlebotomy per MD orders. Phlebotomy procedure started at 1450 and ended at 1455 per Alvino Chapel, RN. 500 grams removed. Patient observed for 30 minutes after procedure without any incident. Patient tolerated procedure well. IV needle removed intact.

## 2013-03-19 NOTE — Progress Notes (Signed)
Diagnoses: #1.  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 #2.  Pulmonary embolism/deep venous thrombosis.  Current therapy: #1.  Phlebotomy to maintain hematocrit below 45%. #2.  Coumadin 5 mg by mouth daily.  Interim history: Tracy [name] presents today for an office followup visit.  Overall, he, reports, that he is doing quite well.  He does have to wear continuous oxygen for his COPD. Marland Kitchen  He remains on Coumadin for his DVT/pulmonary embolism.  [name] for the first time, his hemoglobin was close to 20, and hematocrit 57.  We did phlebotomize  him on a weekly basis.  The phlebotomies have significantly improved hs blood counts.  We did send off a JAK 2 on him which was negative.  His recent ferritin back in April was 52, and his iron was 78, with 24% saturation  He, otherwise does not report any new complaints.  He has a good appetite.  He denies any nausea, vomiting, diarrhea, constipation.  He denies any fevers, chills, or night sweats.  He denies any cough, chest pain, shortness of breath, other than his COPD. Marland Kitchen  He denies any obvious, bleeding.  He denies any headaches, visual changes, or rashes.  Of note, his hematocrit is 47.4 today, as, such, we will go ahead and phlebotomize him.  Review of Systems: Constitutional:Negative for malaise/fatigue, fever, chills, weight loss, diaphoresis, activity change, appetite change, and unexpected weight change.  HEENT: Negative for double vision, blurred vision, visual loss, ear pain, tinnitus, congestion, rhinorrhea, epistaxis sore throat or sinus disease, oral pain/lesion, tongue soreness Respiratory: Negative for cough, chest tightness, shortness of breath, wheezing and stridor.  Cardiovascular: Negative for chest pain, palpitations, leg swelling, orthopnea, PND, DOE or claudication Gastrointestinal: Negative for nausea, vomiting, abdominal pain, diarrhea, constipation, blood in stool, melena, hematochezia, abdominal distention, anal  bleeding, rectal pain, anorexia and hematemesis.  Genitourinary: Negative for dysuria, frequency, hematuria,  Musculoskeletal: Negative for myalgias, back pain, joint swelling, arthralgias and gait problem.  Skin: Negative for rash, color change, pallor and wound.  Neurological:. Negative for dizziness/light-headedness, tremors, seizures, syncope, facial asymmetry, speech difficulty, weakness, numbness, headaches and paresthesias.  Hematological: Negative for adenopathy. Does not bruise/bleed easily.  Psychiatric/Behavioral:  Negative for depression, no loss of interest in normal activity or change in sleep pattern.   Physical Exam: This is a pleasant, 79 year old, well-developed, well-nourished, white gentleman, in no obvious distress Vitals:  Temperature 98.2 degrees pulse 89 respirations 20 blood pressure 106/65 weight 199 pounds HEENT reveals a normocephalic, atraumatic skull, no scleral icterus, no oral lesions  Neck is supple without any cervical or supraclavicular adenopathy.  Lungs are clear to auscultation bilaterally. There are no wheezes, rales or rhonci Cardiac is regular rate and rhythm with a normal S1 and S2. There are no murmurs, rubs, or bruits.  Abdomen is soft with good bowel sounds, there is no palpable mass. There is no palpable hepatosplenomegaly. There is no palpable fluid wave.  Musculoskeletal no tenderness of the spine, ribs, or hips.  Extremities there are no clubbing, cyanosis, or edema.  Skin no petechia, purpura or ecchymosis Neurologic is nonfocal.  Laboratory Data: White count 8.6 hemoglobin 15.2 hematocrit 47.4 platelets 176,000  Current Outpatient Prescriptions on File Prior to Visit  Medication Sig Dispense Refill  . acetaminophen (TYLENOL ARTHRITIS PAIN) 650 MG CR tablet Take 650 mg by mouth as needed.       . fish oil-omega-3 fatty acids 1000 MG capsule Take 1 g by mouth daily.       Marland Kitchen  Fluticasone-Salmeterol (ADVAIR DISKUS) 250-50 MCG/DOSE AEPB Inhale 1  puff into the lungs every 12 (twelve) hours.  60 each  11  . guaiFENesin (MUCINEX) 600 MG 12 hr tablet Take 1,200 mg by mouth 2 (two) times daily.       Marland Kitchen losartan (COZAAR) 50 MG tablet Take 1 tablet (50 mg total) by mouth daily.  30 tablet  6  . simvastatin (ZOCOR) 40 MG tablet Take 1 tablet (40 mg total) by mouth at bedtime.  30 tablet  11  . tiotropium (SPIRIVA) 18 MCG inhalation capsule Place 1 capsule (18 mcg total) into inhaler and inhale daily.  30 capsule  11  . warfarin (COUMADIN) 5 MG tablet Take 1 tablet (5 mg total) by mouth one time only at 6 PM.  5 tablet  1   No current facility-administered medications on file prior to visit.   Assessment/Plan: This is a pleasant, 79 year old, white gentleman, with the following issues:  #1.  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  We will go ahead and phlebotomize him again today.  His hematocrit is still above 45%.   #2.  DVT/pulmonary embolism.  He is on Coumadin.  He is probably going to need one year of anticoagulation.  #3.  Followup.  We'll follow back up with Tracy [name] in one month, but before then should there be questions or concerns.

## 2013-03-21 ENCOUNTER — Telehealth: Payer: Self-pay | Admitting: Pulmonary Disease

## 2013-03-21 NOTE — Telephone Encounter (Signed)
I spoke with pt and he stated the right leg/ankle is retaining fluid. Not red not warm to touch. Slight sore in one spot. He is taking lasix 40 mg QD. He is trying to keep leg elevated when possible. The left leg is not swollen. His weight is up to about 196 from 191. He feels fine other than the swelling in left leg. Any other recs. Please advise Dr. Vassie Loll thanks  Last OV 03/13/13 w/ TP  Allergies  Allergen Reactions  . Sulfamethoxazole Other (See Comments)    REACTION: "washes" him out per pt

## 2013-03-21 NOTE — Telephone Encounter (Signed)
I spoke with pt and is aware of RA recs. He voiced his understanding and needed nothing further

## 2013-03-21 NOTE — Telephone Encounter (Signed)
Increase lasix twice daily x 3ds -second dose by 3 pm -

## 2013-03-27 ENCOUNTER — Encounter: Payer: Self-pay | Admitting: Adult Health

## 2013-03-28 ENCOUNTER — Ambulatory Visit (INDEPENDENT_AMBULATORY_CARE_PROVIDER_SITE_OTHER): Payer: Medicare Other | Admitting: General Practice

## 2013-03-28 DIAGNOSIS — I2699 Other pulmonary embolism without acute cor pulmonale: Secondary | ICD-10-CM

## 2013-04-02 ENCOUNTER — Other Ambulatory Visit: Payer: Self-pay | Admitting: Pulmonary Disease

## 2013-04-02 ENCOUNTER — Telehealth: Payer: Self-pay | Admitting: Pulmonary Disease

## 2013-04-02 MED ORDER — FUROSEMIDE 40 MG PO TABS: ORAL_TABLET | ORAL | Status: DC

## 2013-04-02 NOTE — Telephone Encounter (Signed)
Ok to increase lasix

## 2013-04-02 NOTE — Telephone Encounter (Signed)
Pt gets this filled through coumadin clinic. I advised him of this. He will call them. He stated also he is wanting to increase his lasix an extra 1/2 daily in the afternoon. He stated his weight was doing fine while he was taking the extra lasix daily x 3 days (phone note 03/21/13). He stated once he went back to once a daily he noticed his weight to slightly increase again. Please advise Dr. Vassie Loll thanks  Allergies  Allergen Reactions  . Sulfamethoxazole Other (See Comments)    REACTION: "washes" him out per pt

## 2013-04-02 NOTE — Telephone Encounter (Signed)
I spoke with spouse and is aware of recs. RX sent. Nothing further was needed

## 2013-04-04 ENCOUNTER — Other Ambulatory Visit: Payer: Self-pay | Admitting: General Practice

## 2013-04-04 MED ORDER — WARFARIN SODIUM 5 MG PO TABS: ORAL_TABLET | ORAL | Status: DC

## 2013-04-04 NOTE — Telephone Encounter (Signed)
Dr. Vassie Loll please advise if okay to refill thanks   --I have called the pharmacy and made them aware to send to [facility] but they are still faxing over to [place] and this has not been refilled yet by [facility] per [facility]. thanks

## 2013-04-10 ENCOUNTER — Encounter: Payer: Self-pay | Admitting: Pulmonary Disease

## 2013-04-10 ENCOUNTER — Ambulatory Visit (INDEPENDENT_AMBULATORY_CARE_PROVIDER_SITE_OTHER): Payer: Medicare Other | Admitting: Pulmonary Disease

## 2013-04-10 VITALS — BP 122/72 | HR 74 | Temp 98.1°F | Ht 70.0 in | Wt 201.0 lb

## 2013-04-10 DIAGNOSIS — R911 Solitary pulmonary nodule: Secondary | ICD-10-CM

## 2013-04-10 DIAGNOSIS — I2699 Other pulmonary embolism without acute cor pulmonale: Secondary | ICD-10-CM

## 2013-04-10 DIAGNOSIS — J9601 Acute respiratory failure with hypoxia: Secondary | ICD-10-CM

## 2013-04-10 DIAGNOSIS — J9600 Acute respiratory failure, unspecified whether with hypoxia or hypercapnia: Secondary | ICD-10-CM

## 2013-04-10 NOTE — Progress Notes (Signed)
Subjective:    Patient ID: Tracy [name], male    DOB: 23-Nov-1925, 79 y.o.   MRN: [id number]  HPI PCP- Hodgin   79 y/o WM for FU of OSA; COPD, severe hypoxia  & new pulmonary nodule noted dec'13  H/o sub segmental PE/ RLE DVT  in dec 13  OBSTRUCTIVE SLEEP APNEA - ~ sleep study 3/06 w/ RDI 26 w/ desat to 76%...  COPD - on home O2 .  ~ PFT 11/04 showed FVC=4.51 (103%), FEV1=1.93 (67%) and DLCO~50%...  ~ CT Chest 9/05 & CT abd 1/12 showed marked emphysema w/ blebs, & thor spondylosis...  ~ Office spirometry 11/05 showed FEV1=1.57 (48%)...  ~ PFT 4/10 showed FVC= 3.00 (77%), FEV1= 1.37 (45%)  ~02/21/14 Echo with Bubble study >> mild LVH, EF 60 to 65%, mild AS, PAS 51 mmHg, no PFO  ~Oximizer added 02/23/13 for desats >5 /rest , 8l/m act/qhs    Hospitalised 09/26/12 for worsening hypoxia  Ct angio showed Subsegmental acute pulmonary thromboembolism in the right lower lobe, 7 mm spiculated lung lesion in the right middle lobe & bullous emphysema  DUplex rt popliteal DVt noted  PFTs -12/13- fev1 52% (1.30 ) -unchanged from last  DLCO 29%  ONO on cpap + 3L O2 showed drop in satn x 2h    04/10/2013  Admitted 5/27-5/30/14  for AECOPD/decompensated cor pulmonale.  tx w/ IV lasix, steroids, nebs.  O2 adjusted w/ Oximizer for persistent desats. 8 w/ act/sleep and 5L at rest.  2 D echo w/ bubble study neg for PFO , mild LVH, nml EF. PAS 51  Discharged on Lasix 40mg  and steroid taper pack.  >>O2 at 8 l/m w /oximizer w/ act.  "best he has felt in long time, loved his time at the hospital"    03/21/13 Increase lasix twice daily x 3ds -second dose by 3 pm -  He stated once he went back to once a daily he noticed his weight to slightly increase again.  201 today, 195 last visit polycythemic -  On phlebotomy CT chest 02/2013 shows decrease in size of nodule from 7mm to 5mm Does not like oximiser much since heavy   Past Medical History  Diagnosis Date  . Chronic airway obstruction, not  elsewhere classified   . Unspecified essential hypertension   . Cerebrovascular disease, unspecified   . Other and unspecified hyperlipidemia   . Diaphragmatic hernia without mention of obstruction or gangrene   . Diverticulosis of colon (without mention of hemorrhage)   . Benign neoplasm of colon   . Overweight(278.02)   . OSA on CPAP   . On home oxygen therapy   . Right leg pain 01/18/2013  . H/O tobacco use, presenting hazards to health 01/25/2013      Review of Systems neg for any significant sore throat, dysphagia, itching, sneezing, nasal congestion or excess/ purulent secretions, fever, chills, sweats, unintended wt loss, pleuritic or exertional cp, hempoptysis, orthopnea pnd or change in chronic leg swelling. Also denies presyncope, palpitations, heartburn, abdominal pain, nausea, vomiting, diarrhea or change in bowel or urinary habits, dysuria,hematuria, rash, arthralgias, visual complaints, headache, numbness weakness or ataxia.     Objective:   Physical Exam  Gen. Pleasant, well-nourished, in no distress, normal affect ENT - no lesions, no post nasal drip Neck: No JVD, no thyromegaly, no carotid bruits Lungs: no use of accessory muscles, no dullness to percussion, decreased without rales or rhonchi  Cardiovascular: Rhythm regular, heart sounds  normal, no murmurs or gallops, no  peripheral edema Abdomen: soft and non-tender, no hepatosplenomegaly, BS normal. Musculoskeletal: No deformities, no cyanosis or clubbing Neuro:  alert, non focal       Assessment & Plan:

## 2013-04-10 NOTE — Telephone Encounter (Signed)
Pl send to coumadin clinic

## 2013-04-10 NOTE — Telephone Encounter (Signed)
This as already been refilled.

## 2013-04-10 NOTE — Patient Instructions (Addendum)
Stay on current dose of lasix - based on weight Doppler test of legs to r/o clot If no clot, can stop coumadin & get back on plavix Check kidney function on next blood draw (BMET)

## 2013-04-16 NOTE — Assessment & Plan Note (Signed)
Doppler test of legs to r/o clot If no clot, can stop coumadin & get back on plavix

## 2013-04-16 NOTE — Assessment & Plan Note (Signed)
Since drop in size, doubt further imaging needed

## 2013-04-16 NOTE — Assessment & Plan Note (Signed)
Stay on current dose of lasix - based on weight Check kidney function on next blood draw (BMET)

## 2013-04-17 ENCOUNTER — Ambulatory Visit (INDEPENDENT_AMBULATORY_CARE_PROVIDER_SITE_OTHER): Payer: Medicare Other | Admitting: Family Medicine

## 2013-04-17 ENCOUNTER — Other Ambulatory Visit: Payer: Self-pay | Admitting: Pulmonary Disease

## 2013-04-17 ENCOUNTER — Encounter: Payer: Self-pay | Admitting: Family Medicine

## 2013-04-17 VITALS — BP 138/76 | HR 71 | Temp 98.0°F | Ht 70.0 in | Wt 202.0 lb

## 2013-04-17 DIAGNOSIS — R609 Edema, unspecified: Secondary | ICD-10-CM

## 2013-04-17 DIAGNOSIS — I2699 Other pulmonary embolism without acute cor pulmonale: Secondary | ICD-10-CM

## 2013-04-17 DIAGNOSIS — J4489 Other specified chronic obstructive pulmonary disease: Secondary | ICD-10-CM

## 2013-04-17 DIAGNOSIS — J449 Chronic obstructive pulmonary disease, unspecified: Secondary | ICD-10-CM

## 2013-04-17 DIAGNOSIS — I10 Essential (primary) hypertension: Secondary | ICD-10-CM

## 2013-04-17 DIAGNOSIS — J9601 Acute respiratory failure with hypoxia: Secondary | ICD-10-CM

## 2013-04-17 DIAGNOSIS — J9600 Acute respiratory failure, unspecified whether with hypoxia or hypercapnia: Secondary | ICD-10-CM

## 2013-04-17 DIAGNOSIS — E785 Hyperlipidemia, unspecified: Secondary | ICD-10-CM

## 2013-04-17 DIAGNOSIS — E663 Overweight: Secondary | ICD-10-CM

## 2013-04-17 NOTE — Patient Instructions (Addendum)
DASH Diet  The DASH diet stands for "Dietary Approaches to Stop Hypertension." It is a healthy eating plan that has been shown to reduce high blood pressure (hypertension) in as little as 14 days, while also possibly providing other significant health benefits. These other health benefits include reducing the risk of breast cancer after menopause and reducing the risk of type 2 diabetes, heart disease, colon cancer, and stroke. Health benefits also include weight loss and slowing kidney failure in patients with chronic kidney disease.   DIET GUIDELINES  · Limit salt (sodium). Your diet should contain less than 1500 mg of sodium daily.  · Limit refined or processed carbohydrates. Your diet should include mostly whole grains. Desserts and added sugars should be used sparingly.  · Include small amounts of heart-healthy fats. These types of fats include nuts, oils, and tub margarine. Limit saturated and trans fats. These fats have been shown to be harmful in the body.  CHOOSING FOODS   The following food groups are based on a 2000 calorie diet. See your Registered Dietitian for individual calorie needs.  Grains and Grain Products (6 to 8 servings daily)  · Eat More Often: Whole-wheat bread, brown rice, whole-grain or wheat pasta, quinoa, popcorn without added fat or salt (air popped).  · Eat Less Often: White bread, white pasta, white rice, cornbread.  Vegetables (4 to 5 servings daily)  · Eat More Often: Fresh, frozen, and canned vegetables. Vegetables may be raw, steamed, roasted, or grilled with a minimal amount of fat.  · Eat Less Often/Avoid: Creamed or fried vegetables. Vegetables in a cheese sauce.  Fruit (4 to 5 servings daily)  · Eat More Often: All fresh, canned (in natural juice), or frozen fruits. Dried fruits without added sugar. One hundred percent fruit juice (½ cup [237 mL] daily).  · Eat Less Often: Dried fruits with added sugar. Canned fruit in light or heavy syrup.  Lean Meats, Fish, and Poultry (2  servings or less daily. One serving is 3 to 4 oz [85-114 g]).  · Eat More Often: Ninety percent or leaner ground beef, tenderloin, sirloin. Round cuts of beef, chicken breast, turkey breast. All fish. Grill, bake, or broil your meat. Nothing should be fried.  · Eat Less Often/Avoid: Fatty cuts of meat, turkey, or chicken leg, thigh, or wing. Fried cuts of meat or fish.  Dairy (2 to 3 servings)  · Eat More Often: Low-fat or fat-free milk, low-fat plain or light yogurt, reduced-fat or part-skim cheese.  · Eat Less Often/Avoid: Milk (whole, 2%). Whole milk yogurt. Full-fat cheeses.  Nuts, Seeds, and Legumes (4 to 5 servings per week)  · Eat More Often: All without added salt.  · Eat Less Often/Avoid: Salted nuts and seeds, canned beans with added salt.  Fats and Sweets (limited)  · Eat More Often: Vegetable oils, tub margarines without trans fats, sugar-free gelatin. Mayonnaise and salad dressings.  · Eat Less Often/Avoid: Coconut oils, palm oils, butter, stick margarine, cream, half and half, cookies, candy, pie.  FOR MORE INFORMATION  The Dash Diet Eating Plan: www.dashdiet.org  Document Released: 09/02/2011 Document Revised: 12/06/2011 Document Reviewed: 09/02/2011  ExitCare® Patient Information ©2014 [employer].

## 2013-04-18 ENCOUNTER — Encounter: Payer: Self-pay | Admitting: Family Medicine

## 2013-04-18 DIAGNOSIS — R609 Edema, unspecified: Secondary | ICD-10-CM | POA: Insufficient documentation

## 2013-04-18 HISTORY — DX: Edema, unspecified: R60.9

## 2013-04-18 NOTE — Assessment & Plan Note (Signed)
Consider DASH diet  

## 2013-04-18 NOTE — Assessment & Plan Note (Deleted)
Doing well on O2  

## 2013-04-18 NOTE — Assessment & Plan Note (Signed)
Tolerating Simvastatin, avoid trans fats.  

## 2013-04-18 NOTE — Assessment & Plan Note (Signed)
Improved after a recent hospitalization where he got IV diuretics. Given rx for Lasix to use prn, avoid sodium, elevate feet

## 2013-04-18 NOTE — Assessment & Plan Note (Signed)
Well controlled, no changes to meds today 

## 2013-04-18 NOTE — Progress Notes (Signed)
Patient ID: Tracy [name], male   DOB: 12/23/1925, 79 y.o.   MRN: [id number] Tracy [name] [id number] [birth year] 04/18/2013      Progress Note-Follow Up  Subjective  Chief Complaint  Chief Complaint  Patient presents with  . Follow-up    HPI  Patient is an 79 year old Caucasian male who struggles with severe lung disease and is on chronic oxygen therapy. He reports feeling well. No change in his baseline dyspnea. No recent illness, fevers. He did may the hospital with increasing edema and shortness of breath and after significant diuresis feels much better. The edema in his legs and if the pain in his legs is greatly improved. He did have a vascular screening through [place] which she brings in today showing no significant lower extremity concerns with ABI testing. His leg pain is better with the decrease edema. No chest pain or palpitations. No GI or GU complaints  Past Medical History  Diagnosis Date  . Chronic airway obstruction, not elsewhere classified   . Unspecified essential hypertension   . Cerebrovascular disease, unspecified   . Other and unspecified hyperlipidemia   . Diaphragmatic hernia without mention of obstruction or gangrene   . Diverticulosis of colon (without mention of hemorrhage)   . Benign neoplasm of colon   . Overweight(278.02)   . OSA on CPAP   . On home oxygen therapy   . Right leg pain 01/18/2013  . H/O tobacco use, presenting hazards to health 01/25/2013    Past Surgical History  Procedure Laterality Date  . Hernia repair    . Nephroureterectomy      History reviewed. No pertinent family history.  History   Social History  . Marital Status: Married    Spouse Name: Barbette Or    Number of Children: 3  . Years of Education: N/A   Occupational History  .     Social History Main Topics  . Smoking status: Former Smoker -- 1.50 packs/day    Types: Cigarettes    Quit date: 09/27/1978  . Smokeless tobacco: Never Used  . Alcohol Use: No  .  Drug Use: No  . Sexually Active: No   Other Topics Concern  . Not on file   Social History Narrative  . No narrative on file    Current Outpatient Prescriptions on File Prior to Visit  Medication Sig Dispense Refill  . acetaminophen (TYLENOL ARTHRITIS PAIN) 650 MG CR tablet Take 650 mg by mouth as needed.       . fish oil-omega-3 fatty acids 1000 MG capsule Take 1 g by mouth daily.       . Fluticasone-Salmeterol (ADVAIR DISKUS) 250-50 MCG/DOSE AEPB Inhale 1 puff into the lungs every 12 (twelve) hours.  60 each  11  . furosemide (LASIX) 40 MG tablet Take 1 tablet in the AM and 1/2 tablet in the evening  45 tablet  6  . guaiFENesin (MUCINEX) 600 MG 12 hr tablet Take 1,200 mg by mouth 2 (two) times daily.       Marland Kitchen losartan (COZAAR) 50 MG tablet Take 1 tablet (50 mg total) by mouth daily.  30 tablet  6  . psyllium (METAMUCIL) 58.6 % packet Take 1 packet by mouth daily.      . simvastatin (ZOCOR) 40 MG tablet Take 1 tablet (40 mg total) by mouth at bedtime.  30 tablet  11  . tiotropium (SPIRIVA) 18 MCG inhalation capsule Place 1 capsule (18 mcg total) into inhaler and inhale daily.  30 capsule  11  . warfarin (COUMADIN) 5 MG tablet Take as directed by coumadin clinic.  35 tablet  2   No current facility-administered medications on file prior to visit.    Allergies  Allergen Reactions  . Sulfamethoxazole Other (See Comments)    REACTION: "washes" him out per pt    Review of Systems  Review of Systems  Constitutional: Negative for fever and malaise/fatigue.  HENT: Negative for congestion.   Eyes: Negative for pain and discharge.  Respiratory: Positive for shortness of breath.   Cardiovascular: Negative for chest pain, palpitations and leg swelling.  Gastrointestinal: Negative for nausea, abdominal pain and diarrhea.  Genitourinary: Negative for dysuria.  Musculoskeletal: Negative for falls.  Skin: Negative for rash.  Neurological: Negative for loss of consciousness and headaches.   Endo/Heme/Allergies: Negative for polydipsia.  Psychiatric/Behavioral: Negative for depression and suicidal ideas. The patient is not nervous/anxious and does not have insomnia.     Objective  BP 138/76  Pulse 71  Temp(Src) 98 F (36.7 C) (Oral)  Ht 5\' 10"  (1.778 m)  Wt 202 lb (91.627 kg)  BMI 28.98 kg/m2  SpO2 96%  Physical Exam  Physical Exam  Constitutional: He is oriented to person, place, and time and well-developed, well-nourished, and in no distress. No distress.  HENT:  Head: Normocephalic and atraumatic.  Eyes: Conjunctivae are normal.  Neck: Neck supple. No thyromegaly present.  Cardiovascular: Normal rate, regular rhythm and normal heart sounds.   No murmur heard. Pulmonary/Chest: Effort normal and breath sounds normal. No respiratory distress.  On [place]  Abdominal: He exhibits no distension and no mass. There is no tenderness.  Musculoskeletal: He exhibits no edema.  Neurological: He is alert and oriented to person, place, and time.  Skin: Skin is warm.  Psychiatric: Memory, affect and judgment normal.    Lab Results  Component Value Date   TSH 2.65 01/11/2011   Lab Results  Component Value Date   WBC 8.6 03/19/2013   HGB 15.2 03/19/2013   HCT 47.4 03/19/2013   MCV 91 03/19/2013   PLT 176 03/19/2013   Lab Results  Component Value Date   CREATININE 1.26 02/23/2013   BUN 41* 02/23/2013   NA 135 02/23/2013   K 3.9 02/23/2013   CL 94* 02/23/2013   CO2 29 02/23/2013   Lab Results  Component Value Date   ALT 20 02/20/2013   AST 21 02/20/2013   ALKPHOS 75 02/20/2013   BILITOT 0.3 02/20/2013   Lab Results  Component Value Date   CHOL 162 10/04/2012   Lab Results  Component Value Date   HDL 48 10/04/2012   Lab Results  Component Value Date   LDLCALC 89 10/04/2012   Lab Results  Component Value Date   TRIG 124 10/04/2012   Lab Results  Component Value Date   CHOLHDL 3.4 10/04/2012     Assessment & Plan  HYPERTENSION, BORDERLINE Well controlled, no changes  to meds today  HYPERLIPIDEMIA Tolerating Simvastatin, avoid trans fats  COPD Doing well on O2  OVERWEIGHT Consider DASH diet

## 2013-04-18 NOTE — Assessment & Plan Note (Signed)
Doing well on O2  

## 2013-04-20 ENCOUNTER — Ambulatory Visit (HOSPITAL_BASED_OUTPATIENT_CLINIC_OR_DEPARTMENT_OTHER): Payer: Medicare Other | Admitting: Hematology & Oncology

## 2013-04-20 ENCOUNTER — Ambulatory Visit: Payer: Medicare Other

## 2013-04-20 ENCOUNTER — Ambulatory Visit (HOSPITAL_BASED_OUTPATIENT_CLINIC_OR_DEPARTMENT_OTHER)
Admission: RE | Admit: 2013-04-20 | Discharge: 2013-04-20 | Disposition: A | Discharge status: Home / Self Care | Payer: Medicare Other | Source: Ambulatory Visit | Attending: Pulmonary Disease | Admitting: Pulmonary Disease

## 2013-04-20 ENCOUNTER — Other Ambulatory Visit (HOSPITAL_BASED_OUTPATIENT_CLINIC_OR_DEPARTMENT_OTHER): Payer: Medicare Other | Admitting: Lab

## 2013-04-20 VITALS — BP 119/71 | HR 60 | Temp 97.8°F | Resp 20 | Ht 70.0 in | Wt 201.0 lb

## 2013-04-20 DIAGNOSIS — D751 Secondary polycythemia: Secondary | ICD-10-CM

## 2013-04-20 DIAGNOSIS — Z86718 Personal history of other venous thrombosis and embolism: Secondary | ICD-10-CM | POA: Insufficient documentation

## 2013-04-20 DIAGNOSIS — D45 Polycythemia vera: Secondary | ICD-10-CM

## 2013-04-20 DIAGNOSIS — I2699 Other pulmonary embolism without acute cor pulmonale: Secondary | ICD-10-CM

## 2013-04-20 LAB — CBC WITH DIFFERENTIAL (CANCER CENTER ONLY)
BASO%: 0.3 % (ref 0.0–2.0)
EOS%: 3 % (ref 0.0–7.0)
LYMPH%: 12.8 % — ABNORMAL LOW (ref 14.0–48.0)
MCHC: 31.6 g/dL — ABNORMAL LOW (ref 32.0–35.9)
MCV: 90 fL (ref 82–98)
MONO#: 1.2 10*3/uL — ABNORMAL HIGH (ref 0.1–0.9)
MONO%: 12.6 % (ref 0.0–13.0)
Platelets: 239 10*3/uL (ref 145–400)
RDW: 16.6 % — ABNORMAL HIGH (ref 11.1–15.7)
WBC: 9.6 10*3/uL (ref 4.0–10.0)

## 2013-04-20 LAB — FERRITIN CHCC: Ferritin: 27 ng/ml (ref 22–316)

## 2013-04-20 LAB — IRON AND TIBC CHCC: Iron: 94 ug/dL (ref 42–163)

## 2013-04-20 NOTE — Progress Notes (Signed)
This office note has been dictated.

## 2013-04-20 NOTE — Progress Notes (Signed)
Per Dr. Myna Hidalgo, pt did not need phlebotomy. HCT less than 45.

## 2013-04-21 NOTE — Progress Notes (Signed)
DIAGNOSIS: 1. Secondary polycythemia. 2. Pulmonary embolism/deep venous thrombosis - idiopathic.  CURRENT THERAPY: 1. Phlebotomy to maintain hematocrit below 45%. 2. Coumadin 5 mg p.o. daily.  INTERIM HISTORY:  Mr. Seals comes in for his followup.  He is doing pretty well.  He has had no specific complaints since we last saw him. His blood counts have been holding down nicely.  He was checked for hypercoagulable state.  He may have a mildly elevated IgM beta-2 glycoprotein.  This is indeterminate in significance.  He is doing well on the Coumadin.  There has been no bleeding with the Coumadin.  He has noted a little bit of leg swelling.  He did have Dopplers of his legs today.  Dopplers did not show any DVT.  PHYSICAL EXAM:  General:  This is a fairly well developed, well- nourished white gentleman in no obvious distress.  He is on chronic oxygen therapy.  Vital Signs:  Show a temperature 97.8, pulse 60, respiratory rate 20, blood pressure 119/71.  Weight is 201.  Head and Neck:  Show a normocephalic, atraumatic skull.  There are no ocular or oral lesions.  There are no palpable cervical or supraclavicular lymph nodes.  Lungs:  Show decreased breath sounds throughout all lung fields. He has some expiratory wheezes.  Cardiac:  Regular rate and rhythm with a normal S1 and S2.  There are no murmurs, rubs, or bruits.  Abdomen: Soft.  He has good bowel sounds.  There is no fluid wave.  There is no palpable hepatosplenomegaly.  Extremities:  Show some trace edema in his legs.  LABORATORY STUDIES:  White cell count is 9.6.  Hemoglobin 13.7, hematocrit 43.4, platelet count 239.  IMPRESSION:  [name] is an 79 year old gentleman with secondary polycythemia.  I think that we are benefiting him by keeping his hematocrit below 45%.  He does not need to be phlebotomized today.  Of note, he did have a CT of the chest done back in June.  He had a decrease in a right middle lobe nodule.   It was recommended that 75-month followup was indicated.  There were severe emphysematous changes.  I think we can probably get him back now in about 2 months or so.  I still feel that he benefits by keeping his hematocrit below 45%.  As far as his anticoagulation goes, I would personally think that a year would be appropriate given that he had a pulmonary embolism.    ______________________________ Josph Macho, M.D. PRE/MEDQ  D:  04/20/2013  T:  04/21/2013  Job:  [id number]

## 2013-04-23 ENCOUNTER — Telehealth: Payer: Self-pay | Admitting: Pulmonary Disease

## 2013-04-23 NOTE — Telephone Encounter (Signed)
Result Note    No DVT      I spoke with patient about results and he verbalized understanding and had no questions.

## 2013-04-25 ENCOUNTER — Telehealth: Payer: Self-pay | Admitting: General Practice

## 2013-04-25 ENCOUNTER — Ambulatory Visit (INDEPENDENT_AMBULATORY_CARE_PROVIDER_SITE_OTHER): Payer: Medicare Other | Admitting: General Practice

## 2013-04-25 DIAGNOSIS — I2699 Other pulmonary embolism without acute cor pulmonale: Secondary | ICD-10-CM

## 2013-04-25 NOTE — Telephone Encounter (Signed)
Faxed dosing instruction for coumadin.

## 2013-05-02 ENCOUNTER — Ambulatory Visit (INDEPENDENT_AMBULATORY_CARE_PROVIDER_SITE_OTHER): Payer: Medicare Other | Admitting: General Practice

## 2013-05-02 DIAGNOSIS — I2699 Other pulmonary embolism without acute cor pulmonale: Secondary | ICD-10-CM

## 2013-05-02 LAB — POCT INR: INR: 1.7

## 2013-05-21 ENCOUNTER — Other Ambulatory Visit: Payer: Self-pay | Admitting: Internal Medicine

## 2013-05-23 ENCOUNTER — Ambulatory Visit (INDEPENDENT_AMBULATORY_CARE_PROVIDER_SITE_OTHER): Payer: Medicare Other | Admitting: General Practice

## 2013-05-23 DIAGNOSIS — I2699 Other pulmonary embolism without acute cor pulmonale: Secondary | ICD-10-CM

## 2013-05-30 ENCOUNTER — Telehealth: Payer: Self-pay | Admitting: Pulmonary Disease

## 2013-05-30 NOTE — Telephone Encounter (Signed)
I spoke with Pam. She stated pt needs to have bladder surgery. This is not scheduled yet. She is wanting the okay from Dr. Vassie Loll and also will need the okay to hold coumadin 5 days prior to surgery. She stated we can wait and have this addressed by Dr. Vassie Loll when he comes into the office next week. Please advise thanks

## 2013-06-03 NOTE — Telephone Encounter (Signed)
He is high risk for general anesthesia - can use spinal or local OK to hold coumadin He was supposed to have venous duplex (if neg, can stop coumadin altogether)  -seems like he never did

## 2013-06-04 NOTE — Telephone Encounter (Signed)
I spoke with Pam and she is aware. Will forward to RA

## 2013-06-04 NOTE — Telephone Encounter (Signed)
[email or web address] x1 for Tracy [name]. i have faxed this phone note over to the fax # provided.   I spoke with pt. Pt did have this done back in July. Results are in EPIC from 04/17/13. Please advise Dr. Vassie Loll thanks

## 2013-06-04 NOTE — Telephone Encounter (Signed)
OK to dc coumadin

## 2013-06-04 NOTE — Telephone Encounter (Signed)
I spoke with pt and is aware. Nothing further needed

## 2013-06-05 ENCOUNTER — Other Ambulatory Visit: Payer: Self-pay | Admitting: Urology

## 2013-06-18 ENCOUNTER — Encounter (HOSPITAL_COMMUNITY): Payer: Self-pay | Admitting: Pharmacy Technician

## 2013-06-18 NOTE — Patient Instructions (Signed)
DAMONTAE LOPPNOW  06/18/2013   Your procedure is scheduled on:  06/25/13              Surgery [id number] am  Report to [facility] at    0530  AM.  Call this number if you have problems the morning of surgery: [phone]   Remember:   Do not eat food or drink liquids after midnight.   Take these medicines the morning of surgery with A SIP OF WATER:    Do not wear jewelry,   Do not wear lotions, powders, or perfumes.    Men may shave face and neck.  Do not bring valuables to the hospital.  Contacts, dentures or bridgework may not be worn into surgery.      Patients discharged the day of surgery will not be allowed to drive  home.  Name and phone number of your driver:    SEE CHG INSTRUCTION SHEET    Please read over the following fact sheets that you were given coughing and deep breathing exercises, leg exercises               Failure to comply with these instructions may result in cancellation of your surgery.                Patient Signature ____________________________              Nurse Signature _____________________________

## 2013-06-19 ENCOUNTER — Encounter (HOSPITAL_COMMUNITY): Payer: Self-pay

## 2013-06-19 ENCOUNTER — Encounter (HOSPITAL_COMMUNITY)
Admission: RE | Admit: 2013-06-19 | Discharge: 2013-06-19 | Disposition: A | Discharge status: Home / Self Care | Payer: Medicare Other | Source: Ambulatory Visit | Attending: Urology | Admitting: Urology

## 2013-06-19 DIAGNOSIS — Z01812 Encounter for preprocedural laboratory examination: Secondary | ICD-10-CM | POA: Insufficient documentation

## 2013-06-19 DIAGNOSIS — Z01818 Encounter for other preprocedural examination: Secondary | ICD-10-CM | POA: Insufficient documentation

## 2013-06-19 HISTORY — DX: Secondary polycythemia: D75.1

## 2013-06-19 HISTORY — DX: Acute embolism and thrombosis of unspecified deep veins of unspecified lower extremity: I82.409

## 2013-06-19 HISTORY — DX: Malignant (primary) neoplasm, unspecified: C80.1

## 2013-06-19 HISTORY — DX: Unspecified osteoarthritis, unspecified site: M19.90

## 2013-06-19 HISTORY — DX: Peripheral vascular disease, unspecified: I73.9

## 2013-06-19 HISTORY — DX: Other pulmonary embolism without acute cor pulmonale: I26.99

## 2013-06-19 LAB — CBC
HCT: 46.6 % (ref 39.0–52.0)
Hemoglobin: 15.1 g/dL (ref 13.0–17.0)
MCH: 29 pg (ref 26.0–34.0)
MCHC: 32.4 g/dL (ref 30.0–36.0)
MCV: 89.6 fL (ref 78.0–100.0)
Platelets: 183 10*3/uL (ref 150–400)
RBC: 5.2 MIL/uL (ref 4.22–5.81)
RDW: 17.1 % — ABNORMAL HIGH (ref 11.5–15.5)
WBC: 7.8 10*3/uL (ref 4.0–10.5)

## 2013-06-19 LAB — BASIC METABOLIC PANEL
BUN: 30 mg/dL — ABNORMAL HIGH (ref 6–23)
CO2: 29 mEq/L (ref 19–32)
Calcium: 9.3 mg/dL (ref 8.4–10.5)
Chloride: 98 mEq/L (ref 96–112)
Creatinine, Ser: 1.37 mg/dL — ABNORMAL HIGH (ref 0.50–1.35)
GFR calc Af Amer: 53 mL/min — ABNORMAL LOW (ref >90)
GFR calc non Af Amer: 45 mL/min — ABNORMAL LOW (ref >90)
Glucose, Bld: 103 mg/dL — ABNORMAL HIGH (ref 70–99)
Potassium: 4.3 mEq/L (ref 3.5–5.1)
Sodium: 138 mEq/L (ref 135–145)

## 2013-06-19 LAB — PROTIME-INR
INR: 1.07 (ref 0.00–1.49)
Prothrombin Time: 13.7 s (ref 11.6–15.2)

## 2013-06-19 LAB — APTT: aPTT: 32 seconds (ref 24–37)

## 2013-06-19 NOTE — Progress Notes (Signed)
Patient uses between 3-7 liters of oxygen per patient .  On average 3-4 liters.

## 2013-06-19 NOTE — Progress Notes (Signed)
Patient arrived for preop appointment at 0950am. Patient in wheelchair in waiting room with wife.  Patient stated " I do not have enough oxygen to wait very long.  "  Patient told the volunteer.  Nurse took patient back for presurgical testing appointment . Secretary- Josephine Igo went to Short Stay to obtain oxygen tank.  Patient placed on oxygen at 4 liters with O2sat running 94%.  Patient states anything above 90% is " good for him".  Patient in no respiratory distress.  Oxygen tank from home cut off.  Wife left purse at Admitting Desk. Aurea Graff in Admitting came and got wife and brought wife and purse back to PST appointment.  PST appointment completed .  Patient states I will have enough oxygen to go home I have 3/4 of tank  O2 sat remaining at 94%.  Patient into lab for labs to be drawn.  Labs completed on hospital O2 tank set at 4liters.  At end of lab O2 sat remaining at 94-95%.  Patient has remained in wheelchair entire visit.  Patient's wife stated he normally has extra tank in car but did not bring.  She stated she asked him to bring but he did not.  Patient wheeled to front of hospital with hospital 02 tank on at 4liters. Katrina Earlene Plater went and obtained car for patient and wheeled to front door of hospital.  Patient remains in no respiratory distress.  Converted hospital 02 tank to patient's 02 tank by patient.  Patient again stated he would have enough oxygen to get home.  Patient stated he had  3/4 of tank available and a full tank would last at least3 hours.  Patient placed in car along with wife.   Patient voiced no complaints.

## 2013-06-19 NOTE — Progress Notes (Signed)
Hospitalized per Dr Vassie Loll note of 04/10/13 for worsening hypoxia 12/13.   Diagnosed 12/13 with PE and DVT.

## 2013-06-19 NOTE — Progress Notes (Signed)
ECHO 02/21/13 EPIC  ABI 01/29/13 EPIC  Last office visit with Dr Vassie Loll 04/10/13 EPIC

## 2013-06-21 ENCOUNTER — Other Ambulatory Visit (HOSPITAL_BASED_OUTPATIENT_CLINIC_OR_DEPARTMENT_OTHER): Payer: Medicare Other | Admitting: Lab

## 2013-06-21 ENCOUNTER — Ambulatory Visit (HOSPITAL_BASED_OUTPATIENT_CLINIC_OR_DEPARTMENT_OTHER): Payer: Medicare Other

## 2013-06-21 ENCOUNTER — Ambulatory Visit (HOSPITAL_BASED_OUTPATIENT_CLINIC_OR_DEPARTMENT_OTHER): Payer: Medicare Other | Admitting: Hematology & Oncology

## 2013-06-21 VITALS — BP 115/75 | HR 71 | Resp 20

## 2013-06-21 VITALS — BP 116/65 | HR 70 | Temp 97.8°F | Resp 18 | Ht 70.0 in | Wt 203.0 lb

## 2013-06-21 DIAGNOSIS — I2699 Other pulmonary embolism without acute cor pulmonale: Secondary | ICD-10-CM

## 2013-06-21 DIAGNOSIS — D45 Polycythemia vera: Secondary | ICD-10-CM

## 2013-06-21 DIAGNOSIS — D751 Secondary polycythemia: Secondary | ICD-10-CM

## 2013-06-21 DIAGNOSIS — J449 Chronic obstructive pulmonary disease, unspecified: Secondary | ICD-10-CM

## 2013-06-21 DIAGNOSIS — J4489 Other specified chronic obstructive pulmonary disease: Secondary | ICD-10-CM

## 2013-06-21 LAB — CBC WITH DIFFERENTIAL (CANCER CENTER ONLY)
BASO#: 0 10*3/uL (ref 0.0–0.2)
EOS%: 4.7 % (ref 0.0–7.0)
Eosinophils Absolute: 0.4 10*3/uL (ref 0.0–0.5)
HGB: 14.9 g/dL (ref 13.0–17.1)
LYMPH%: 15.4 % (ref 14.0–48.0)
MCHC: 31.8 g/dL — ABNORMAL LOW (ref 32.0–35.9)
MONO%: 12.1 % (ref 0.0–13.0)
NEUT%: 67.3 % (ref 40.0–80.0)
Platelets: 197 10*3/uL (ref 145–400)
RDW: 17.3 % — ABNORMAL HIGH (ref 11.1–15.7)
WBC: 7.9 10*3/uL (ref 4.0–10.0)

## 2013-06-21 LAB — PROTIME-INR (CHCC SATELLITE): Protime: 14.4 Seconds — ABNORMAL HIGH (ref 10.6–13.4)

## 2013-06-21 LAB — FERRITIN CHCC: Ferritin: 40 ng/ml (ref 22–316)

## 2013-06-21 LAB — IRON AND TIBC CHCC
%SAT: 26 % (ref 20–55)
TIBC: 365 ug/dL (ref 202–409)
UIBC: 269 ug/dL (ref 117–376)

## 2013-06-21 NOTE — Patient Instructions (Signed)
Therapeutic Phlebotomy Therapeutic phlebotomy is the controlled removal of blood from your body for the purpose of treating a medical condition. It is similar to donating blood. Usually, about a pint (470 mL) of blood is removed. The average adult has 9 to 12 pints (4.3 to 5.7 L) of blood. Therapeutic phlebotomy may be used to treat the following medical conditions:  Hemochromatosis. This is a condition in which there is too much iron in the blood.  Polycythemia vera. This is a condition in which there are too many red cells in the blood.  Porphyria cutanea tarda. This is a disease usually passed from one generation to the next (inherited). It is a condition in which an important part of hemoglobin is not made properly. This results in the build up of abnormal amounts of porphyrins in the body.  Sickle cell disease. This is an inherited disease. It is a condition in which the red blood cells form an abnormal crescent shape rather than a round shape. LET YOUR CAREGIVER KNOW ABOUT:  Allergies.  Medicines taken including herbs, eyedrops, over-the-counter medicines, and creams.  Use of steroids (by mouth or creams).  Previous problems with anesthetics or numbing medicine.  History of blood clots.  History of bleeding or blood problems.  Previous surgery.  Possibility of pregnancy, if this applies. RISKS AND COMPLICATIONS This is a simple and safe procedure. Problems are unlikely. However, problems can occur and may include:  Nausea or lightheadedness.  Low blood pressure.  Soreness, bleeding, swelling, or bruising at the needle insertion site.  Infection. BEFORE THE PROCEDURE  This is a procedure that can be done as an outpatient. Confirm the time that you need to arrive for your procedure. Confirm whether there is a need to fast or withhold any medications. It is helpful to wear clothing with sleeves that can be raised above the elbow. A blood sample may be done to determine the  amount of red blood cells or iron in your blood. Plan ahead of time to have someone drive you home after the procedure. PROCEDURE The entire procedure from preparation through recovery takes about 1 hour. The actual collection takes about 10 to 15 minutes.  A needle will be inserted into your vein.  Tubing and a collection bag will be attached to that needle.  Blood will flow through the needle and tubing into the collection bag.  You may be asked to open and close your hand slowly and continuously during the entire collection.  Once the specified amount of blood has been removed from your body, the collection bag and tubing will be clamped.  The needle will be removed.  Pressure will be held on the site of the needle insertion to stop the bleeding. Then a bandage will be placed over the needle insertion site. AFTER THE PROCEDURE  Your recovery will be assessed and monitored. If there are no problems, as an outpatient, you should be able to go home shortly after the procedure.  Document Released: 02/15/2011 Document Revised: 12/06/2011 Document Reviewed: 02/15/2011 Exit[employer] Patient Information 2014 [employer].  

## 2013-06-21 NOTE — Progress Notes (Signed)
Tracy [name] presents today for phlebotomy per MD orders. Phlebotomy procedure started at 145 0 and ended at 1500. 500 grams removed. Patient observed for 30 minutes after procedure without any incident. Patient tolerated procedure well. IV needle removed intact.

## 2013-06-21 NOTE — Progress Notes (Signed)
This office note has been dictated.

## 2013-06-24 NOTE — H&P (Signed)
Tracy [name] is an 79 year old male with a history of transitional cell carcinoma and a right hydrocele.   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  He underwent a left nephroureterectomy in 06/2002.  In 05/2005, he had a bladder biopsy which revealed low-grade superficial transitional cell carcinoma.  That was a recurrence.  His first bladder tumor was discovered in 24.  A CT scan in 8/08 revealed normal upper tracts.      He has had a history of an elevated PSA in the past.  In 1996, his PSA was up to 8.95, but in 06/2006 it was 1.12.  He has some benign prostatic hypertrophy but no significant obstructive voiding symptoms.      Right hydrocele: This has been present for many years. It had previously been asymptomatic.  Interval history: I saw him last in 9/12 and recommended yearly followup for surveillance cystoscopy.  He has not experienced any hematuria or new irritative voiding symptoms. He did also want to discuss treatment of his hydrocele.  Although the hydrocele itself remains asymptomatic it is causing his scrotum to dangle into the toilet when he sits and wanted to discuss treatment of his hydrocele for that reason.   Past Medical History Problems  1. History of  Adult Sleep Apnea 780.57 2. History of  Arthritis V13.4 3. History of  Chronic Obstructive Pulmonary Disease 496 4. History of  Hypercholesterolemia 272.0 5. History of  Hypertension 401.9 6. 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 Transitional Cell Carcinoma Of The Bladder 188.9  Surgical History Problems  1. History of  Cystoscopy Bladder Tumor 596.9 2. History of  Cystoscopy Bladder Tumor 596.9 3. History of  Cystoscopy With Ureteroscopy For Biopsy Left 4. History of  Nephrectomy With Total Ureterectomy Left 5. History of  Surgery Tunica Vaginalis  Excision Of Hydrocele Right  Current Meds 1. Advair Diskus 250-50 MCG/DOSE Inhalation Aerosol Powder Breath Activated; Therapy:  28Jul2010 to 2. Fish Oil CAPS; Therapy: (Recorded:24Jan2008) to 3. Micardis HCT 40-12.5 MG Oral Tablet; Therapy: 28Mar2011 to 4. Multivitamin/Iron TABS; Therapy: (Recorded:24Jan2008) to 5. Oxygen Use; prn; Therapy: (Recorded:21Sep2011) to 6. Plavix 75 MG Oral Tablet; Therapy: (Recorded:24Jan2008) to 7. Simvastatin TABS; Therapy: (Recorded:21Sep2011) to 8. Spiriva HandiHaler 18 MCG Inhalation Capsule; Therapy: (Recorded:21Sep2011) to 9. Tylenol CAPS; Therapy: (Recorded:04Feb2008) to  Allergies Medication  1. Sulfa Drugs  Family History Problems  1. Family history of  Family Health Status Number Of Children 2 sons; 1 daughter  Social History Problems  1. Alcohol Use 1/wk 2. Caffeine Use 4 3. Family history of  Death In The Family Father 28, ? 4. Family history of  Death In The Family Mother 62, cancer 5. Marital History - Currently Married 6. History of  Tobacco Use V15.82 quit 20 years ago; smoked 1 1/2 packs per day for 35 years   Vitals Vital Signs   BMI Calculated: 28.35 BSA Calculated: 2.08 Height: 5 ft 10 in Weight: 198 lb  Blood Pressure: 131 / 72 Temperature: 97.2 F Heart Rate: 70  Review of Systems: Pertinent items are noted in HPI. A comprehensive review of systems was negative except as above.   Physical Exam: General appearance: alert and appears stated age Head: Normocephalic, without obvious abnormality, atraumatic Eyes: conjunctivae/corneas clear. EOM's intact.  Oropharynx: moist mucous membranes Neck: supple, symmetrical, trachea midline  Resp: normal respiratory effort Cardio: regular rate and rhythm Back: symmetric, no curvature. ROM normal. No CVA tenderness. GI: soft, non-tender; bowel sounds normal; no masses,  no organomegaly Male genitalia: penis: normal male phallus with no lesions or discharge. Testes:  bilaterally descended with no masses or tenderness. no hernias Pelvic: deferred Extremities: extremities normal, atraumatic, no cyanosis or edema Skin: Skin color normal. No visible rashes or lesions Neurologic: Grossly normal   Results/Data  The following clinical lab reports were reviewed:  UA: Clear.    Procedure  Procedure: Cystoscopy   Indication: History of Urothelial Carcinoma.  Informed Consent: Risks, benefits, and potential adverse events were discussed and informed consent was obtained from the patient.  Prep: The patient was prepped with betadine.  Anesthesia:. Local anesthesia was administered intraurethrally with 2% lidocaine jelly.  Procedure Note:  Urethral meatus:. No abnormalities.  Anterior urethra: No abnormalities . There is a mild stricture in the bulbar region that allows easy passage of the scope.  Prostatic urethra: No abnormalities . The lateral prostatic lobes were enlarged.  Bladder: Visulization was clear. The ureteral orifices were in the normal anatomic position bilaterally. The right ureteral orifice was in the normal anatomic position. The left ureteral orifice was absent. Examination of the bladder demonstrated trabeculation. A solitary tumor was visualized in the bladder. A papillary tumor was seen in the bladder. This tumor was located at the neck of the bladder. There is a small scar is on the floor the bladder were in the left-hand side. The patient tolerated the procedure well.  Complications: None.    Assessment Assessed  1. Transitional Cell Carcinoma Of The Bladder 188.9 2. 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 Hydrocele Right 603.9   I found a small papillary tumor just at the bladder neck at about the 6 o'clock position.  We therefore discussed the need for transurethral resection of this lesion.  It appears superficial and low grade.  I don't think mitomycin-C is therefore necessary in this  case.  We did discuss the procedure and he has been through this in the past. As far as his right hydrocele goes it is not increased in size but it is symptomatic in that it is causing his scrotum to hang in the toilet water.  I told him that that can occur without the presence of a hydrocele and that correction of his hydrocele may not completely eliminate that problem for him.  On the other hand having the hydrocele repaired very likely could result in his scrotum being less heavy and therefore hanging down last and remaining dry when he sits on the toilet.  We did discuss the incision used, the risks and palpitations and alternatives as well as anticipated postoperative course.  We also discussed the probability of success.  He understands and would like to proceed with both. He is on Coumadin.  He is taking that for PE.  He was told recently that he could potentially be getting off his Coumadin soon.   Plan   1.  I will obtain surgical clearance from his pulmonologist, Dr. Vassie Loll, due to his pulmonary condition and need for Coumadin. 2.  He will be scheduled for TURBT and right hydrocelectomy.

## 2013-06-24 NOTE — Anesthesia Preprocedure Evaluation (Addendum)
Anesthesia Evaluation  Patient identified by MRN, date of birth, ID band Patient awake    Reviewed: Allergy & Precautions, H&P , NPO status , Patient's Chart, lab work & pertinent test results  Airway Mallampati: II TM Distance: >3 FB Neck ROM: Full    Dental  (+) Teeth Intact and Chipped,    Pulmonary sleep apnea and Continuous Positive Airway Pressure Ventilation , COPD COPD inhaler and oxygen dependent, Current Smoker, PE breath sounds clear to auscultation  Pulmonary exam normal       Cardiovascular hypertension, Pt. on medications + Peripheral Vascular Disease and DVT Rhythm:Regular Rate:Normal     Neuro/Psych  Neuromuscular disease negative psych ROS   GI/Hepatic negative GI ROS, Neg liver ROS,   Endo/Other  negative endocrine ROS  Renal/GU negative Renal ROSRenal cancer  negative genitourinary   Musculoskeletal negative musculoskeletal ROS (+)   Abdominal   Peds  Hematology negative hematology ROS (+)   Anesthesia Other Findings   Reproductive/Obstetrics                          Anesthesia Physical Anesthesia Plan  ASA: III  Anesthesia Plan: Spinal   Post-op Pain Management:    Induction:   Airway Management Planned: Simple Face Mask  Additional Equipment:   Intra-op Plan:   Post-operative Plan:   Informed Consent: I have reviewed the patients History and Physical, chart, labs and discussed the procedure including the risks, benefits and alternatives for the proposed anesthesia with the patient or authorized representative who has indicated his/her understanding and acceptance.   Dental advisory given  Plan Discussed with: CRNA  Anesthesia Plan Comments:        Anesthesia Quick Evaluation

## 2013-06-25 ENCOUNTER — Encounter (HOSPITAL_COMMUNITY): Payer: Self-pay | Admitting: *Deleted

## 2013-06-25 ENCOUNTER — Ambulatory Visit (HOSPITAL_COMMUNITY): Payer: Medicare Other | Admitting: Anesthesiology

## 2013-06-25 ENCOUNTER — Ambulatory Visit (HOSPITAL_COMMUNITY)
Admission: RE | Admit: 2013-06-25 | Discharge: 2013-06-25 | Disposition: A | Discharge status: Home / Self Care | Payer: Medicare Other | Source: Ambulatory Visit | Attending: Urology | Admitting: Urology

## 2013-06-25 ENCOUNTER — Encounter (HOSPITAL_COMMUNITY)
Admission: RE | Disposition: A | Discharge status: Home / Self Care | Payer: Self-pay | Source: Ambulatory Visit | Attending: Urology

## 2013-06-25 ENCOUNTER — Encounter (HOSPITAL_COMMUNITY): Payer: Self-pay | Admitting: Anesthesiology

## 2013-06-25 DIAGNOSIS — C689 Malignant neoplasm of urinary organ, unspecified: Secondary | ICD-10-CM | POA: Diagnosis present

## 2013-06-25 DIAGNOSIS — I739 Peripheral vascular disease, unspecified: Secondary | ICD-10-CM | POA: Insufficient documentation

## 2013-06-25 DIAGNOSIS — C675 Malignant neoplasm of bladder neck: Secondary | ICD-10-CM | POA: Insufficient documentation

## 2013-06-25 DIAGNOSIS — I10 Essential (primary) hypertension: Secondary | ICD-10-CM | POA: Insufficient documentation

## 2013-06-25 DIAGNOSIS — Z85528 Personal history of other malignant neoplasm of kidney: Secondary | ICD-10-CM | POA: Insufficient documentation

## 2013-06-25 DIAGNOSIS — D494 Neoplasm of unspecified behavior of bladder: Secondary | ICD-10-CM

## 2013-06-25 DIAGNOSIS — E7800 Pure hypercholesterolemia, unspecified: Secondary | ICD-10-CM | POA: Insufficient documentation

## 2013-06-25 DIAGNOSIS — Z87891 Personal history of nicotine dependence: Secondary | ICD-10-CM | POA: Insufficient documentation

## 2013-06-25 DIAGNOSIS — J4489 Other specified chronic obstructive pulmonary disease: Secondary | ICD-10-CM | POA: Insufficient documentation

## 2013-06-25 DIAGNOSIS — Z79899 Other long term (current) drug therapy: Secondary | ICD-10-CM | POA: Insufficient documentation

## 2013-06-25 DIAGNOSIS — J449 Chronic obstructive pulmonary disease, unspecified: Secondary | ICD-10-CM | POA: Insufficient documentation

## 2013-06-25 DIAGNOSIS — N433 Hydrocele, unspecified: Secondary | ICD-10-CM | POA: Insufficient documentation

## 2013-06-25 DIAGNOSIS — G4730 Sleep apnea, unspecified: Secondary | ICD-10-CM | POA: Insufficient documentation

## 2013-06-25 HISTORY — DX: Malignant neoplasm of urinary organ, unspecified: C68.9

## 2013-06-25 HISTORY — PX: HYDROCELE EXCISION: SHX482

## 2013-06-25 HISTORY — PX: TRANSURETHRAL RESECTION OF BLADDER TUMOR: SHX2575

## 2013-06-25 SURGERY — TURBT (TRANSURETHRAL RESECTION OF BLADDER TUMOR)
Anesthesia: Spinal | Laterality: Right | Wound class: Clean Contaminated

## 2013-06-25 MED ORDER — TAMSULOSIN HCL 0.4 MG PO CAPS
0.4000 mg | ORAL_CAPSULE | Freq: Once | ORAL | Status: AC
  Administered 2013-06-25: 0.4 mg via ORAL
  Filled 2013-06-25: qty 1

## 2013-06-25 MED ORDER — PHENAZOPYRIDINE HCL 200 MG PO TABS
200.0000 mg | ORAL_TABLET | Freq: Three times a day (TID) | ORAL | Status: DC | PRN
Start: 2013-06-25 — End: 2013-07-31

## 2013-06-25 MED ORDER — LACTATED RINGERS IV SOLN
INTRAVENOUS | Status: DC | PRN
  Administered 2013-06-25 (×2): via INTRAVENOUS

## 2013-06-25 MED ORDER — PROPOFOL INFUSION 10 MG/ML OPTIME
INTRAVENOUS | Status: DC | PRN
  Administered 2013-06-25: 120 ug/kg/min via INTRAVENOUS

## 2013-06-25 MED ORDER — PHENYLEPHRINE HCL 10 MG/ML IJ SOLN
INTRAMUSCULAR | Status: DC | PRN
  Administered 2013-06-25 (×2): 80 ug via INTRAVENOUS

## 2013-06-25 MED ORDER — PHENYLEPHRINE HCL 10 MG/ML IJ SOLN
10.0000 mg | INTRAVENOUS | Status: DC | PRN
  Administered 2013-06-25: 100 ug/min via INTRAVENOUS

## 2013-06-25 MED ORDER — MIDAZOLAM HCL 5 MG/5ML IJ SOLN
INTRAMUSCULAR | Status: DC | PRN
  Administered 2013-06-25: 2 mg via INTRAVENOUS

## 2013-06-25 MED ORDER — BUPIVACAINE-EPINEPHRINE (PF) 0.5% -1:200000 IJ SOLN
INTRAMUSCULAR | Status: AC
  Filled 2013-06-25: qty 30

## 2013-06-25 MED ORDER — FENTANYL CITRATE 0.05 MG/ML IJ SOLN
INTRAMUSCULAR | Status: DC | PRN
  Administered 2013-06-25: 50 ug via INTRAVENOUS

## 2013-06-25 MED ORDER — PROMETHAZINE HCL 25 MG/ML IJ SOLN: 6.2500 mg | INTRAMUSCULAR | Status: DC | PRN

## 2013-06-25 MED ORDER — OXYCODONE-ACETAMINOPHEN 10-325 MG PO TABS: 1.0000 | ORAL_TABLET | ORAL | Status: DC | PRN

## 2013-06-25 MED ORDER — BACITRACIN-NEOMYCIN-POLYMYXIN 400-5-5000 EX OINT
TOPICAL_OINTMENT | CUTANEOUS | Status: AC
  Filled 2013-06-25: qty 1

## 2013-06-25 MED ORDER — BUPIVACAINE-EPINEPHRINE PF 0.5-1:200000 % IJ SOLN
INTRAMUSCULAR | Status: DC | PRN
  Administered 2013-06-25: 10 mL

## 2013-06-25 MED ORDER — PHENAZOPYRIDINE HCL 200 MG PO TABS
200.0000 mg | ORAL_TABLET | Freq: Once | ORAL | Status: AC
  Administered 2013-06-25: 200 mg via ORAL

## 2013-06-25 MED ORDER — BUPIVACAINE IN DEXTROSE 0.75-8.25 % IT SOLN
INTRATHECAL | Status: DC | PRN
  Administered 2013-06-25: 2 mL via INTRATHECAL

## 2013-06-25 MED ORDER — CIPROFLOXACIN IN D5W 200 MG/100ML IV SOLN
200.0000 mg | INTRAVENOUS | Status: AC
  Administered 2013-06-25: 200 mg via INTRAVENOUS
  Filled 2013-06-25: qty 100

## 2013-06-25 MED ORDER — HYDROMORPHONE HCL PF 1 MG/ML IJ SOLN: 0.2500 mg | INTRAMUSCULAR | Status: DC | PRN

## 2013-06-25 MED ORDER — PHENAZOPYRIDINE HCL 200 MG PO TABS
ORAL_TABLET | ORAL | Status: AC
  Filled 2013-06-25: qty 1

## 2013-06-25 MED ORDER — LACTATED RINGERS IV SOLN: INTRAVENOUS | Status: DC

## 2013-06-25 SURGICAL SUPPLY — 36 items
BAG URINE DRAINAGE (UROLOGICAL SUPPLIES) IMPLANT
BAG URO CATCHER STRL LF (DRAPE) ×3 IMPLANT
BANDAGE GAUZE ELAST BULKY 4 IN (GAUZE/BANDAGES/DRESSINGS) ×3 IMPLANT
CLOTH BEACON ORANGE TIMEOUT ST (SAFETY) ×3 IMPLANT
COVER SURGICAL LIGHT HANDLE (MISCELLANEOUS) ×3 IMPLANT
DRAPE CAMERA CLOSED 9X96 (DRAPES) ×3 IMPLANT
DRAPE LAPAROTOMY T 102X78X121 (DRAPES) ×3 IMPLANT
ELECT LOOP MED HF 24F 12D CBL (CLIP) ×1 IMPLANT
ELECT REM PT RETURN 9FT ADLT (ELECTROSURGICAL) ×3
ELECTRODE REM PT RTRN 9FT ADLT (ELECTROSURGICAL) ×2 IMPLANT
EVACUATOR MICROVAS BLADDER (UROLOGICAL SUPPLIES) IMPLANT
GAUZE SPONGE 4X4 16PLY XRAY LF (GAUZE/BANDAGES/DRESSINGS) ×2 IMPLANT
GLOVE BIOGEL M 8.0 STRL (GLOVE) ×3 IMPLANT
GOWN STRL REIN XL XLG (GOWN DISPOSABLE) ×4 IMPLANT
HOLDER FOLEY CATH W/STRAP (MISCELLANEOUS) IMPLANT
KIT ASPIRATION TUBING (SET/KITS/TRAYS/PACK) ×2 IMPLANT
KIT BASIN OR (CUSTOM PROCEDURE TRAY) ×3 IMPLANT
LOOPS RESECTOSCOPE DISP (ELECTROSURGICAL) ×2 IMPLANT
MANIFOLD NEPTUNE II (INSTRUMENTS) ×4 IMPLANT
NEEDLE HYPO 22GX1.5 SAFETY (NEEDLE) ×1 IMPLANT
NS IRRIG 1000ML POUR BTL (IV SOLUTION) ×2 IMPLANT
PACK CYSTO (CUSTOM PROCEDURE TRAY) ×3 IMPLANT
PACK GENERAL/GYN (CUSTOM PROCEDURE TRAY) ×3 IMPLANT
SPONGE GAUZE 4X4 12PLY (GAUZE/BANDAGES/DRESSINGS) ×1 IMPLANT
SPONGE LAP 4X18 X RAY DECT (DISPOSABLE) ×4 IMPLANT
SUPPORT SCROTAL LG STRP (MISCELLANEOUS) IMPLANT
SUPPORT SCROTAL MED ADLT STRP (MISCELLANEOUS) IMPLANT
SUT CHROMIC 3 0 PS 2 (SUTURE) ×1 IMPLANT
SUT CHROMIC 3 0 SH 27 (SUTURE) ×2 IMPLANT
SUT CHROMIC 4 0 RB 1X27 (SUTURE) IMPLANT
SYR CONTROL 10ML LL (SYRINGE) ×1 IMPLANT
SYRINGE IRR TOOMEY STRL 70CC (SYRINGE) ×1 IMPLANT
TOWEL OR 17X26 10 PK STRL BLUE (TOWEL DISPOSABLE) ×3 IMPLANT
TOWEL OR NON WOVEN STRL DISP B (DISPOSABLE) ×3 IMPLANT
TUBING CONNECTING 10 (TUBING) ×3 IMPLANT
WATER STERILE IRR 1500ML POUR (IV SOLUTION) IMPLANT

## 2013-06-25 NOTE — Anesthesia Procedure Notes (Signed)
Spinal  Patient location during procedure: OR Start time: 06/25/2013 7:29 AM End time: 06/25/2013 7:34 AM Staffing Anesthesiologist: Lucille Passy F Performed by: anesthesiologist  Preanesthetic Checklist Completed: patient identified, site marked, surgical consent, pre-op evaluation, timeout performed, IV checked, risks and benefits discussed and monitors and equipment checked Spinal Block Patient position: sitting Prep: Betadine Patient monitoring: heart rate, continuous pulse ox and blood pressure Injection technique: single-shot Needle Needle type: Spinocan  Needle gauge: 22 G Needle length: 9 cm Additional Notes Expiration date of kit checked and confirmed. Patient tolerated procedure well, without complications.  Lot [id number] DOE 08/2014

## 2013-06-25 NOTE — Transfer of Care (Signed)
Immediate Anesthesia Transfer of Care Note  Patient: Tracy [name]  Procedure(s) Performed: Procedure(s): TRANSURETHRAL RESECTION OF BLADDER TUMOR (TURBT) (N/A) HYDROCELECTOMY ADULT (Right)  Patient Location: PACU  Anesthesia Type:MAC and Spinal  Level of Consciousness: awake and alert   Airway & Oxygen Therapy: Patient Spontanous Breathing and Patient connected to face mask oxygen  Post-op Assessment: Report given to PACU RN and Post -op Vital signs reviewed and stable  Post vital signs: Reviewed and stable  Complications: No apparent anesthesia complications

## 2013-06-25 NOTE — Progress Notes (Signed)
In Phase 2 for 1.5 hours and is very eager to go home. Pt has had spinal anesthesia.States only "   A little tingling in my feet" Stood pt at bedside and states he is still" having some tingling in feet and buttock" Assisted pt back to bed. Pt uses his home inhalers

## 2013-06-25 NOTE — Interval H&P Note (Signed)
History and Physical Interval Note:  06/25/2013 7:19 AM  Tracy [name]  has presented today for surgery, with the diagnosis of bladder tumor right hydrocele  The various methods of treatment have been discussed with the patient and family. After consideration of risks, benefits and other options for treatment, the patient has consented to  Procedure(s): TRANSURETHRAL RESECTION OF BLADDER TUMOR (TURBT) (N/A) HYDROCELECTOMY ADULT (Right) as a surgical intervention .  The patient's history has been reviewed, patient examined, no change in status, stable for surgery.  I have reviewed the patient's chart and labs.  Questions were answered to the patient's satisfaction.     Garnett Farm

## 2013-06-25 NOTE — Op Note (Signed)
PATIENT:  Tracy [name]  PRE-OPERATIVE DIAGNOSIS: 1. Bladder tumor 2. Right hydrocele  POST-OPERATIVE DIAGNOSIS: Same  PROCEDURE:  Procedure(s): 1. TRANSURETHRAL RESECTION OF BLADDER TUMOR (TURBT) (1 cm.) 2. Right hydrocelectomy  SURGEON:  Surgeon(s): Garnett Farm  ANESTHESIA:   Spinal  EBL:  Minimal  DRAINS: None  SPECIMEN:  Source of Specimen:  Bladder tumor  DISPOSITION OF SPECIMEN:  PATHOLOGY  Indication: Mr. Mcburney is an 79 year old male with a history of transitional cell carcinoma of the bladder that has been low grade and superficial. He was found recently to have a single papillary tumor located at the bladder neck at the 6:00 position. In addition he has a moderate sized right hydrocele that has become problematic because of its size and he has elected to proceed with treatment of that as well.  Description of operation: The patient was taken to the operating room and administered general anesthesia. He was then placed on the table and moved to the dorsal lithotomy position after which his genitalia was sterilely prepped and draped. An official timeout was then performed.  The 26 French resectoscope with Timberlake obturator was then introduced into the bladder and the obturator was removed. The resectoscope element with 12 lens was then inserted and the bladder was fully and systematically inspected. Ureteral orifices were noted to be in the normal anatomic positions. A single papillary tumor was noted at the bladder neck and photographed.  I first began by resecting the bladder tumor. I then fulgurated the mucosa surrounding this and the base. Reinspection of the bladder revealed all obvious tumor had been fully resected and there was no evidence of perforation. I then removed all of the portions of bladder tumor which were sent to pathology, drained the bladder and removed the resectoscope.  He was then repositioned in the supine position and his genitalia was  sterilely prepped and draped a second time. I made a midline incision and carried this down to the right hydrocele. This was drained of clear, amber fluid. The excess hydrocele sac was then reapproximated posteriorly behind the testicle using running, locking 3-0 chromic. The testicle was then placed in the normal anatomic position and his right hemiscrotum. He asked if there was any fluid around his left testicle so I made a 2 mm opening in the tunica and drained only a few drops of clear amber fluid from the left side. This confirmed no significant hydrocele fluid present on that side.  I closed the incision with running, locking 3-0 chromic reapproximating the deep scrotal tissue over the testicle. I then injected 10 cc of 1/2% Marcaine with epinephrine and closed the skin with running 3-0 chromic suture. I applied Neosporin, sterile 4 x 4's and a fluffed Curlex as well as scrotal support. The patient tolerated the procedure well and there were no intraoperative complications.  PLAN OF CARE: Discharge to home after PACU  PATIENT DISPOSITION:  PACU - hemodynamically stable.

## 2013-06-25 NOTE — Anesthesia Postprocedure Evaluation (Signed)
Anesthesia Post Note  Patient: Tracy [name]  Procedure(s) Performed: Procedure(s) (LRB): TRANSURETHRAL RESECTION OF BLADDER TUMOR (TURBT) (N/A) HYDROCELECTOMY ADULT (Right)  Anesthesia type: General  Patient location: PACU  Post pain: Pain level controlled  Post assessment: Post-op Vital signs reviewed  Last Vitals:  Filed Vitals:   06/25/13 1025  BP: 119/60  Pulse: 58  Temp: 36.1 C  Resp:     Post vital signs: Reviewed  Level of consciousness: sedated  Complications: No apparent anesthesia complications

## 2013-06-26 ENCOUNTER — Encounter (HOSPITAL_COMMUNITY): Payer: Self-pay | Admitting: Urology

## 2013-06-26 NOTE — Progress Notes (Signed)
CC:   Danise Edge, MD  DIAGNOSES: 1. 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 2. 2 pulmonary embolism/deep venous thrombosis -- idiopathic.  CURRENT THERAPY: 1. Phlebotomy to maintain hematocrit below 45%. 2. Patient recently stopped Coumadin and will start aspirin at 162 mg     p.o. daily after his cystoscopy on 06/25/2013.  INTERIM HISTORY:  Mr. Lottman comes in for followup.  He is doing okay. He is having a cystoscopy on the 29th of September.  He has a small bladder tumor.  He is off Coumadin now.  He was taken off Coumadin by, I think, the pulmonologist.  He was on it for probably about 9 months.  He did not have any evidence of residual PE or DVT on recent studies.  He has done well with his phlebotomies.  We are getting the iron out of him.  Back in July, his ferritin was down to 27% with an iron saturation of 26%.  He has had no problems with nausea or vomiting.  He still wears his oxygen all the time.  He has had no exacerbations of his COPD.  He has had some occasional leg swelling.  He has not noted anything that has been permanent.  His DVT was in the right leg.  This seems to swell up a little bit more than the other leg, but does go down when he lies down.  He has had no bleeding.  He has had no mouth sores.  He has had no headache.  PHYSICAL EXAMINATION:  General:  This is an elderly white gentleman in no obvious distress.  Vital signs:  Temperature of 97.8, pulse 70, respiratory rate 18, blood pressure 116/65.  Weight is 203.  Head and neck:  Normocephalic, atraumatic skull.  There are no ocular or oral lesions.  There are no palpable cervical or supraclavicular lymph nodes. Lungs:  Decreased breath sounds throughout all lung fields.  Cardiac: Regular rate and rhythm with a normal S1 and S2.  There are no murmurs, rubs, or bruits.  Abdomen:  Soft.  He has good bowel sounds.  There is no palpable abdominal mass.  There is no  palpable hepatosplenomegaly. Extremities:  Some mild nonpitting edema of his lower legs.  He has good pulses.  No obvious venous cord noted in the legs.  LABORATORY STUDIES:  White cell count is 7.9, hemoglobin 14.9, hematocrit 46.8, platelet count 197.  IMPRESSION:  Mr. Difrancesco is an 79 year old gentleman.  He had a pulmonary embolus and deep venous thrombosis of the right leg back in December 2013.  He is off Coumadin now.  I do not see any problems with him having his surgery for the cystoscopy next Monday.  I do think that we should probably phlebotomize him.  This actually might help him a little bit.  We will plan to get him back in another 2 months.  Again, I told him to take 2 baby aspirin a day.  I told him to make sure that he takes it with food.    ______________________________ Josph Macho, M.D. PRE/MEDQ  D:  06/21/2013  T:  06/26/2013  Job:  [id number]

## 2013-07-06 ENCOUNTER — Telehealth: Payer: Self-pay | Admitting: General Practice

## 2013-07-06 NOTE — Telephone Encounter (Signed)
Faxed order to check patient's INR Herbert Seta, RN @ [address]. (Please check on 10/13 or 10/14).

## 2013-07-09 ENCOUNTER — Other Ambulatory Visit: Payer: Self-pay | Admitting: Internal Medicine

## 2013-07-09 NOTE — Telephone Encounter (Signed)
Rx request to pharmacy/SLS  

## 2013-07-10 ENCOUNTER — Ambulatory Visit (INDEPENDENT_AMBULATORY_CARE_PROVIDER_SITE_OTHER): Payer: Medicare Other | Admitting: General Practice

## 2013-07-10 DIAGNOSIS — I2699 Other pulmonary embolism without acute cor pulmonale: Secondary | ICD-10-CM

## 2013-07-23 ENCOUNTER — Other Ambulatory Visit: Payer: Self-pay | Admitting: Internal Medicine

## 2013-07-23 NOTE — Telephone Encounter (Signed)
Refill sent for pt. Pt last seen in July and advised follow up with Dr Abner Greenspan in October.  Please call pt to arrange appt.

## 2013-07-24 NOTE — Telephone Encounter (Signed)
Spoke to patient wife and she says that she will inform him of medication refill and he will call back to schedule appointment.

## 2013-07-31 ENCOUNTER — Telehealth: Payer: Self-pay | Admitting: Family Medicine

## 2013-07-31 ENCOUNTER — Encounter: Payer: Self-pay | Admitting: Family Medicine

## 2013-07-31 ENCOUNTER — Ambulatory Visit (INDEPENDENT_AMBULATORY_CARE_PROVIDER_SITE_OTHER): Payer: Medicare Other | Admitting: Family Medicine

## 2013-07-31 VITALS — BP 132/80 | HR 72 | Temp 97.5°F | Ht 70.0 in | Wt 207.0 lb

## 2013-07-31 DIAGNOSIS — E785 Hyperlipidemia, unspecified: Secondary | ICD-10-CM

## 2013-07-31 DIAGNOSIS — C689 Malignant neoplasm of urinary organ, unspecified: Secondary | ICD-10-CM

## 2013-07-31 DIAGNOSIS — C801 Malignant (primary) neoplasm, unspecified: Secondary | ICD-10-CM

## 2013-07-31 DIAGNOSIS — I10 Essential (primary) hypertension: Secondary | ICD-10-CM

## 2013-07-31 DIAGNOSIS — J4489 Other specified chronic obstructive pulmonary disease: Secondary | ICD-10-CM

## 2013-07-31 DIAGNOSIS — J449 Chronic obstructive pulmonary disease, unspecified: Secondary | ICD-10-CM

## 2013-07-31 NOTE — Patient Instructions (Signed)

## 2013-07-31 NOTE — Telephone Encounter (Signed)
Patient has cpe in January/2015. He also see's Dr. Myna Hidalgo and thinks that he will be having blood work around the same time. He would like to know if Dr. Gustavo Lah lab could also draw for his cpe labs?

## 2013-08-01 NOTE — Telephone Encounter (Signed)
Please advise 

## 2013-08-01 NOTE — Telephone Encounter (Signed)
So I believe he can have his labs at Dr Gustavo Lah office with my order I am just not sure how to place the order and have it get there. He needs lipid, renal, cbc, hepatic, tsh, hgba1c. Please check with Dr Gustavo Lah lab about how they want those orders placed, for hyperglycemia and HTN

## 2013-08-05 ENCOUNTER — Encounter: Payer: Self-pay | Admitting: Family Medicine

## 2013-08-05 NOTE — Assessment & Plan Note (Signed)
Is following with urology

## 2013-08-05 NOTE — Assessment & Plan Note (Signed)
Long standing hypoxia, stable on O2 numbers better at home at rest. No recent illness

## 2013-08-05 NOTE — Progress Notes (Signed)
Patient ID: Tracy [name], male   DOB: 02-03-1926, 79 y.o.   MRN: [id number] Tracy [name] [id number] 10/02/1925 08/05/2013      Progress Note-Follow Up  Subjective  Chief Complaint  Chief Complaint  Patient presents with  . Follow-up    HPI  Patient is a 79 yo caucasian male in today for follow up. He has been diagnosed with bladder cancer and is being cared for by urology. No GU c/o today. No fevers or chills, struggles with chronic sob but this is not worse than usual. He denies cp/palp/sob gi or gu complaints. Is taking meds as prescribed. Using O2 via nasal cannula routinely now.  Past Medical History  Diagnosis Date  . Chronic airway obstruction, not elsewhere classified   . Unspecified essential hypertension   . Cerebrovascular disease, unspecified   . Other and unspecified hyperlipidemia   . Diaphragmatic hernia without mention of obstruction or gangrene   . Diverticulosis of colon (without mention of hemorrhage)   . Benign neoplasm of colon   . Overweight(278.02)   . On home oxygen therapy   . Right leg pain 01/18/2013  . H/O tobacco use, presenting hazards to health 01/25/2013  . Edema 04/18/2013  . OSA on CPAP   . Peripheral vascular disease     hx of DVT stopped Coumadin 06/05/2013   . Cancer     kidney cancer   . Arthritis   . Polycythemia   . DVT (deep venous thrombosis)     2014   . Pulmonary embolism   . 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 Hernia repair    . Nephroureterectomy    . Blasdder tumor removed     . Transurethral resection of bladder tumor N/A 06/25/2013    Procedure: TRANSURETHRAL RESECTION OF BLADDER TUMOR (TURBT);  Surgeon: Garnett Farm, MD;  Location: [facility];  Service: Urology;  Laterality: N/A;  . Hydrocele excision Right 06/25/2013    Procedure: HYDROCELECTOMY ADULT;  Surgeon: Garnett Farm, MD;  Location: [facility];  Service: Urology;  Laterality: Right;    No family history on  file.  History   Social History  . Marital Status: Married    Spouse Name: Barbette Or    Number of Children: 3  . Years of Education: N/A   Occupational History  .     Social History Main Topics  . Smoking status: Former Smoker -- 1.50 packs/day    Types: Cigarettes    Quit date: 09/27/1978  . Smokeless tobacco: Never Used  . Alcohol Use: No  . Drug Use: No  . Sexual Activity: No   Other Topics Concern  . Not on file   Social History Narrative  . No narrative on file    Current Outpatient Prescriptions on File Prior to Visit  Medication Sig Dispense Refill  . acetaminophen (TYLENOL ARTHRITIS PAIN) 650 MG CR tablet Take 650 mg by mouth every morning.       Marland Kitchen ADVAIR DISKUS 250-50 MCG/DOSE AEPB IHALE ONE PUFF TWICE DAILY  60 each  3  . furosemide (LASIX) 40 MG tablet Take 1 tablet in the AM and 1/2 tablet in the evening  45 tablet  6  . guaiFENesin (MUCINEX) 600 MG 12 hr tablet Take 1,200 mg by mouth 2 (two) times daily.       Marland Kitchen losartan (COZAAR) 50 MG tablet Take 25 mg by mouth at bedtime.       Marland Kitchen  Omega-3 Fatty Acids (FISH OIL) 1200 MG CAPS Take 1,200 mg by mouth every morning.      . psyllium (METAMUCIL) 58.6 % packet Take 1 packet by mouth daily.      . simvastatin (ZOCOR) 40 MG tablet TAKE ONE (1) TABLET EACH DAY  30 tablet  1  . SPIRIVA HANDIHALER 18 MCG inhalation capsule INHALE CONTENTS OF ONE CAPSULE DAILY  30 capsule  3   No current facility-administered medications on file prior to visit.    Allergies  Allergen Reactions  . Sulfamethoxazole Other (See Comments)    REACTION: "washes" him out per pt    Review of Systems  Review of Systems  Constitutional: Negative for fever and malaise/fatigue.  HENT: Negative for congestion.   Eyes: Negative for discharge.  Respiratory: Negative for shortness of breath.   Cardiovascular: Negative for chest pain, palpitations and leg swelling.  Gastrointestinal: Negative for nausea, abdominal pain and diarrhea.   Genitourinary: Negative for dysuria.  Musculoskeletal: Negative for falls.  Skin: Negative for rash.  Neurological: Negative for loss of consciousness and headaches.  Endo/Heme/Allergies: Negative for polydipsia.  Psychiatric/Behavioral: Negative for depression and suicidal ideas. The patient is not nervous/anxious and does not have insomnia.     Objective  BP 132/80  Pulse 72  Temp(Src) 97.5 F (36.4 C) (Oral)  Ht 5\' 10"  (1.778 m)  Wt 207 lb (93.895 kg)  BMI 29.70 kg/m2  SpO2 82%  Physical Exam  Physical Exam  Constitutional: He is oriented to person, place, and time and well-developed, well-nourished, and in no distress. No distress.  HENT:  Head: Normocephalic and atraumatic.  Eyes: Conjunctivae are normal.  Neck: Neck supple. No thyromegaly present.  Cardiovascular: Normal rate, regular rhythm and normal heart sounds.   Pulmonary/Chest: Effort normal and breath sounds normal. No respiratory distress.  O2 via nasal cannula, decreased breath sounds b/l bases.   Abdominal: He exhibits no distension and no mass. There is no tenderness.  Musculoskeletal: He exhibits no edema.  Neurological: He is alert and oriented to person, place, and time.  Skin: Skin is warm.  Psychiatric: Memory, affect and judgment normal.    Lab Results  Component Value Date   TSH 2.65 01/11/2011   Lab Results  Component Value Date   WBC 7.9 06/21/2013   HGB 14.9 06/21/2013   HCT 46.8 06/21/2013   MCV 91 06/21/2013   PLT 197 06/21/2013   Lab Results  Component Value Date   CREATININE 1.37* 06/19/2013   BUN 30* 06/19/2013   NA 138 06/19/2013   K 4.3 06/19/2013   CL 98 06/19/2013   CO2 29 06/19/2013   Lab Results  Component Value Date   ALT 20 02/20/2013   AST 21 02/20/2013   ALKPHOS 75 02/20/2013   BILITOT 0.3 02/20/2013   Lab Results  Component Value Date   CHOL 162 10/04/2012   Lab Results  Component Value Date   HDL 48 10/04/2012   Lab Results  Component Value Date   LDLCALC 89  10/04/2012   Lab Results  Component Value Date   TRIG 124 10/04/2012   Lab Results  Component Value Date   CHOLHDL 3.4 10/04/2012     Assessment & Plan  HYPERTENSION, BORDERLINE Well controlled no changes. Has recently dropped his Losartan in 1/2 with good results.  Urothelial carcinoma Is following with urology  HYPERLIPIDEMIA Well controled, no changes. Avoid trans fats.  COPD Long standing hypoxia, stable on O2 numbers better at home at rest. No  recent illness

## 2013-08-05 NOTE — Assessment & Plan Note (Signed)
Well controled, no changes. Avoid trans fats.

## 2013-08-05 NOTE — Assessment & Plan Note (Signed)
Well controlled no changes. Has recently dropped his Losartan in 1/2 with good results.

## 2013-08-08 ENCOUNTER — Other Ambulatory Visit (HOSPITAL_BASED_OUTPATIENT_CLINIC_OR_DEPARTMENT_OTHER): Payer: Medicare Other | Admitting: Lab

## 2013-08-08 ENCOUNTER — Ambulatory Visit (HOSPITAL_BASED_OUTPATIENT_CLINIC_OR_DEPARTMENT_OTHER): Payer: Medicare Other | Admitting: Hematology & Oncology

## 2013-08-08 VITALS — BP 128/61 | HR 62 | Temp 97.7°F | Resp 20 | Ht 68.0 in | Wt 207.0 lb

## 2013-08-08 DIAGNOSIS — D649 Anemia, unspecified: Secondary | ICD-10-CM

## 2013-08-08 DIAGNOSIS — I2699 Other pulmonary embolism without acute cor pulmonale: Secondary | ICD-10-CM

## 2013-08-08 DIAGNOSIS — D45 Polycythemia vera: Secondary | ICD-10-CM

## 2013-08-08 DIAGNOSIS — D751 Secondary polycythemia: Secondary | ICD-10-CM

## 2013-08-08 LAB — CBC WITH DIFFERENTIAL (CANCER CENTER ONLY)
BASO#: 0 10*3/uL (ref 0.0–0.2)
BASO%: 0.4 % (ref 0.0–2.0)
EOS%: 5.9 % (ref 0.0–7.0)
HCT: 45.9 % (ref 38.7–49.9)
HGB: 14.4 g/dL (ref 13.0–17.1)
LYMPH#: 1.2 10*3/uL (ref 0.9–3.3)
LYMPH%: 14.3 % (ref 14.0–48.0)
MCH: 29.1 pg (ref 28.0–33.4)
MCHC: 31.4 g/dL — ABNORMAL LOW (ref 32.0–35.9)
MCV: 93 fL (ref 82–98)
MONO%: 12.3 % (ref 0.0–13.0)
NEUT%: 67.1 % (ref 40.0–80.0)
Platelets: 196 10*3/uL (ref 145–400)
RDW: 15.4 % (ref 11.1–15.7)
WBC: 8.5 10*3/uL (ref 4.0–10.0)

## 2013-08-08 LAB — IRON AND TIBC CHCC: TIBC: 371 ug/dL (ref 202–409)

## 2013-08-08 NOTE — Progress Notes (Signed)
This office note has been dictated.

## 2013-08-09 LAB — D-DIMER, QUANTITATIVE: D-Dimer, Quant: 0.5 ug{FEU}/mL — ABNORMAL HIGH (ref 0.00–0.48)

## 2013-08-10 NOTE — Telephone Encounter (Signed)
Lab order placed for [facility]

## 2013-08-17 ENCOUNTER — Encounter: Payer: Self-pay | Admitting: Physician Assistant

## 2013-08-17 ENCOUNTER — Ambulatory Visit (INDEPENDENT_AMBULATORY_CARE_PROVIDER_SITE_OTHER): Payer: Medicare Other | Admitting: Physician Assistant

## 2013-08-17 VITALS — BP 120/82 | HR 99 | Temp 98.9°F | Resp 22 | Ht 68.0 in | Wt 208.0 lb

## 2013-08-17 DIAGNOSIS — R6883 Chills (without fever): Secondary | ICD-10-CM

## 2013-08-17 NOTE — Patient Instructions (Signed)
Increase fluid intake.  Rest.  Tylenol if fever returns.  If fever persists despite medication or you develop a fever of 102 or higher, please go to an Urgent Care or notify nursing staff at your Assisted Living Facility.

## 2013-08-17 NOTE — Progress Notes (Signed)
Pre visit review using our clinic review tool, if applicable. No additional management support is needed unless otherwise documented below in the visit note/SLS  

## 2013-08-18 NOTE — Progress Notes (Signed)
DIAGNOSES: 1. Pulmonary embolism/deep vein thrombosis, idiopathic. 2. Secondary polycythemia.  CURRENT THERAPY: 1. Phlebotomy to maintain hematocrit below 45%. 2. Aspirin 162 mg p.o. daily.  INTERIM HISTORY:  Tracy [name] comes in for his followup.  He is doing fairly well.  He had no problems with his cystoscopy.  He apparently was found to have a noninvasive bladder tumor.  This was resected.  There was a low-grade papillary carcinoma.  Again, there is no stromal invasion.  It does not sound like he needs any type of therapy for this from what he says.  He sees Dr. Vernie Ammons in a couple months.  We did phlebotomized him last time he was here.  He is doing well with the aspirin.  He has had no problems with this.  He had a good Veteran's Day yesterday.  He served in the [place] War.  He has had no issues with nausea or vomiting.  He has had no change in bowel or bladder habits.  There has been some slight leg swelling, but this is chronic.  PHYSICAL EXAMINATION:  General:  This is an elderly white gentleman, on oxygen.  Vital signs:  Temperature of 97.7, pulse 62, respiratory rate 20, blood pressure 128/61, weight is 207 pounds.  Head and Neck: Normocephalic, atraumatic skull.  There are no ocular or oral lesions. There are no palpable cervical or supraclavicular lymph nodes.  Lungs: Clear bilaterally.  He does have decreased breath sounds throughout both lung fields.  Cardiac:  Regular rate and rhythm with a normal S1 and S2. There are no murmurs, rubs or bruits.  Abdomen:  Soft.  He has good bowel sounds.  He is moderately obese.  There is no fluid wave.  There is no palpable hepatosplenomegaly.  Extremities:  Some chronic slight nonpitting edema of the legs.  No venous cords are noted in his lower legs.  He has decent pulses.  Neurological:  No focal neurological deficits.  Skin:  No rashes, ecchymosis, or petechia.  LABORATORY STUDIES:  White cell count is 8.5, hemoglobin  14.4, hematocrit 45.9, platelet count 136.  IMPRESSION:  Tracy [name] is a very nice 79 year old gentleman.  He had DVT/pulmonary embolus.  His last Doppler was done back in July. Everything was fine with no DVTs in the legs.  His prognosis is clearly based on his COPD.  I think we will hold off on phlebotomizing him for right now.  He feels well.  He does not look plethoric.  We will go ahead and plan to get him back to see [place] in another couple months.  He may need to be phlebotomized at that point of time.    ______________________________ Josph Macho, M.D. PRE/MEDQ  D:  08/08/2013  T:  08/18/2013  Job:  [id number]

## 2013-08-19 DIAGNOSIS — R6883 Chills (without fever): Secondary | ICD-10-CM | POA: Insufficient documentation

## 2013-08-19 NOTE — Assessment & Plan Note (Signed)
Patient asymptomatic and afebrile at time of exam.  Physical exam within normal limits.  Respirations at 22 but unchanged from prior visits.  Patient's O2 machine at 5L with sufficient saturations.  Flu swab negative.  Encourage patient to increase fluid intake and to get plenty of rest.  Can start a zinc supplement.  Tylenol for fever reduction if fever recurs.  Continue O2.  If symptoms recur over the weekend and worsen, or if patient develops a persistent fever he has been instructed to seek care.  Patient voices understanding.

## 2013-08-19 NOTE — Progress Notes (Signed)
Patient ID: Tracy [name], male   DOB: Jun 06, 1926, 79 y.o.   MRN: [id number]  Patient presents to clinic today c/o fever of 100, and chills that he experienced earlier today while working outside.  Patient states the chills have gone away, but he wanted to get checked out.  Denies coryza, muscle aches, nausea or vomiting.  Has noticed a mild decrease in appetite today.  Denies diarrhea or abdominal discomfort.  Denies increased work of breathing from baseline COPD.  Denies sick contact.  Has had flu shot this year.  Patient has not taken anything for fever.   Past Medical History  Diagnosis Date  . Chronic airway obstruction, not elsewhere classified   . Unspecified essential hypertension   . Cerebrovascular disease, unspecified   . Other and unspecified hyperlipidemia   . Diaphragmatic hernia without mention of obstruction or gangrene   . Diverticulosis of colon (without mention of hemorrhage)   . Benign neoplasm of colon   . Overweight(278.02)   . On home oxygen therapy   . Right leg pain 01/18/2013  . H/O tobacco use, presenting hazards to health 01/25/2013  . Edema 04/18/2013  . OSA on CPAP   . Peripheral vascular disease     hx of DVT stopped Coumadin 06/05/2013   . Cancer     kidney cancer   . Arthritis   . Polycythemia   . DVT (deep venous thrombosis)     2014   . Pulmonary embolism   . Urothelial carcinoma 06/25/2013    Current Outpatient Prescriptions on File Prior to Visit  Medication Sig Dispense Refill  . acetaminophen (TYLENOL ARTHRITIS PAIN) 650 MG CR tablet Take 650 mg by mouth every morning.       Marland Kitchen ADVAIR DISKUS 250-50 MCG/DOSE AEPB IHALE ONE PUFF TWICE DAILY  60 each  3  . aspirin 81 MG tablet Take 81 mg by mouth daily.      . furosemide (LASIX) 40 MG tablet Take 1 tablet in the AM and 1/2 tablet in the evening  45 tablet  6  . guaiFENesin (MUCINEX) 600 MG 12 hr tablet Take 1,200 mg by mouth 2 (two) times daily.       Marland Kitchen losartan (COZAAR) 50 MG tablet Take 25 mg by  mouth at bedtime.       . Omega-3 Fatty Acids (FISH OIL) 1200 MG CAPS Take 1,200 mg by mouth every morning.      . psyllium (METAMUCIL) 58.6 % packet Take 1 packet by mouth daily.      . simvastatin (ZOCOR) 40 MG tablet TAKE ONE (1) TABLET EACH DAY  30 tablet  1  . SPIRIVA HANDIHALER 18 MCG inhalation capsule INHALE CONTENTS OF ONE CAPSULE DAILY  30 capsule  3   No current facility-administered medications on file prior to visit.    Allergies  Allergen Reactions  . Sulfamethoxazole Other (See Comments)    REACTION: "washes" him out per pt    No family history on file.  History   Social History  . Marital Status: Married    Spouse Name: Barbette Or    Number of Children: 3  . Years of Education: N/A   Occupational History  .     Social History Main Topics  . Smoking status: Former Smoker -- 1.50 packs/day    Types: Cigarettes    Quit date: 09/27/1978  . Smokeless tobacco: Never Used  . Alcohol Use: No  . Drug Use: No  . Sexual Activity: No  Other Topics Concern  . None   Social History Narrative  . None    Review of Systems - See HPI.  All other ROS are negative.   Filed Vitals:   08/17/13 1521  BP: 120/82  Pulse: 99  Temp: 98.9 F (37.2 C)  Resp: 22   Physical Exam  Vitals reviewed. Constitutional: He is oriented to person, place, and time.  Overweight, pleasant elderly gentleman in no acute distress sitting on examination table.  HENT:  Head: Normocephalic and atraumatic.  Right Ear: External ear normal.  Left Ear: External ear normal.  Nose: Nose normal.  Mouth/Throat: Oropharynx is clear and moist. No oropharyngeal exudate.  Tympanic membranes within normal limits bilaterally.  No tenderness to percussion of sinuses.  Eyes: Conjunctivae are normal.  Neck: Neck supple.  Cardiovascular: Normal rate, regular rhythm, normal heart sounds and intact distal pulses.   Pulmonary/Chest: Effort normal and breath sounds normal. No respiratory distress. He  has no wheezes. He has no rales. He exhibits no tenderness.  Patient on 5L O2  Lymphadenopathy:    He has no cervical adenopathy.  Neurological: He is alert and oriented to person, place, and time. No cranial nerve deficit.  Skin: Skin is warm and dry. No rash noted.  Psychiatric: Affect normal.     Recent Results (from the past 2160 hour(s))  POCT INR     Status: None   Collection Time    05/23/13  9:58 AM      Result Value Range   INR 2.2     Comment: INR faxed in by [facility]  APTT     Status: None   Collection Time    06/19/13 10:20 AM      Result Value Range   aPTT 32  24 - 37 seconds  CBC     Status: Abnormal   Collection Time    06/19/13 10:20 AM      Result Value Range   WBC 7.8  4.0 - 10.5 K/uL   RBC 5.20  4.22 - 5.81 MIL/uL   Hemoglobin 15.1  13.0 - 17.0 g/dL   HCT 16.1  09.6 - 04.5 %   MCV 89.6  78.0 - 100.0 fL   MCH 29.0  26.0 - 34.0 pg   MCHC 32.4  30.0 - 36.0 g/dL   RDW 40.9 (*) 81.1 - 91.4 %   Platelets 183  150 - 400 K/uL  BASIC METABOLIC PANEL     Status: Abnormal   Collection Time    06/19/13 10:20 AM      Result Value Range   Sodium 138  135 - 145 mEq/L   Potassium 4.3  3.5 - 5.1 mEq/L   Chloride 98  96 - 112 mEq/L   CO2 29  19 - 32 mEq/L   Glucose, Bld 103 (*) 70 - 99 mg/dL   BUN 30 (*) 6 - 23 mg/dL   Creatinine, Ser 7.82 (*) 0.50 - 1.35 mg/dL   Calcium 9.3  8.4 - 95.6 mg/dL   GFR calc non Af Amer 45 (*) >90 mL/min   GFR calc Af Amer 53 (*) >90 mL/min   Comment: (NOTE)     The eGFR has been calculated using the CKD EPI equation.     This calculation has not been validated in all clinical situations.     eGFR's persistently <90 mL/min signify possible Chronic Kidney     Disease.  PROTIME-INR     Status: None   Collection Time  06/19/13 10:20 AM      Result Value Range   Prothrombin Time 13.7  11.6 - 15.2 seconds   INR 1.07  0.00 - 1.49  CBC WITH DIFFERENTIAL ([facility])     Status: Abnormal   Collection Time    06/21/13  1:23  PM      Result Value Range   WBC 7.9  4.0 - 10.0 10e3/uL   RBC 5.15  4.20 - 5.70 10e6/uL   HGB 14.9  13.0 - 17.1 g/dL   HCT 16.1  09.6 - 04.5 %   MCV 91  82 - 98 fL   MCH 28.9  28.0 - 33.4 pg   MCHC 31.8 (*) 32.0 - 35.9 g/dL   RDW 40.9 (*) 81.1 - 91.4 %   Platelets 197  145 - 400 10e3/uL   NEUT# 5.3  1.5 - 6.5 10e3/uL   LYMPH# 1.2  0.9 - 3.3 10e3/uL   MONO# 1.0 (*) 0.1 - 0.9 10e3/uL   Eosinophils Absolute 0.4  0.0 - 0.5 10e3/uL   BASO# 0.0  0.0 - 0.2 10e3/uL   NEUT% 67.3  40.0 - 80.0 %   LYMPH% 15.4  14.0 - 48.0 %   MONO% 12.1  0.0 - 13.0 %   EOS% 4.7  0.0 - 7.0 %   BASO% 0.5  0.0 - 2.0 %  PROTIME-INR ([facility])     Status: Abnormal   Collection Time    06/21/13  1:23 PM      Result Value Range   Protime 14.4 (*) 10.6 - 13.4 Seconds   INR 1.2 (*) 2.0 - 3.5   Comment: INR is useful only to assess adequacy of anticoagulation with coumadin when comparing results from different labs. It should not be used to estimate bleeding risk or presence/abscense of coagulopathy in patients not on coumadin. Expected INR ranges for      nontherapeutic patients is 0.88 - 1.12.   Lovenox No    IRON AND TIBC [facility]     Status: None   Collection Time    06/21/13  1:23 PM      Result Value Range   Iron 96  42 - 163 ug/dL   TIBC 782  956 - 213 ug/dL   UIBC 086  578 - 469 ug/dL   %SAT 26  20 - 55 %  FERRITIN [facility]     Status: None   Collection Time    06/21/13  1:23 PM      Result Value Range   Ferritin 40  22 - 316 ng/ml  CBC WITH DIFFERENTIAL ([facility])     Status: Abnormal   Collection Time    08/08/13 10:00 AM      Result Value Range   WBC 8.5  4.0 - 10.0 10e3/uL   RBC 4.95  4.20 - 5.70 10e6/uL   HGB 14.4  13.0 - 17.1 g/dL   HCT 62.9  52.8 - 41.3 %   MCV 93  82 - 98 fL   MCH 29.1  28.0 - 33.4 pg   MCHC 31.4 (*) 32.0 - 35.9 g/dL   RDW 24.4  01.0 - 27.2 %   Platelets 196  145 - 400 10e3/uL   NEUT# 5.7  1.5 - 6.5 10e3/uL   LYMPH# 1.2  0.9 - 3.3 10e3/uL   MONO# 1.0 (*) 0.1  - 0.9 10e3/uL   Eosinophils Absolute 0.5  0.0 - 0.5 10e3/uL   BASO# 0.0  0.0 - 0.2 10e3/uL  NEUT% 67.1  40.0 - 80.0 %   LYMPH% 14.3  14.0 - 48.0 %   MONO% 12.3  0.0 - 13.0 %   EOS% 5.9  0.0 - 7.0 %   BASO% 0.4  0.0 - 2.0 %  D-DIMER, QUANTITATIVE     Status: Abnormal   Collection Time    08/08/13 10:00 AM      Result Value Range   D-Dimer, Quant 0.50 (*) 0.00 - 0.48 ug/mL-FEU   Comment: At the inhouse established cutoff value of 0.48 ug/mL FEU, thismethology has been documented in the literature to have a sensitivityand negative predictive value of at least 98-99%.  The test resultshould be correlated with an assessment of the clinical      probability ofDVT/VTE.  IRON AND TIBC [facility]     Status: None   Collection Time    08/08/13 10:00 AM      Result Value Range   Iron 73  42 - 163 ug/dL   TIBC 409  811 - 914 ug/dL   UIBC 782  956 - 213 ug/dL   %SAT 20  20 - 55 %  FERRITIN [facility]     Status: None   Collection Time    08/08/13 10:00 AM      Result Value Range   Ferritin 29  22 - 316 ng/ml    Assessment/Plan: Chills Patient asymptomatic and afebrile at time of exam.  Physical exam within normal limits.  Respirations at 22 but unchanged from prior visits.  Patient's O2 machine at 5L with sufficient saturations.  Flu swab negative.  Encourage patient to increase fluid intake and to get plenty of rest.  Can start a zinc supplement.  Tylenol for fever reduction if fever recurs.  Continue O2.  If symptoms recur over the weekend and worsen, or if patient develops a persistent fever he has been instructed to seek care.  Patient voices understanding.

## 2013-08-20 ENCOUNTER — Telehealth: Payer: Self-pay | Admitting: Pulmonary Disease

## 2013-08-20 NOTE — Telephone Encounter (Signed)
I called and spoke with pt. He wanted appt in [place] tomorrow. He is scheduled to come in and see TP in the AM. Nothing further needed in [place]

## 2013-08-21 ENCOUNTER — Encounter: Payer: Self-pay | Admitting: Adult Health

## 2013-08-21 ENCOUNTER — Ambulatory Visit (HOSPITAL_BASED_OUTPATIENT_CLINIC_OR_DEPARTMENT_OTHER)
Admission: RE | Admit: 2013-08-21 | Discharge: 2013-08-21 | Disposition: A | Discharge status: Home / Self Care | Payer: Medicare Other | Source: Ambulatory Visit | Attending: Adult Health | Admitting: Adult Health

## 2013-08-21 ENCOUNTER — Ambulatory Visit (INDEPENDENT_AMBULATORY_CARE_PROVIDER_SITE_OTHER): Payer: Medicare Other | Admitting: Adult Health

## 2013-08-21 VITALS — BP 128/76 | HR 71 | Temp 97.8°F | Ht 70.0 in | Wt 206.0 lb

## 2013-08-21 DIAGNOSIS — J449 Chronic obstructive pulmonary disease, unspecified: Secondary | ICD-10-CM

## 2013-08-21 DIAGNOSIS — R042 Hemoptysis: Secondary | ICD-10-CM | POA: Insufficient documentation

## 2013-08-21 DIAGNOSIS — IMO0002 Reserved for concepts with insufficient information to code with codable children: Secondary | ICD-10-CM | POA: Insufficient documentation

## 2013-08-21 DIAGNOSIS — J4489 Other specified chronic obstructive pulmonary disease: Secondary | ICD-10-CM

## 2013-08-21 NOTE — Patient Instructions (Addendum)
Mucinex DM Twice daily  As needed  Cough/congestion  Chest xray today .  Continue on advair/ spiriva Please contact office for sooner follow up if symptoms do not improve or worsen or seek emergency care  Follow up Dr. Vassie Loll  In 1 month at [facility] Location

## 2013-08-21 NOTE — Progress Notes (Signed)
Subjective:    Patient ID: Tracy [name], male    DOB: Nov 06, 1925, 79 y.o.   MRN: [id number]  HPI PCP- [place]  79 y/o WM for FU of OSA; COPD, severe hypoxia  & new pulmonary nodule noted dec'13  H/o sub segmental PE/ RLE DVT  in dec 13  OBSTRUCTIVE SLEEP APNEA - ~ sleep study 3/06 w/ RDI 26 w/ desat to 76%...  COPD - on home O2 .  ~ PFT 11/04 showed FVC=4.51 (103%), FEV1=1.93 (67%) and DLCO~50%...  ~ CT Chest 9/05 & CT abd 1/12 showed marked emphysema w/ blebs, & thor spondylosis...  ~ Office spirometry 11/05 showed FEV1=1.57 (48%)...  ~ PFT 4/10 showed FVC= 3.00 (77%), FEV1= 1.37 (45%)  ~02/21/14 Echo with Bubble study >> mild LVH, EF 60 to 65%, mild AS, PAS 51 mmHg, no PFO  ~Oximizer added 02/23/13 for desats >5 /rest , 8l/m act/qhs   Hospitalised 09/26/12 for worsening hypoxia  Ct angio showed Subsegmental acute pulmonary thromboembolism in the right lower lobe, 7 mm spiculated lung lesion in the right middle lobe & bullous emphysema  DUplex rt popliteal DVt noted  PFTs -12/13- fev1 52% (1.30 ) -unchanged from last  DLCO 29%  ONO on cpap + 3L O2 showed drop in satn x 2h    Admitted 5/27-5/30/14  for AECOPD/decompensated cor pulmonale.  tx w/ IV lasix, steroids, nebs.  O2 adjusted w/ Oximizer for persistent desats. 8 w/ act/sleep and 5L at rest.  2 D echo w/ bubble study neg for PFO , mild LVH, nml EF. PAS 51  Discharged on Lasix 40mg  and steroid taper pack.  >>O2 at 8 l/m w /oximizer w/ act.  "best he has felt in long time, loved his time at the hospital"    03/21/13 Increase lasix twice daily x 3ds -second dose by 3 pm -  He stated once he went back to once a daily he noticed his weight to slightly increase again.  201 today, 195 last visit polycythemic -  On phlebotomy CT chest 02/2013 shows decrease in size of nodule from 7mm to 5mm Does not like oximiser much since heavy  08/21/2013 Acute OV Pt c/o prod cough with mucous tinted with blood X3 days.  Low grade fever   intermittently X3 days, some SOB and wheezing with exertion. Developed fever and chills on 11/21 seen by PCP . Flu swab was neg. Rec supportive care.  On 11/22 noticed had some blood tinged mucus mixed with clear mucus . Had mild wheezing and increased dypsnea.  Yesterday symptoms started to improve. No fever x 3 days.  Minimal cough, not increased from baseline. Says he feels fine now just wanted to get checked out because he saw blood in mucus.  Currently on Aspirin 160 mg daily . Minimal blood tinged mucus this am.  He denies chest pain, orthopnea, edema , calf pain, exertional or pleuritic chest pain.  Remains on Advair and Spiriva .  O2 sats adequate on 4 l/m at rest  And 8 L /m with act - baseline setting.  No recent travel or abx. Use   He did undergo cystoscopy in Sept , found to have a noninvasive bladder tumor. This was resected. 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 Is following with Dr. Vernie Ammons.   Continues to follow with Dr. Myna Hidalgo for polycythemia with phlebotomy.    Past Medical History  Diagnosis Date  . Chronic airway obstruction, not elsewhere classified   . Unspecified essential hypertension   .  Cerebrovascular disease, unspecified   . Other and unspecified hyperlipidemia   . Diaphragmatic hernia without mention of obstruction or gangrene   . Diverticulosis of colon (without mention of hemorrhage)   . Benign neoplasm of colon   . Overweight(278.02)   . On home oxygen therapy   . Right leg pain 01/18/2013  . H/O tobacco use, presenting hazards to health 01/25/2013  . Edema 04/18/2013  . OSA on CPAP   . Peripheral vascular disease     hx of DVT stopped Coumadin 06/05/2013   . Cancer     kidney cancer   . Arthritis   . Polycythemia   . DVT (deep venous thrombosis)     2014   . Pulmonary embolism   . 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 Also denies presyncope, palpitations, heartburn, abdominal pain, nausea, vomiting, diarrhea or change in bowel or urinary habits, dysuria,hematuria, rash, arthralgias, visual complaints, headache, numbness weakness or ataxia.     Objective:   Physical Exam   Gen. Pleasant, well-nourished, in no distress, normal affect ENT - no lesions, no post nasal drip Neck: No JVD, no thyromegaly, no carotid bruits Lungs: no use of accessory muscles, no dullness to percussion, decreased without rales or rhonchi  Cardiovascular: Rhythm regular, heart sounds  normal, no murmurs or gallops, no peripheral edema, neg homans sign  Abdomen: soft and non-tender, no hepatosplenomegaly, BS normal. Musculoskeletal: No deformities, no cyanosis or clubbing Neuro:  alert, non focal       Assessment & Plan:

## 2013-08-21 NOTE — Assessment & Plan Note (Addendum)
Mild flare with suspected URI now resolving  Exam is unrevealing. He does have a hx of PE/DVT 2013  with last dopplers in 03/2013 neg for DVT and coumadin stopped 05/2013 .  Hx is neg for chest pain, calf pain and sats are adequate on baseline setting without increased demand.  Will check xray today. If hemoptysis does not resolve or other symptoms develop will need further investigation.   Plan  Mucinex DM Twice daily  As needed  Cough/congestion  Chest xray today .  Continue on advair/ spiriva Please contact office for sooner follow up if symptoms do not improve or worsen or seek emergency care  Follow up Dr. Vassie Loll  In 1 month at [facility] Location

## 2013-09-03 NOTE — Progress Notes (Signed)
CC:   Danise Edge, MD  DIAGNOSES: 1. Secondary polycythemia - severe chronic obstructive pulmonary     disease. 2. Pulmonary embolism/deep vein thrombosis - idiopathic.  CURRENT THERAPY: 1. Phlebotomy to maintain hematocrit below 45%. 2. The patient recently stopped Coumadin and will start aspirin at 162     mg p.o. daily after his cystoscopy on 09/29.  INTERIM HISTORY:  Mr. Gropp comes in for followup.  He is doing okay. He is having a cystoscopy on September 29th.  He has a small bladder tumor.  He is off Coumadin now.  He was taken off Coumadin by, I think, the pulmonologist.  He was on it for probably about 9 months.  He did not have any evidence of residual PE or DVT on recent studies.  He has done well with his phlebotomies.  We are getting the iron out of him.  Back in July, his ferritin was down to 27% with an iron saturation of 26%.  He has had no problems with nausea or vomiting.  He still wears his oxygen all the time.  He has had no exacerbations of his COPD.  He has some occasional leg swelling.  He has not noted anything that has been permanent.  His DVT was in the right leg.  This seems to swell up a little bit more than the other leg, but it does go down when he lies down.  He has had no bleeding.  He has had no mouth sores.  He has had no headache.  PHYSICAL EXAMINATION:  General:  This is an elderly white gentleman, in no obvious distress.  Vital Signs:  Temperature of 97.8, pulse 70, respiratory rate 18, blood pressure 116/65.  Weight is 203.  Head and Neck:  Normocephalic, atraumatic skull.  There are no ocular or oral lesions.  There are no palpable cervical or supraclavicular lymph nodes. Lungs:  With decreased breath sounds throughout all lung fields. Cardiac:  Regular rate and rhythm with a normal S1 and S2.  There are no murmurs, rubs, or bruits.  Abdomen:  Soft.  He has good bowel sounds. There is no palpable abdominal mass.  There is no  palpable hepatosplenomegaly.  Extremities:  Some mild nonpitting edema of his lower legs.  He has good pulses.  No obvious venous cord noted in the legs.  LABORATORY STUDIES:  White cell count 7.9, hemoglobin 14.9, hematocrit 46.8, platelet count 197.  IMPRESSION:  Mr. Dillinger is an 79 year old gentleman.  He had a pulmonary embolus and deep venous thrombosis of the right leg back in December 2013.  He is off Coumadin now.  I do not see any problems with him having his surgery for the cystoscopy next Monday.  I do think that we should probably phlebotomize him.  This actually might help him a little bit.  We will plan to get him back in another two months.  Again, I told him to take two baby aspirins a day.  I told him to make sure that he takes it with food.    ______________________________ Josph Macho, M.D. PRE/MEDQ  D:  06/21/2013  T:  09/02/2013  Job:  [id number]

## 2013-10-02 ENCOUNTER — Telehealth: Payer: Self-pay | Admitting: Family Medicine

## 2013-10-02 DIAGNOSIS — E785 Hyperlipidemia, unspecified: Secondary | ICD-10-CM

## 2013-10-02 DIAGNOSIS — I10 Essential (primary) hypertension: Secondary | ICD-10-CM

## 2013-10-02 NOTE — Telephone Encounter (Signed)
Please advise 

## 2013-10-02 NOTE — Telephone Encounter (Signed)
Patient is scheduled for cpe with Dr B on 10-10-2013.  He has to see Dr Marin Olp this week and would like to have his labs for his cpe done at the same time.  Dr Marin Olp always takes blood  Please advise patient

## 2013-10-03 NOTE — Telephone Encounter (Signed)
Needs lipid, renal, cbc, tsh, hepatic, for HTN and hyperlipidemia

## 2013-10-04 ENCOUNTER — Ambulatory Visit (HOSPITAL_BASED_OUTPATIENT_CLINIC_OR_DEPARTMENT_OTHER): Payer: Medicare Other

## 2013-10-04 ENCOUNTER — Other Ambulatory Visit (HOSPITAL_BASED_OUTPATIENT_CLINIC_OR_DEPARTMENT_OTHER): Payer: Medicare Other | Admitting: Lab

## 2013-10-04 ENCOUNTER — Ambulatory Visit (HOSPITAL_BASED_OUTPATIENT_CLINIC_OR_DEPARTMENT_OTHER): Payer: Medicare Other | Admitting: Hematology & Oncology

## 2013-10-04 VITALS — BP 134/57 | HR 81 | Temp 97.5°F | Resp 20 | Ht 70.0 in | Wt 214.0 lb

## 2013-10-04 VITALS — BP 118/69 | HR 69 | Resp 20

## 2013-10-04 DIAGNOSIS — D45 Polycythemia vera: Secondary | ICD-10-CM

## 2013-10-04 DIAGNOSIS — D751 Secondary polycythemia: Secondary | ICD-10-CM

## 2013-10-04 LAB — CBC WITH DIFFERENTIAL (CANCER CENTER ONLY)
BASO#: 0.1 10*3/uL (ref 0.0–0.2)
BASO%: 0.6 % (ref 0.0–2.0)
EOS%: 4.5 % (ref 0.0–7.0)
Eosinophils Absolute: 0.5 10*3/uL (ref 0.0–0.5)
HCT: 46.8 % (ref 38.7–49.9)
HGB: 14.7 g/dL (ref 13.0–17.1)
LYMPH#: 1.3 10*3/uL (ref 0.9–3.3)
LYMPH%: 12.4 % — ABNORMAL LOW (ref 14.0–48.0)
MCH: 29.2 pg (ref 28.0–33.4)
MCHC: 31.4 g/dL — AB (ref 32.0–35.9)
MCV: 93 fL (ref 82–98)
MONO#: 1.3 10*3/uL — ABNORMAL HIGH (ref 0.1–0.9)
MONO%: 11.9 % (ref 0.0–13.0)
NEUT#: 7.4 10*3/uL — ABNORMAL HIGH (ref 1.5–6.5)
NEUT%: 70.6 % (ref 40.0–80.0)
PLATELETS: 216 10*3/uL (ref 145–400)
RBC: 5.04 10*6/uL (ref 4.20–5.70)
RDW: 15 % (ref 11.1–15.7)
WBC: 10.5 10*3/uL — ABNORMAL HIGH (ref 4.0–10.0)

## 2013-10-04 NOTE — Progress Notes (Signed)
Tracy [name] presents today for phlebotomy per MD orders. Phlebotomy procedure started at 1158 and ended at 1207 Approximately 500 mls removed. Patient observed for 30 minutes after procedure without any incident. Patient tolerated procedure well. IV needle removed intact.

## 2013-10-04 NOTE — Telephone Encounter (Signed)
Lab order placed and pt informed. Pt states he did labs at Dr Dicie Beam office today so he will come to our office on 10-09-13 to do labs

## 2013-10-04 NOTE — Patient Instructions (Signed)

## 2013-10-04 NOTE — Progress Notes (Signed)
This office note has been dictated.

## 2013-10-04 NOTE — Progress Notes (Deleted)
Tracy [name] presents today for phlebotomy per MD orders. Phlebotomy procedure started at  and ended at 1207 500 grams removed. Patient observed for 30 minutes after procedure without any incident. Patient tolerated procedure well. IV needle removed intact.

## 2013-10-05 NOTE — Progress Notes (Signed)
DIAGNOSES: 1. Secondary polycythemia. 2. Idiopathic pulmonary embolism/deep venous thrombosis of the right     leg.  CURRENT THERAPY: 1. Phlebotomy to maintain hematocrit below 45%. 2. Aspirin 162 mg p.o. daily.  INTERIM HISTORY:  [name] comes in for followup.  He is doing okay. He is oxygen dependent.  He has pretty bad lung disease.  He did see Dr. Karsten Ro of Urology.  He did have a cystoscopy.  A low- grade papillary carcinoma was noted.  This was removed.  Overall, [name] has been doing pretty well.  He has had no problems with headache.  He has had no increasing shortness of breath.  He has had no fever, sweats, or chills.  So far, he is __________ of the flu.  When we last saw him in November, his ferritin was 29 with iron saturation of 20%.  He has had no change in his medications.  PHYSICAL EXAMINATION:  General:  This is a well-developed, well- nourished white gentleman in no obvious distress.  Vital Signs: Temperature of 97.5, pulse 81, respiratory rate 20, blood pressure 134/57.  Weight is 214 pounds.  Head and Neck:  Normocephalic, atraumatic skull.  There are no ocular or oral lesions.  There are no palpable cervical or supraclavicular lymph nodes.  Lungs:  Clear bilaterally.  Cardiac:  Regular rate and rhythm with a normal S1, S2. There are no murmurs, rubs, or bruits.  Abdomen:  Soft.  He is some obese.  He has decent bowel sounds.  There is no palpable abdominal mass.  There is no palpable hepatosplenomegaly.  Extremities:  No clubbing, cyanosis, or edema.  There is some slight nonpitting edema of the right lower leg.  LABORATORY STUDIES:  White cell count is 10.5, hemoglobin 14.7, hematocrit 46.8, platelet count 216.  IMPRESSION:  [name] is an 80 year old gentleman with history of pulmonary embolism and deep venous thrombosis of the right leg.  He was treated with Coumadin.  I think, he was on Coumadin for 1 year.  Again, he is on aspirin.  I  do not see any problems with respect to hypercoagulability.  We will go ahead and phlebotomize him.  Again, I want to try to get his hematocrit below 45%.  We will plan to get him back in another month or so, so we can continue to monitor his hemoglobin and hematocrit, and keep this below 45.    ______________________________ Volanda Napoleon, M.D. PRE/MEDQ  D:  10/04/2013  T:  10/05/2013  Job:  [id number]

## 2013-10-09 LAB — CBC
HCT: 39.5 % (ref 39.0–52.0)
Hemoglobin: 13 g/dL (ref 13.0–17.0)
MCH: 29.1 pg (ref 26.0–34.0)
MCHC: 32.9 g/dL (ref 30.0–36.0)
MCV: 88.4 fL (ref 78.0–100.0)
PLATELETS: 213 10*3/uL (ref 150–400)
RBC: 4.47 MIL/uL (ref 4.22–5.81)
RDW: 14.9 % (ref 11.5–15.5)
WBC: 8 10*3/uL (ref 4.0–10.5)

## 2013-10-09 LAB — HEPATIC FUNCTION PANEL
ALT: 18 U/L (ref 0–53)
AST: 22 U/L (ref 0–37)
Albumin: 3.8 g/dL (ref 3.5–5.2)
Alkaline Phosphatase: 54 U/L (ref 39–117)
BILIRUBIN DIRECT: 0.1 mg/dL (ref 0.0–0.3)
BILIRUBIN INDIRECT: 0.3 mg/dL (ref 0.0–0.9)
TOTAL PROTEIN: 6.3 g/dL (ref 6.0–8.3)
Total Bilirubin: 0.4 mg/dL (ref 0.3–1.2)

## 2013-10-09 LAB — RENAL FUNCTION PANEL
ALBUMIN: 3.8 g/dL (ref 3.5–5.2)
BUN: 24 mg/dL — ABNORMAL HIGH (ref 6–23)
CHLORIDE: 103 meq/L (ref 96–112)
CO2: 32 mEq/L (ref 19–32)
CREATININE: 1.46 mg/dL — AB (ref 0.50–1.35)
Calcium: 8.8 mg/dL (ref 8.4–10.5)
GLUCOSE: 91 mg/dL (ref 70–99)
Phosphorus: 3.6 mg/dL (ref 2.3–4.6)
Potassium: 4.1 mEq/L (ref 3.5–5.3)
Sodium: 141 mEq/L (ref 135–145)

## 2013-10-09 LAB — LIPID PANEL
Cholesterol: 136 mg/dL (ref 0–200)
HDL: 42 mg/dL (ref >39)
LDL Cholesterol: 63 mg/dL (ref 0–99)
TRIGLYCERIDES: 156 mg/dL — AB (ref <150)
Total CHOL/HDL Ratio: 3.2 Ratio
VLDL: 31 mg/dL (ref 0–40)

## 2013-10-09 LAB — TSH: TSH: 3.238 u[IU]/mL (ref 0.350–4.500)

## 2013-10-11 ENCOUNTER — Encounter: Payer: Self-pay | Admitting: Pulmonary Disease

## 2013-10-11 ENCOUNTER — Ambulatory Visit (INDEPENDENT_AMBULATORY_CARE_PROVIDER_SITE_OTHER): Payer: Medicare Other | Admitting: Family Medicine

## 2013-10-11 ENCOUNTER — Encounter: Payer: Self-pay | Admitting: Family Medicine

## 2013-10-11 ENCOUNTER — Ambulatory Visit (HOSPITAL_BASED_OUTPATIENT_CLINIC_OR_DEPARTMENT_OTHER)
Admission: RE | Admit: 2013-10-11 | Discharge: 2013-10-11 | Disposition: A | Discharge status: Home / Self Care | Payer: Medicare Other | Source: Ambulatory Visit | Attending: Family Medicine | Admitting: Family Medicine

## 2013-10-11 ENCOUNTER — Ambulatory Visit (INDEPENDENT_AMBULATORY_CARE_PROVIDER_SITE_OTHER): Payer: Medicare Other | Admitting: Pulmonary Disease

## 2013-10-11 VITALS — BP 122/70 | HR 87 | Temp 98.0°F | Wt 211.0 lb

## 2013-10-11 VITALS — BP 110/58 | HR 85 | Temp 97.8°F | Ht 70.0 in | Wt 211.1 lb

## 2013-10-11 DIAGNOSIS — I10 Essential (primary) hypertension: Secondary | ICD-10-CM

## 2013-10-11 DIAGNOSIS — R0602 Shortness of breath: Secondary | ICD-10-CM

## 2013-10-11 DIAGNOSIS — J449 Chronic obstructive pulmonary disease, unspecified: Secondary | ICD-10-CM | POA: Insufficient documentation

## 2013-10-11 DIAGNOSIS — R609 Edema, unspecified: Secondary | ICD-10-CM

## 2013-10-11 DIAGNOSIS — E785 Hyperlipidemia, unspecified: Secondary | ICD-10-CM

## 2013-10-11 DIAGNOSIS — R0989 Other specified symptoms and signs involving the circulatory and respiratory systems: Secondary | ICD-10-CM

## 2013-10-11 DIAGNOSIS — J4489 Other specified chronic obstructive pulmonary disease: Secondary | ICD-10-CM | POA: Insufficient documentation

## 2013-10-11 DIAGNOSIS — Z0000 Encounter for general adult medical examination without abnormal findings: Secondary | ICD-10-CM

## 2013-10-11 DIAGNOSIS — J984 Other disorders of lung: Secondary | ICD-10-CM | POA: Insufficient documentation

## 2013-10-11 DIAGNOSIS — I359 Nonrheumatic aortic valve disorder, unspecified: Secondary | ICD-10-CM

## 2013-10-11 DIAGNOSIS — R911 Solitary pulmonary nodule: Secondary | ICD-10-CM

## 2013-10-11 MED ORDER — LOSARTAN POTASSIUM 25 MG PO TABS: 12.5000 mg | ORAL_TABLET | Freq: Every day | ORAL | Status: DC

## 2013-10-11 NOTE — Progress Notes (Signed)
Subjective:    Patient ID: Tracy [name], male    DOB: May 26, 1926, 80 y.o.   MRN: [id number]  HPI  PCP- [facility]   80 y/o WM for FU of OSA; COPD, severe hypoxia & new pulmonary nodule noted dec'13  H/o sub segmental PE/ RLE DVT in dec 13   Data/ Events :  OBSTRUCTIVE SLEEP APNEA - ~ sleep study 3/06 w/ RDI 26 w/ desat to 76%...  COPD - on home O2 .  ~ PFT 11/04 showed FVC=4.51 (103%), FEV1=1.93 (67%) and DLCO~50%...  ~ CT Chest 9/05 & CT abd 1/12 showed marked emphysema w/ blebs, & thor spondylosis...  ~ Office spirometry 11/05 showed FEV1=1.57 (48%)...  ~ PFT 4/10 showed FVC= 3.00 (77%), FEV1= 1.37 (45%)  ~02/21/13 Echo with Bubble study >> mild LVH, EF 60 to 65%, mild AS, PAS 51 mmHg, no PFO  ~Oximizer added 02/23/13 for desats >5 /rest , 8l/m act/qhs  Hospitalised 09/26/12 for worsening hypoxia  Ct angio showed Subsegmental acute pulmonary thromboembolism in the right lower lobe, 7 mm spiculated lung lesion in the right middle lobe & bullous emphysema  DUplex rt popliteal DVt noted -Treated with coumadin x12 mnths  CT chest 02/2013 shows decrease in size of nodule from 33mm to 48mm  PFTs -12/13- fev1 52% (1.30 ) -unchanged from last  DLCO 29%  ONO on cpap + 3L O2 showed drop in satn x 2h   Readmitted 01/2013 for cor pulmonale -improved with diuresis     10/11/2013  08/21/2013 Acute OV -mild hemoptysis-resolved spontaneously  Does not like oximiser much since heavy  cxr no changes,scarring RLL He did undergo cystoscopy in Sept , found to have a noninvasive bladder tumor. This was resected. 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 Is following with Dr. Karsten Ro.  Continues to follow with Dr. Marin Olp for polycythemia with phlebotomy.  He denies chest pain, orthopnea, edema , calf pain, exertional or pleuritic chest pain.  Remains on Advair and Spiriva .  O2 sats adequate on 4 l/m at rest And upto  8 L /m with act - baseline setting.  No recent travel or abx.   Past Medical  History  Diagnosis Date  . Chronic airway obstruction, not elsewhere classified   . Unspecified essential hypertension   . Cerebrovascular disease, unspecified   . Other and unspecified hyperlipidemia   . Diaphragmatic hernia without mention of obstruction or gangrene   . Diverticulosis of colon (without mention of hemorrhage)   . Benign neoplasm of colon   . Overweight   . On home oxygen therapy   . Right leg pain 01/18/2013  . H/O tobacco use, presenting hazards to health 01/25/2013  . Edema 04/18/2013  . OSA on CPAP   . Peripheral vascular disease     hx of DVT stopped Coumadin 06/05/2013   . Cancer     kidney cancer   . Arthritis   . Polycythemia   . DVT (deep venous thrombosis)     2014   . Pulmonary embolism   . 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 Also denies presyncope, palpitations, heartburn, abdominal pain, nausea, vomiting, diarrhea or change in bowel or urinary habits, dysuria,hematuria, rash, arthralgias, visual complaints, headache, numbness weakness or ataxia.     Objective:  Physical Exam  Gen. Pleasant, well-nourished, in no distress ENT - no lesions, no post nasal drip Neck: No JVD, no thyromegaly, no carotid bruits Lungs: no use of accessory muscles, no dullness to percussion, clear without rales or rhonchi  Cardiovascular: Rhythm regular, heart sounds  normal, no murmurs or gallops, no peripheral edema Musculoskeletal: No deformities, no cyanosis or clubbing        Assessment & Plan:

## 2013-10-11 NOTE — Patient Instructions (Signed)
Oxygen saturation target is 88% Please use oximiser Stay on advair & spiriva

## 2013-10-11 NOTE — Patient Instructions (Signed)
DASH Diet The DASH diet stands for "Dietary Approaches to Stop Hypertension." It is a healthy eating plan that has been shown to reduce high blood pressure (hypertension) in as little as 14 days, while also possibly providing other significant health benefits. These other health benefits include reducing the risk of breast cancer after menopause and reducing the risk of type 2 diabetes, heart disease, colon cancer, and stroke. Health benefits also include weight loss and slowing kidney failure in patients with chronic kidney disease.  DIET GUIDELINES  Limit salt (sodium). Your diet should contain less than 1500 mg of sodium daily.  Limit refined or processed carbohydrates. Your diet should include mostly whole grains. Desserts and added sugars should be used sparingly.  Include small amounts of heart-healthy fats. These types of fats include nuts, oils, and tub margarine. Limit saturated and trans fats. These fats have been shown to be harmful in the body. CHOOSING FOODS  The following food groups are based on a 2000 calorie diet. See your Registered Dietitian for individual calorie needs. Grains and Grain Products (6 to 8 servings daily)  Eat More Often: Whole-wheat bread, brown rice, whole-grain or wheat pasta, quinoa, popcorn without added fat or salt (air popped).  Eat Less Often: White bread, white pasta, white rice, cornbread. Vegetables (4 to 5 servings daily)  Eat More Often: Fresh, frozen, and canned vegetables. Vegetables may be raw, steamed, roasted, or grilled with a minimal amount of fat.  Eat Less Often/Avoid: Creamed or fried vegetables. Vegetables in a cheese sauce. Fruit (4 to 5 servings daily)  Eat More Often: All fresh, canned (in natural juice), or frozen fruits. Dried fruits without added sugar. One hundred percent fruit juice ( cup [237 mL] daily).  Eat Less Often: Dried fruits with added sugar. Canned fruit in light or heavy syrup. [employer], Fish, and Poultry (2  servings or less daily. One serving is 3 to 4 oz [85-114 g]).  Eat More Often: Ninety percent or leaner ground beef, tenderloin, sirloin. Round cuts of beef, chicken breast, [place] breast. All fish. Grill, bake, or broil your meat. Nothing should be fried.  Eat Less Often/Avoid: Fatty cuts of meat, [place], or chicken leg, thigh, or wing. Fried cuts of meat or fish. Dairy (2 to 3 servings)  Eat More Often: Low-fat or fat-free milk, low-fat plain or light yogurt, reduced-fat or part-skim cheese.  Eat Less Often/Avoid: Milk (whole, 2%).Whole milk yogurt. Full-fat cheeses. Nuts, Seeds, and Legumes (4 to 5 servings per week)  Eat More Often: All without added salt.  Eat Less Often/Avoid: Salted nuts and seeds, canned beans with added salt. Fats and Sweets (limited)  Eat More Often: Vegetable oils, tub margarines without trans fats, sugar-free gelatin. Mayonnaise and salad dressings.  Eat Less Often/Avoid: Coconut oils, palm oils, butter, stick margarine, cream, half and half, cookies, candy, pie. FOR MORE INFORMATION The Dash Diet Eating Plan: www.dashdiet.org Document Released: 09/02/2011 Document Revised: 12/06/2011 Document Reviewed: 09/02/2011 [facility] Patient Information 2014 [place], [place]. Edema Edema is an abnormal build-up of fluids in tissues. Because this is partly dependent on gravity (water flows to the lowest place), it is more common in the legs and thighs (lower extremities). It is also common in the looser tissues, like around the eyes. Painless swelling of the feet and ankles is common and increases as a person ages. It may affect both legs and may include the calves or even thighs. When squeezed, the fluid may move out of the affected area and may leave  a dent for a few moments. CAUSES   Prolonged standing or sitting in one place for extended periods of time. Movement helps pump tissue fluid into the veins, and absence of movement prevents this, resulting in  edema.  Varicose veins. The valves in the veins do not work as well as they should. This causes fluid to leak into the tissues.  Fluid and salt overload.  Injury, burn, or surgery to the leg, ankle, or foot, may damage veins and allow fluid to leak out.  Sunburn damages vessels. Leaky vessels allow fluid to go out into the sunburned tissues.  Allergies (from insect bites or stings, medications or chemicals) cause swelling by allowing vessels to become leaky.  Protein in the blood helps keep fluid in your vessels. Low protein, as in malnutrition, allows fluid to leak out.  Hormonal changes, including pregnancy and menstruation, cause fluid retention. This fluid may leak out of vessels and cause edema.  Medications that cause fluid retention. Examples are sex hormones, blood pressure medications, steroid treatment, or anti-depressants.  Some illnesses cause edema, especially heart failure, kidney disease, or liver disease.  Surgery that cuts veins or lymph nodes, such as surgery done for the heart or for breast cancer, may result in edema. DIAGNOSIS  Your caregiver is usually easily able to determine what is causing your swelling (edema) by simply asking what is wrong (getting a history) and examining you (doing a physical). Sometimes x-rays, EKG (electrocardiogram or heart tracing), and blood work may be done to evaluate for underlying medical illness. TREATMENT  General treatment includes:  Leg elevation (or elevation of the affected body part).  Restriction of fluid intake.  Prevention of fluid overload.  Compression of the affected body part. Compression with elastic bandages or support stockings squeezes the tissues, preventing fluid from entering and forcing it back into the blood vessels.  Diuretics (also called water pills or fluid pills) pull fluid out of your body in the form of increased urination. These are effective in reducing the swelling, but can have side effects and  must be used only under your caregiver's supervision. Diuretics are appropriate only for some types of edema. The specific treatment can be directed at any underlying causes discovered. Heart, liver, or kidney disease should be treated appropriately. HOME CARE INSTRUCTIONS   Elevate the legs (or affected body part) above the level of the heart, while lying down.  Avoid sitting or standing still for prolonged periods of time.  Avoid putting anything directly under the knees when lying down, and do not wear constricting clothing or garters on the upper legs.  Exercising the legs causes the fluid to work back into the veins and lymphatic channels. This may help the swelling go down.  The pressure applied by elastic bandages or support stockings can help reduce ankle swelling.  A low-salt diet may help reduce fluid retention and decrease the ankle swelling.  Take any medications exactly as prescribed. SEEK MEDICAL CARE IF:  Your edema is not responding to recommended treatments. SEEK IMMEDIATE MEDICAL CARE IF:   You develop shortness of breath or chest pain.  You cannot breathe when you lay down; or if, while lying down, you have to get up and go to the window to get your breath.  You are having increasing swelling without relief from treatment.  You develop a fever over 102 F (38.9 C).  You develop pain or redness in the areas that are swollen.  Tell your caregiver right away if you  have gained 03 lb/1.4 kg in 1 day or 05 lb/2.3 kg in a week. MAKE SURE YOU:   Understand these instructions.  Will watch your condition.  Will get help right away if you are not doing well or get worse. Document Released: 09/13/2005 Document Revised: 03/14/2012 Document Reviewed: 05/01/2008 [facility] Patient Information 2014 [place].

## 2013-10-11 NOTE — Progress Notes (Signed)
Pre visit review using our clinic review tool, if applicable. No additional management support is needed unless otherwise documented below in the visit note. 

## 2013-10-14 ENCOUNTER — Encounter: Payer: Self-pay | Admitting: Family Medicine

## 2013-10-14 DIAGNOSIS — R0602 Shortness of breath: Secondary | ICD-10-CM | POA: Insufficient documentation

## 2013-10-14 DIAGNOSIS — I359 Nonrheumatic aortic valve disorder, unspecified: Secondary | ICD-10-CM | POA: Insufficient documentation

## 2013-10-14 DIAGNOSIS — Z0000 Encounter for general adult medical examination without abnormal findings: Secondary | ICD-10-CM | POA: Insufficient documentation

## 2013-10-14 NOTE — Assessment & Plan Note (Signed)
Lives at [facility] with his wife, he is still able to participate in self care. 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 No recent falls or depression. Has advanced directives. Patient and family will bring in copy.

## 2013-10-14 NOTE — Assessment & Plan Note (Signed)
Tolerating Simvastatin, well controlled no changes

## 2013-10-14 NOTE — Assessment & Plan Note (Signed)
Using Lasix prn with good results

## 2013-10-14 NOTE — Assessment & Plan Note (Signed)
Slight rhonchi noted today, is following with pulmonology.

## 2013-10-14 NOTE — Progress Notes (Signed)
Patient ID: Tracy [name], male   DOB: Jan 31, 1926, 80 y.o.   MRN: [id number] STEPHAN STRANZ [id number] 03/01/1926 10/14/2013      Progress Note-Follow Up  Subjective  Chief Complaint  Chief Complaint  Patient presents with  . Annual Exam    physical- saw eye dr 6 months ago. goes reg.    HPI  -year-old Caucasian male who is in today for routine medical care. He is accompanied by his wife. [place]. He denies difficulty with his ADLs. Is able to do self-care and eats well. Has been struggling with worsening shortness of breath and some congestion for quite some time. Denies chest pain or palpitations. No fevers or chills. No GI or GU complaints. Taking medications as prescribed. No falls.   Past Medical History  Diagnosis Date  . Chronic airway obstruction, not elsewhere classified   . Unspecified essential hypertension   . Cerebrovascular disease, unspecified   . Other and unspecified hyperlipidemia   . Diaphragmatic hernia without mention of obstruction or gangrene   . Diverticulosis of colon (without mention of hemorrhage)   . Benign neoplasm of colon   . Overweight   . On home oxygen therapy   . Right leg pain 01/18/2013  . H/O tobacco use, presenting hazards to health 01/25/2013  . Edema 04/18/2013  . OSA on CPAP   . Peripheral vascular disease     hx of DVT stopped Coumadin 06/05/2013   . Cancer     kidney cancer   . Arthritis   . Polycythemia   . DVT (deep venous thrombosis)     2014   . Pulmonary embolism   . 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 Hernia repair    . Nephroureterectomy    . Blasdder tumor removed     . Transurethral resection of bladder tumor N/A 06/25/2013    Procedure: TRANSURETHRAL RESECTION OF BLADDER TUMOR (TURBT);  Surgeon: Garnett Farm, MD;  Location: [facility];  Service: Urology;  Laterality: N/A;  . Hydrocele excision Right 06/25/2013    Procedure: HYDROCELECTOMY ADULT;   Surgeon: Garnett Farm, MD;  Location: [facility];  Service: Urology;  Laterality: Right;    Family History  Problem Relation Age of Onset  . Cancer Mother     colon with mets  . COPD Father   . Hypertension Father   . Arthritis Sister   . Other Son     h/o cystitis    History   Social History  . Marital Status: Married    Spouse Name: Barbette Or    Number of Children: 3  . Years of Education: N/A   Occupational History  .     Social History Main Topics  . Smoking status: Former Smoker -- 1.50 packs/day    Types: Cigarettes    Quit date: 09/27/1978  . Smokeless tobacco: Never Used  . Alcohol Use: No  . Drug Use: No  . Sexual Activity: No   Other Topics Concern  . Not on file   Social History Narrative  . No narrative on file    Current Outpatient Prescriptions on File Prior to Visit  Medication Sig Dispense Refill  . acetaminophen (TYLENOL ARTHRITIS PAIN) 650 MG CR tablet Take 650 mg by mouth every morning.       Marland Kitchen ADVAIR DISKUS 250-50 MCG/DOSE AEPB IHALE ONE PUFF TWICE DAILY  60 each  3  . albuterol (PROVENTIL, VENTOLIN) (5  MG/ML) 0.5% NEBU Take by nebulization as needed.      Marland Kitchen aspirin 81 MG tablet Take 81 mg by mouth 2 (two) times daily.       . furosemide (LASIX) 40 MG tablet Take 1 tablet in the AM and 1/2 tablet in the evening  45 tablet  6  . guaiFENesin (MUCINEX) 600 MG 12 hr tablet Take 1,200 mg by mouth 2 (two) times daily.       . Omega-3 Fatty Acids (FISH OIL) 1200 MG CAPS Take 1,200 mg by mouth every morning.      . psyllium (METAMUCIL) 58.6 % packet Take 1 packet by mouth daily.      . simvastatin (ZOCOR) 40 MG tablet TAKE ONE (1) TABLET EACH DAY  30 tablet  1  . SPIRIVA HANDIHALER 18 MCG inhalation capsule INHALE CONTENTS OF ONE CAPSULE DAILY  30 capsule  3   No current facility-administered medications on file prior to visit.    Allergies  Allergen Reactions  . Sulfamethoxazole Other (See Comments)    REACTION: "washes" him out per pt     Review of Systems  Review of Systems  Constitutional: Negative for fever and malaise/fatigue.  HENT: Negative for congestion.   Eyes: Negative for discharge.  Respiratory: Negative for shortness of breath.   Cardiovascular: Negative for chest pain, palpitations and leg swelling.  Gastrointestinal: Negative for nausea, abdominal pain and diarrhea.  Genitourinary: Negative for dysuria.  Musculoskeletal: Negative for falls.  Skin: Negative for rash.  Neurological: Negative for loss of consciousness and headaches.  Endo/Heme/Allergies: Negative for polydipsia.  Psychiatric/Behavioral: Negative for depression and suicidal ideas. The patient is not nervous/anxious and does not have insomnia.     Objective  BP 110/58  Pulse 85  Temp(Src) 97.8 F (36.6 C) (Oral)  Ht 5\' 10"  (1.778 m)  Wt 211 lb 1.9 oz (95.763 kg)  BMI 30.29 kg/m2  SpO2 90%  Physical Exam  Physical Exam  Constitutional: He is oriented to person, place, and time and well-developed, well-nourished, and in no distress. No distress.  HENT:  Head: Normocephalic and atraumatic.  Eyes: Conjunctivae are normal.  Neck: Neck supple. No thyromegaly present.  Cardiovascular: Normal rate and regular rhythm.   Murmur heard. Pulmonary/Chest: Effort normal and breath sounds normal. No respiratory distress.  Abdominal: He exhibits no distension and no mass. There is no tenderness.  Musculoskeletal: He exhibits no edema.  Neurological: He is alert and oriented to person, place, and time.  Skin: Skin is warm.  Psychiatric: Memory, affect and judgment normal.    Lab Results  Component Value Date   TSH 3.238 10/09/2013   Lab Results  Component Value Date   WBC 8.0 10/09/2013   HGB 13.0 10/09/2013   HCT 39.5 10/09/2013   MCV 88.4 10/09/2013   PLT 213 10/09/2013   Lab Results  Component Value Date   CREATININE 1.46* 10/09/2013   BUN 24* 10/09/2013   NA 141 10/09/2013   K 4.1 10/09/2013   CL 103 10/09/2013   CO2 32  10/09/2013   Lab Results  Component Value Date   ALT 18 10/09/2013   AST 22 10/09/2013   ALKPHOS 54 10/09/2013   BILITOT 0.4 10/09/2013   Lab Results  Component Value Date   CHOL 136 10/09/2013   Lab Results  Component Value Date   HDL 42 10/09/2013   Lab Results  Component Value Date   LDLCALC 63 10/09/2013   Lab Results  Component Value Date  TRIG 156* 10/09/2013   Lab Results  Component Value Date   CHOLHDL 3.2 10/09/2013     Assessment & Plan  HYPERTENSION, BORDERLINE Well controlled today  COPD Slight rhonchi noted today, is following with pulmonology.   HYPERLIPIDEMIA Tolerating Simvastatin, well controlled no changes  Obstruction to urinary outflow Has had numerous bladder tumors excised. Is doing well at present.  Edema Using Lasix prn with good results  Aortic valve disorders With increasing SOB, will refer to cardiology for further consideration. EKG unremarkable today.  Medicare annual wellness visit, subsequent Lives at [facility] with his wife, he is still able to participate in self care. 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 No recent falls or depression. Has advanced directives. Patient and family will bring in copy.

## 2013-10-14 NOTE — Assessment & Plan Note (Signed)
With increasing SOB, will refer to cardiology for further consideration. EKG unremarkable today.

## 2013-10-14 NOTE — Assessment & Plan Note (Signed)
Well controlled today.

## 2013-10-14 NOTE — Assessment & Plan Note (Signed)
Has had numerous bladder tumors excised. Is doing well at present.

## 2013-10-17 NOTE — Assessment & Plan Note (Signed)
Oxygen saturation target is 88% Please use oximiser Stay on advair & spiriva 

## 2013-10-17 NOTE — Assessment & Plan Note (Signed)
Will not pursue further imaging given drop in size of nodule- not a candidate for diagnostic procedures anyway

## 2013-10-22 ENCOUNTER — Other Ambulatory Visit: Payer: Self-pay | Admitting: Internal Medicine

## 2013-10-22 NOTE — Telephone Encounter (Signed)
Rx request to pharmacy/SLS  

## 2013-11-05 ENCOUNTER — Other Ambulatory Visit: Payer: Self-pay | Admitting: Pulmonary Disease

## 2013-11-12 ENCOUNTER — Other Ambulatory Visit: Payer: Self-pay | Admitting: Family Medicine

## 2013-11-21 ENCOUNTER — Encounter: Payer: Self-pay | Admitting: Cardiology

## 2013-11-21 ENCOUNTER — Ambulatory Visit (INDEPENDENT_AMBULATORY_CARE_PROVIDER_SITE_OTHER): Payer: Medicare Other | Admitting: Cardiology

## 2013-11-21 VITALS — BP 118/80 | HR 94 | Wt 205.0 lb

## 2013-11-21 DIAGNOSIS — R0989 Other specified symptoms and signs involving the circulatory and respiratory systems: Secondary | ICD-10-CM

## 2013-11-21 DIAGNOSIS — R0600 Dyspnea, unspecified: Secondary | ICD-10-CM | POA: Insufficient documentation

## 2013-11-21 DIAGNOSIS — R0609 Other forms of dyspnea: Secondary | ICD-10-CM

## 2013-11-21 DIAGNOSIS — I2609 Other pulmonary embolism with acute cor pulmonale: Secondary | ICD-10-CM

## 2013-11-21 DIAGNOSIS — I10 Essential (primary) hypertension: Secondary | ICD-10-CM

## 2013-11-21 DIAGNOSIS — E785 Hyperlipidemia, unspecified: Secondary | ICD-10-CM

## 2013-11-21 NOTE — Patient Instructions (Signed)
Your physician wants you to follow-up in: ONE YEAR WITH DR CRENSHAW IN [place] You will receive a reminder letter in the mail two months in advance. If you don't receive a letter, please call our office to schedule the follow-up appointment.  

## 2013-11-21 NOTE — Assessment & Plan Note (Signed)
Patient for evaluation of dyspnea. I think this is most likely related to his severe pulmonary disease given COPD, obstructive sleep apnea and previous pulmonary disease. He does have calcium noted in his coronaries on previous CT. Certainly coronary artery disease could be contributing but he is not having chest pain. I discussed potential stress test but we decided conservative measures for now given the likelihood that this is most likely pulmonary related. We will continue with his aspirin and statin. If he begins having chest pain we will reconsider the above.

## 2013-11-21 NOTE — Assessment & Plan Note (Signed)
Continue present blood pressure medications. 

## 2013-11-21 NOTE — Assessment & Plan Note (Signed)
Patient has a history of right heart failure. He is now euvolemic on examination. Continue present dose of Lasix.

## 2013-11-21 NOTE — Progress Notes (Signed)
HPI: 80 yo male for evaluation of dyspnea. Echo 5/14 showed normal LV function, mild AS with mean gradient 13 mmHg, moderately elevated pulmonary pressures. Also with home O2 dependent COPD. Pulmonary embolus on CT 12/13. PFTs 12/13 with FEV1 1.30; DLCO 29%. Chest CT 3/14, severe COPD with bullae, coronary and aortic valve calcification, right middle lobe nodule. Chest xray 1/15 with COPD. Labs 1/15 BUN/Cr 24/1.46. Patient has dyspnea on exertion but no orthopnea or PND. He has had pedal edema that is now controlled with Lasix. No chest pain, palpitations or syncope.  Current Outpatient Prescriptions  Medication Sig Dispense Refill  . acetaminophen (TYLENOL ARTHRITIS PAIN) 650 MG CR tablet Take 650 mg by mouth every morning.       Marland Kitchen ADVAIR DISKUS 250-50 MCG/DOSE AEPB IHALE ONE PUFF TWICE DAILY  60 each  3  . albuterol (PROVENTIL, VENTOLIN) (5 MG/ML) 0.5% NEBU Take by nebulization as needed.      Marland Kitchen aspirin 81 MG tablet Take 81 mg by mouth 2 (two) times daily.       . furosemide (LASIX) 40 MG tablet 1 tab po qd      . guaiFENesin (MUCINEX) 600 MG 12 hr tablet Take 1,200 mg by mouth 2 (two) times daily.       Marland Kitchen losartan (COZAAR) 25 MG tablet 1/2 tab po qd      . Omega-3 Fatty Acids (FISH OIL) 1200 MG CAPS Take 1,200 mg by mouth every morning.      . psyllium (METAMUCIL) 58.6 % packet Take 1 packet by mouth daily.      . simvastatin (ZOCOR) 40 MG tablet TAKE ONE (1) TABLET EACH DAY  30 tablet  1  . SPIRIVA HANDIHALER 18 MCG inhalation capsule USE 1 CAPSULE WITH HANDIHALER DEVICE ANDINHALE ONCE DAILY  30 capsule  2   No current facility-administered medications for this visit.    Allergies  Allergen Reactions  . Sulfamethoxazole Other (See Comments)    REACTION: "washes" him out per pt    Past Medical History  Diagnosis Date  . Chronic airway obstruction, not elsewhere classified   . Unspecified essential hypertension   . Cerebrovascular disease, unspecified   . Other and unspecified  hyperlipidemia   . Diaphragmatic hernia without mention of obstruction or gangrene   . Diverticulosis of colon (without mention of hemorrhage)   . Benign neoplasm of colon   . Overweight   . On home oxygen therapy   . H/O tobacco use, presenting hazards to health 01/25/2013  . Edema 04/18/2013  . OSA on CPAP   . Peripheral vascular disease     hx of DVT stopped Coumadin 06/05/2013   . Cancer     kidney cancer   . Arthritis   . Polycythemia   . DVT (deep venous thrombosis)     2014   . Pulmonary embolism   . 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 Hernia repair    . Nephroureterectomy    . Blasdder tumor removed     . Transurethral resection of bladder tumor N/A 06/25/2013    Procedure: TRANSURETHRAL RESECTION OF BLADDER TUMOR (TURBT);  Surgeon: Claybon Jabs, MD;  Location: [facility];  Service: Urology;  Laterality: N/A;  . Hydrocele excision Right 06/25/2013    Procedure: HYDROCELECTOMY ADULT;  Surgeon: Claybon Jabs, MD;  Location: [facility];  Service: Urology;  Laterality: Right;    History   Social History  .  Marital Status: Married    Spouse Name: Zelphia Cairo    Number of Children: 3  . Years of Education: N/A   Occupational History  .     Social History Main Topics  . Smoking status: Former Smoker -- 1.50 packs/day    Types: Cigarettes    Quit date: 09/27/1978  . Smokeless tobacco: Never Used  . Alcohol Use: No  . Drug Use: No  . Sexual Activity: No   Other Topics Concern  . Not on file   Social History Narrative  . No narrative on file    Family History  Problem Relation Age of Onset  . Cancer Mother     colon with mets  . COPD Father   . Hypertension Father   . Arthritis Sister   . Other Son     h/o cystitis    ROS: some arthritis in his hands and chronic productive cough but no fevers or chills, hemoptysis, dysphasia, odynophagia, melena, hematochezia, dysuria, hematuria, rash, seizure activity, orthopnea,  PND, claudication. Remaining systems are negative.  Physical Exam:   Blood pressure 118/80, pulse 94, weight 205 lb (92.987 kg).  General:  Well developed/obese in NAD Skin warm/dry Patient not depressed Positive peripheral clubbing Back-normal HEENT-normal/normal eyelids Neck supple/normal carotid upstroke bilaterally; no bruits; no JVD; no thyromegaly chest - diminished BS throughout CV - RRR/normal S1 and S2; no rubs or gallops;  PMI nondisplaced, 1/6 SEM Abdomen -NT/ND, no HSM, no mass, + bowel sounds, no bruit 1+ femoral pulses, no bruits Ext-no edema, chords, 2+ DP Neuro-grossly nonfocal  ECG 10/11/13-sinus rhythm, no ST changes

## 2013-11-21 NOTE — Assessment & Plan Note (Signed)
Continue statin. 

## 2013-11-29 ENCOUNTER — Ambulatory Visit (HOSPITAL_BASED_OUTPATIENT_CLINIC_OR_DEPARTMENT_OTHER): Payer: Medicare Other

## 2013-11-29 ENCOUNTER — Ambulatory Visit (HOSPITAL_BASED_OUTPATIENT_CLINIC_OR_DEPARTMENT_OTHER): Payer: Medicare Other | Admitting: Hematology & Oncology

## 2013-11-29 ENCOUNTER — Ambulatory Visit (HOSPITAL_BASED_OUTPATIENT_CLINIC_OR_DEPARTMENT_OTHER): Payer: Medicare Other | Admitting: Lab

## 2013-11-29 VITALS — BP 119/75 | HR 77 | Resp 20

## 2013-11-29 DIAGNOSIS — D751 Secondary polycythemia: Secondary | ICD-10-CM

## 2013-11-29 DIAGNOSIS — Z7982 Long term (current) use of aspirin: Secondary | ICD-10-CM

## 2013-11-29 DIAGNOSIS — Z86711 Personal history of pulmonary embolism: Secondary | ICD-10-CM

## 2013-11-29 LAB — CBC WITH DIFFERENTIAL (CANCER CENTER ONLY)
BASO#: 0 10*3/uL (ref 0.0–0.2)
BASO%: 0.4 % (ref 0.0–2.0)
EOS ABS: 0.4 10*3/uL (ref 0.0–0.5)
EOS%: 5.1 % (ref 0.0–7.0)
HEMATOCRIT: 47.4 % (ref 38.7–49.9)
HGB: 14.7 g/dL (ref 13.0–17.1)
LYMPH#: 1.4 10*3/uL (ref 0.9–3.3)
LYMPH%: 17 % (ref 14.0–48.0)
MCH: 28.3 pg (ref 28.0–33.4)
MCHC: 31 g/dL — ABNORMAL LOW (ref 32.0–35.9)
MCV: 91 fL (ref 82–98)
MONO#: 0.8 10*3/uL (ref 0.1–0.9)
MONO%: 10 % (ref 0.0–13.0)
NEUT#: 5.5 10*3/uL (ref 1.5–6.5)
NEUT%: 67.5 % (ref 40.0–80.0)
Platelets: 203 10*3/uL (ref 145–400)
RBC: 5.19 10*6/uL (ref 4.20–5.70)
RDW: 15.3 % (ref 11.1–15.7)
WBC: 8.2 10*3/uL (ref 4.0–10.0)

## 2013-11-29 NOTE — Progress Notes (Signed)
Gabriel Earing presents today for phlebotomy per MD orders. Phlebotomy procedure started at 1018 and ended at 1025. Approximately 500 mls removed. Patient observed for 30 minutes after procedure without any incident. Patient tolerated procedure well. IV needle removed intact.

## 2013-11-29 NOTE — Patient Instructions (Signed)

## 2013-11-29 NOTE — Progress Notes (Signed)
DIAGNOSES: 1. Secondary polycythemia. 2. Idiopathic pulmonary embolism/deep venous thrombosis of the right     leg.  CURRENT THERAPY: 1. Phlebotomy to maintain hematocrit below 45%. 2. Aspirin 162 mg p.o. daily.  INTERIM HISTORY:  Mr. Halt comes in for followup.  He is doing okay. He is oxygen dependent.  He has pretty bad lung disease.  He did see Dr. Karsten Ro of Urology.  He did have a cystoscopy.  A low- grade papillary carcinoma was noted.  This was removed.  Overall, Mr. Wadsworth has been doing pretty well.   He did see cardiology recently. Everything apparently has been doing okay. He does not need to see him back for another year.   He has had no problems with headache.  He has had no increasing shortness of breath.  He has had no fever, sweats, or chills.  He's had no issues with the flu or other respiratory infections. Brief phlebotomize him back when he was here in January. He has had no change in his medications.  PHYSICAL EXAMINATION:  General:  This is a well-developed, well- nourished white gentleman in no obvious distress.  Vital Signs: Temperature of 97., pulse 82., respiratory rate 20, blood pressure 125/67..  Weight is 209 pounds.  Head and Neck:  Normocephalic, atraumatic skull.  There are no ocular or oral lesions.  There are no palpable cervical or supraclavicular lymph nodes.  Lungs:  Clear bilaterally.  Cardiac:  Regular rate and rhythm with a normal S1, S2. There are no murmurs, rubs, or bruits.  Abdomen:  Soft.  He is some obese.  He has decent bowel sounds.  There is no palpable abdominal mass.  There is no palpable hepatosplenomegaly.  Extremities:  No clubbing, cyanosis, or edema.  There is some slight nonpitting edema of the right lower leg.  LABORATORY STUDIES:  White cell count is 8.2, hemoglobin 14.7, hematocrit 47.4, platelet count 2 or 3  IMPRESSION:  Mr. Hank is an 80 year old gentleman with history of pulmonary embolism and deep venous  thrombosis of the right leg.  He was treated with Coumadin.  I think, he was on Coumadin for 1 year.  Again, he is on aspirin.  I do not see any problems with respect to hypercoagulability.  We will go ahead and phlebotomize him.  Again, I want to try to get his hematocrit below 45%.  We will plan to get him back in another month or so, so we can continue to monitor his hemoglobin and hematocrit, and keep this below 45.

## 2013-12-17 ENCOUNTER — Other Ambulatory Visit: Payer: Self-pay | Admitting: Family Medicine

## 2013-12-17 ENCOUNTER — Other Ambulatory Visit: Payer: Self-pay | Admitting: Pulmonary Disease

## 2013-12-20 ENCOUNTER — Ambulatory Visit (INDEPENDENT_AMBULATORY_CARE_PROVIDER_SITE_OTHER): Payer: Medicare Other | Admitting: Family Medicine

## 2013-12-20 ENCOUNTER — Encounter: Payer: Self-pay | Admitting: Family Medicine

## 2013-12-20 VITALS — BP 142/78 | HR 83 | Temp 98.0°F | Ht 69.0 in | Wt 209.1 lb

## 2013-12-20 DIAGNOSIS — C801 Malignant (primary) neoplasm, unspecified: Secondary | ICD-10-CM

## 2013-12-20 DIAGNOSIS — N289 Disorder of kidney and ureter, unspecified: Secondary | ICD-10-CM

## 2013-12-20 DIAGNOSIS — C689 Malignant neoplasm of urinary organ, unspecified: Secondary | ICD-10-CM

## 2013-12-20 DIAGNOSIS — J449 Chronic obstructive pulmonary disease, unspecified: Secondary | ICD-10-CM

## 2013-12-20 DIAGNOSIS — E785 Hyperlipidemia, unspecified: Secondary | ICD-10-CM

## 2013-12-20 DIAGNOSIS — I10 Essential (primary) hypertension: Secondary | ICD-10-CM

## 2013-12-20 NOTE — Patient Instructions (Signed)
Aspercreme or Salon Pas cream to finger each night   DASH Diet The DASH diet stands for "Dietary Approaches to Stop Hypertension." It is a healthy eating plan that has been shown to reduce high blood pressure (hypertension) in as little as 14 days, while also possibly providing other significant health benefits. These other health benefits include reducing the risk of breast cancer after menopause and reducing the risk of type 2 diabetes, heart disease, colon cancer, and stroke. Health benefits also include weight loss and slowing kidney failure in patients with chronic kidney disease.  DIET GUIDELINES  Limit salt (sodium). Your diet should contain less than 1500 mg of sodium daily.  Limit refined or processed carbohydrates. Your diet should include mostly whole grains. Desserts and added sugars should be used sparingly.  Include small amounts of heart-healthy fats. These types of fats include nuts, oils, and tub margarine. Limit saturated and trans fats. These fats have been shown to be harmful in the body. CHOOSING FOODS  The following food groups are based on a 2000 calorie diet. See your Registered Dietitian for individual calorie needs. Grains and Grain Products (6 to 8 servings daily)  Eat More Often: Whole-wheat bread, brown rice, whole-grain or wheat pasta, quinoa, popcorn without added fat or salt (air popped).  Eat Less Often: White bread, white pasta, white rice, cornbread. Vegetables (4 to 5 servings daily)  Eat More Often: Fresh, frozen, and canned vegetables. Vegetables may be raw, steamed, roasted, or grilled with a minimal amount of fat.  Eat Less Often/Avoid: Creamed or fried vegetables. Vegetables in a cheese sauce. Fruit (4 to 5 servings daily)  Eat More Often: All fresh, canned (in natural juice), or frozen fruits. Dried fruits without added sugar. One hundred percent fruit juice ( cup [237 mL] daily).  Eat Less Often: Dried fruits with added sugar. Canned fruit in  light or heavy syrup. [employer], Fish, and Poultry (2 servings or less daily. One serving is 3 to 4 oz [85-114 g]).  Eat More Often: Ninety percent or leaner ground beef, tenderloin, sirloin. Round cuts of beef, chicken breast, [place] breast. All fish. Grill, bake, or broil your meat. Nothing should be fried.  Eat Less Often/Avoid: Fatty cuts of meat, [place], or chicken leg, thigh, or wing. Fried cuts of meat or fish. Dairy (2 to 3 servings)  Eat More Often: Low-fat or fat-free milk, low-fat plain or light yogurt, reduced-fat or part-skim cheese.  Eat Less Often/Avoid: Milk (whole, 2%).Whole milk yogurt. Full-fat cheeses. Nuts, Seeds, and Legumes (4 to 5 servings per week)  Eat More Often: All without added salt.  Eat Less Often/Avoid: Salted nuts and seeds, canned beans with added salt. Fats and Sweets (limited)  Eat More Often: Vegetable oils, tub margarines without trans fats, sugar-free gelatin. Mayonnaise and salad dressings.  Eat Less Often/Avoid: Coconut oils, palm oils, butter, stick margarine, cream, half and half, cookies, candy, pie. FOR MORE INFORMATION The Dash Diet Eating Plan: www.dashdiet.org Document Released: 09/02/2011 Document Revised: 12/06/2011 Document Reviewed: 09/02/2011 [facility] Patient Information 2014 [place], [place].

## 2013-12-20 NOTE — Progress Notes (Signed)
Pre visit review using our clinic review tool, if applicable. No additional management support is needed unless otherwise documented below in the visit note. 

## 2013-12-23 ENCOUNTER — Encounter: Payer: Self-pay | Admitting: Family Medicine

## 2013-12-23 DIAGNOSIS — N289 Disorder of kidney and ureter, unspecified: Secondary | ICD-10-CM

## 2013-12-23 HISTORY — DX: Disorder of kidney and ureter, unspecified: N28.9

## 2013-12-23 NOTE — Assessment & Plan Note (Signed)
Doing well most days, slightly increased dyspnea today

## 2013-12-23 NOTE — Assessment & Plan Note (Signed)
Following with Dr Loel Lofty

## 2013-12-23 NOTE — Assessment & Plan Note (Signed)
mild

## 2013-12-23 NOTE — Assessment & Plan Note (Signed)
Tolerating statin, encouraged heart healthy diet, avoid trans fats, minimize simple carbs and saturated fats. Increase exercise as tolerated 

## 2013-12-23 NOTE — Assessment & Plan Note (Signed)
Well controlled, no changes to meds. Encouraged heart healthy diet such as the DASH diet and exercise as tolerated.  °

## 2013-12-23 NOTE — Progress Notes (Signed)
Patient ID: Tracy [name], male   DOB: 1925/11/18, 80 y.o.   MRN: [id number] RENOLD KOZAR [id number] 1926-06-20 12/23/2013      Progress Note-Follow Up  Subjective  Chief Complaint  Chief Complaint  Patient presents with  . Follow-up    2 month    HPI  Patient is a 80 year old male in today for routine medical care. Is generally feels well. Continues to struggle with fatigue and malaise but reports most days his breathing is good. Today he feels somewhat more short-winded than usual he denies any fevers or other signs of acute illness. Denies CP/palp/HA/congestion/fevers/GI or GU c/o. Taking meds as prescribed  Past Medical History  Diagnosis Date  . Chronic airway obstruction, not elsewhere classified   . Unspecified essential hypertension   . Cerebrovascular disease, unspecified   . Other and unspecified hyperlipidemia   . Diaphragmatic hernia without mention of obstruction or gangrene   . Diverticulosis of colon (without mention of hemorrhage)   . Benign neoplasm of colon   . Overweight   . On home oxygen therapy   . H/O tobacco use, presenting hazards to health 01/25/2013  . Edema 04/18/2013  . OSA on CPAP   . Peripheral vascular disease     hx of DVT stopped Coumadin 06/05/2013   . Cancer     kidney cancer   . Arthritis   . Polycythemia   . DVT (deep venous thrombosis)     2014   . Pulmonary embolism   . 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 Renal insufficiency 12/23/2013    Past Surgical History  Procedure Laterality Date  . Hernia repair    . Nephroureterectomy    . Blasdder tumor removed     . Transurethral resection of bladder tumor N/A 06/25/2013    Procedure: TRANSURETHRAL RESECTION OF BLADDER TUMOR (TURBT);  Surgeon: Claybon Jabs, MD;  Location: [facility];  Service: Urology;  Laterality: N/A;  . Hydrocele excision Right 06/25/2013    Procedure: HYDROCELECTOMY ADULT;  Surgeon: Claybon Jabs, MD;  Location: [facility];  Service: Urology;  Laterality: Right;     Family History  Problem Relation Age of Onset  . Cancer Mother     colon with mets  . COPD Father   . Hypertension Father   . Arthritis Sister   . Other Son     h/o cystitis    History   Social History  . Marital Status: Married    Spouse Name: Zelphia Cairo    Number of Children: 3  . Years of Education: N/A   Occupational History  .     Social History Main Topics  . Smoking status: Former Smoker -- 1.50 packs/day    Types: Cigarettes    Quit date: 09/27/1978  . Smokeless tobacco: Never Used  . Alcohol Use: No  . Drug Use: No  . Sexual Activity: No   Other Topics Concern  . Not on file   Social History Narrative  . No narrative on file    Current Outpatient Prescriptions on File Prior to Visit  Medication Sig Dispense Refill  . acetaminophen (TYLENOL ARTHRITIS PAIN) 650 MG CR tablet Take 650 mg by mouth every morning.       Marland Kitchen ADVAIR DISKUS 250-50 MCG/DOSE AEPB IHALE ONE PUFF TWICE DAILY  60 each  3  . albuterol (PROVENTIL, VENTOLIN) (5 MG/ML) 0.5% NEBU Take by nebulization as needed.      Marland Kitchen aspirin 81 MG tablet Take 81 mg by  mouth 2 (two) times daily.       . furosemide (LASIX) 40 MG tablet TAKE ONE TABLET DAILY IN THE MORNING ANDTAKE ONE HALF TABLET BY MOUTHDAILY IN THE EVENING  45 tablet  3  . guaiFENesin (MUCINEX) 600 MG 12 hr tablet Take 1,200 mg by mouth 2 (two) times daily.       Marland Kitchen losartan (COZAAR) 25 MG tablet 1/2 tab po qd      . Omega-3 Fatty Acids (FISH OIL) 1200 MG CAPS Take 1,200 mg by mouth every morning.      . psyllium (METAMUCIL) 58.6 % packet Take 1 packet by mouth daily.      . simvastatin (ZOCOR) 40 MG tablet TAKE ONE (1) TABLET EACH DAY  30 tablet  3  . SPIRIVA HANDIHALER 18 MCG inhalation capsule USE 1 CAPSULE WITH HANDIHALER DEVICE ANDINHALE ONCE DAILY  30 capsule  2   No current facility-administered medications on file prior to visit.    Allergies  Allergen Reactions  . Sulfamethoxazole Other (See Comments)    REACTION: "washes"  him out per pt    Review of Systems  Review of Systems  Constitutional: Positive for malaise/fatigue. Negative for fever.  HENT: Positive for congestion.   Eyes: Negative for discharge.  Respiratory: Positive for shortness of breath.   Cardiovascular: Negative for chest pain, palpitations and leg swelling.  Gastrointestinal: Negative for nausea, abdominal pain and diarrhea.  Genitourinary: Negative for dysuria.  Musculoskeletal: Negative for falls.  Skin: Negative for rash.  Neurological: Negative for loss of consciousness and headaches.  Endo/Heme/Allergies: Negative for polydipsia.  Psychiatric/Behavioral: Negative for depression and suicidal ideas. The patient is not nervous/anxious and does not have insomnia.     Objective  BP 142/78  Pulse 83  Temp(Src) 98 F (36.7 C) (Oral)  Ht 5\' 9"  (1.753 m)  Wt 209 lb 1.3 oz (94.838 kg)  BMI 30.86 kg/m2  SpO2 89%  Physical Exam  Physical Exam  Constitutional: He is oriented to person, place, and time and well-developed, well-nourished, and in no distress. No distress.  HENT:  Head: Normocephalic and atraumatic.  Eyes: Conjunctivae are normal.  Neck: Neck supple. No thyromegaly present.  Cardiovascular: Normal rate, regular rhythm and normal heart sounds.   No murmur heard. Pulmonary/Chest: Effort normal and breath sounds normal. No respiratory distress.  O2 via [place]  Abdominal: He exhibits no distension and no mass. There is no tenderness.  Musculoskeletal: He exhibits no edema.  Neurological: He is alert and oriented to person, place, and time.  Skin: Skin is warm.  Psychiatric: Memory, affect and judgment normal.    Lab Results  Component Value Date   TSH 3.238 10/09/2013   Lab Results  Component Value Date   WBC 8.2 11/29/2013   HGB 14.7 11/29/2013   HCT 47.4 11/29/2013   MCV 91 11/29/2013   PLT 203 11/29/2013   Lab Results  Component Value Date   CREATININE 1.46* 10/09/2013   BUN 24* 10/09/2013   NA 141 10/09/2013   K  4.1 10/09/2013   CL 103 10/09/2013   CO2 32 10/09/2013   Lab Results  Component Value Date   ALT 18 10/09/2013   AST 22 10/09/2013   ALKPHOS 54 10/09/2013   BILITOT 0.4 10/09/2013   Lab Results  Component Value Date   CHOL 136 10/09/2013   Lab Results  Component Value Date   HDL 42 10/09/2013   Lab Results  Component Value Date   LDLCALC 63 10/09/2013  Lab Results  Component Value Date   TRIG 156* 10/09/2013   Lab Results  Component Value Date   CHOLHDL 3.2 10/09/2013     Assessment & Plan  HYPERTENSION, BORDERLINE Well controlled, no changes to meds. Encouraged heart healthy diet such as the DASH diet and exercise as tolerated.   HYPERLIPIDEMIA Tolerating statin, encouraged heart healthy diet, avoid trans fats, minimize simple carbs and saturated fats. Increase exercise as tolerated  Urothelial carcinoma Following with Dr Loel Lofty  COPD Doing well most days, slightly increased dyspnea today  Renal insufficiency mild

## 2014-01-08 ENCOUNTER — Ambulatory Visit: Payer: Medicare Other

## 2014-01-08 ENCOUNTER — Other Ambulatory Visit (HOSPITAL_BASED_OUTPATIENT_CLINIC_OR_DEPARTMENT_OTHER): Payer: Medicare Other | Admitting: Lab

## 2014-01-08 ENCOUNTER — Ambulatory Visit (HOSPITAL_BASED_OUTPATIENT_CLINIC_OR_DEPARTMENT_OTHER): Payer: Medicare Other | Admitting: Hematology & Oncology

## 2014-01-08 ENCOUNTER — Encounter: Payer: Self-pay | Admitting: Hematology & Oncology

## 2014-01-08 VITALS — BP 139/73 | HR 71 | Temp 98.2°F | Resp 22 | Ht 67.0 in | Wt 208.0 lb

## 2014-01-08 DIAGNOSIS — J449 Chronic obstructive pulmonary disease, unspecified: Secondary | ICD-10-CM

## 2014-01-08 DIAGNOSIS — I82409 Acute embolism and thrombosis of unspecified deep veins of unspecified lower extremity: Secondary | ICD-10-CM

## 2014-01-08 DIAGNOSIS — D751 Secondary polycythemia: Secondary | ICD-10-CM

## 2014-01-08 DIAGNOSIS — G4733 Obstructive sleep apnea (adult) (pediatric): Secondary | ICD-10-CM

## 2014-01-08 LAB — CBC WITH DIFFERENTIAL (CANCER CENTER ONLY)
BASO#: 0 10*3/uL (ref 0.0–0.2)
BASO%: 0.3 % (ref 0.0–2.0)
EOS%: 4.4 % (ref 0.0–7.0)
Eosinophils Absolute: 0.4 10*3/uL (ref 0.0–0.5)
HEMATOCRIT: 43.5 % (ref 38.7–49.9)
HEMOGLOBIN: 13.8 g/dL (ref 13.0–17.1)
LYMPH#: 1.2 10*3/uL (ref 0.9–3.3)
LYMPH%: 13.8 % — AB (ref 14.0–48.0)
MCH: 28.7 pg (ref 28.0–33.4)
MCHC: 31.7 g/dL — ABNORMAL LOW (ref 32.0–35.9)
MCV: 90 fL (ref 82–98)
MONO#: 1.2 10*3/uL — ABNORMAL HIGH (ref 0.1–0.9)
MONO%: 13.5 % — ABNORMAL HIGH (ref 0.0–13.0)
NEUT#: 6 10*3/uL (ref 1.5–6.5)
NEUT%: 68 % (ref 40.0–80.0)
Platelets: 231 10*3/uL (ref 145–400)
RBC: 4.81 10*6/uL (ref 4.20–5.70)
RDW: 15.1 % (ref 11.1–15.7)
WBC: 8.8 10*3/uL (ref 4.0–10.0)

## 2014-01-08 NOTE — Progress Notes (Signed)
Hematology and Oncology Follow Up Visit  Tracy [name] [id number] May 27, 1926 80 y.o. 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  [name] is back for followup. He is doing fairly well. Last him back in January. Since then, he's had no problems. He still wearing oxygen.  We did phlebotomize him back we saw him in January.  He's had no increased shortness of breath. He does wear the oxygen all the time. Breath he's had no headache. He's had no nausea vomiting. There's been no chest wall pain. He's had decreased his leg swelling. Is not taking as much Lasix.  Medications: Current outpatient prescriptions:acetaminophen (TYLENOL ARTHRITIS PAIN) 650 MG CR tablet, Take 650 mg by mouth every morning. , Disp: , Rfl: ;  ADVAIR DISKUS 250-50 MCG/DOSE AEPB, IHALE ONE PUFF TWICE DAILY, Disp: 60 each, Rfl: 3;  albuterol (PROVENTIL, VENTOLIN) (5 MG/ML) 0.5% NEBU, Take by nebulization as needed., Disp: , Rfl: ;  aspirin 81 MG tablet, Take 81 mg by mouth 2 (two) times daily. , Disp: , Rfl:  furosemide (LASIX) 40 MG tablet, TAKE ONE TABLET DAILY IN THE MORNING ANDTAKE ONE HALF TABLET BY MOUTHDAILY IN THE EVENING, Disp: 45 tablet, Rfl: 3;  guaiFENesin (MUCINEX) 600 MG 12 hr tablet, Take 1,200 mg by mouth 2 (two) times daily. , Disp: , Rfl: ;  losartan (COZAAR) 25 MG tablet, 1/2 tab po qd, Disp: , Rfl: ;  Omega-3 Fatty Acids (FISH OIL) 1200 MG CAPS, Take 1,200 mg by mouth every morning., Disp: , Rfl:  psyllium (METAMUCIL) 58.6 % packet, Take 1 packet by mouth daily., Disp: , Rfl: ;  simvastatin (ZOCOR) 40 MG tablet, TAKE ONE (1) TABLET EACH DAY, Disp: 30 tablet, Rfl: 3;  SPIRIVA HANDIHALER 18 MCG inhalation capsule, USE 1 CAPSULE WITH HANDIHALER DEVICE ANDINHALE ONCE DAILY, Disp: 30  capsule, Rfl: 2  Allergies:  Allergies  Allergen Reactions  . Sulfamethoxazole Other (See Comments)    REACTION: "washes" him out per pt    Past Medical History, Surgical history, Social history, and Family History were reviewed and updated.  Review of Systems: As above  Physical Exam:  height is 5\' 7"  (1.702 m) and weight is 208 lb (94.348 kg). His oral temperature is 98.2 F (36.8 C). His blood pressure is 139/73 and his pulse is 71. His respiration is 22 and oxygen saturation is 95%.   Somewhat elderly white gentleman. He is slightly cushingoid in appearance. His lungs are with decreased breath sounds throughout all lung fields. Head and neck exam shows no oral lesions. He has no scleritis. Cardiac exam regular rate and rhythm with a 1/6 systolic ejection murmur. Abdomen is soft. He is mildly obese. He has good bowel sounds. There is no palpable hepato- pedal megaly. Back exam no tenderness over the spine ribs or hips. Extremities shows no clubbing cyanosis or edema. He may have some slight trace edema. Skin exam shows some scattered ecchymoses. Neurological exam no focal neurological deficits.  Lab Results  Component Value Date   WBC 8.8 01/08/2014   HGB 13.8 01/08/2014   HCT 43.5 01/08/2014   MCV 90 01/08/2014   PLT 231 01/08/2014     Chemistry      Component Value Date/Time   NA 141 10/09/2013 0806  K 4.1 10/09/2013 0806   CL 103 10/09/2013 0806   CO2 32 10/09/2013 0806   BUN 24* 10/09/2013 0806   CREATININE 1.46* 10/09/2013 0806   CREATININE 1.37* 06/19/2013 1020      Component Value Date/Time   CALCIUM 8.8 10/09/2013 0806   ALKPHOS 54 10/09/2013 0806   AST 22 10/09/2013 0806   ALT 18 10/09/2013 0806   BILITOT 0.4 10/09/2013 0806         Impression and Plan: Tracy [name] is a 37-year-old gentleman with secondary polycythemia. We undoubtedly make him iron deficient. He does not need to be phlebotomized right now.  I think we'll probably wait 4 months now. We may need to get  him phlebotomized we see him back. Overall, he has been doing good.   Volanda Napoleon, MD 4/14/201510:36 AM

## 2014-01-09 ENCOUNTER — Other Ambulatory Visit: Payer: Self-pay | Admitting: Family Medicine

## 2014-02-07 ENCOUNTER — Ambulatory Visit (INDEPENDENT_AMBULATORY_CARE_PROVIDER_SITE_OTHER): Payer: Medicare Other | Admitting: Pulmonary Disease

## 2014-02-07 ENCOUNTER — Telehealth: Payer: Self-pay | Admitting: Pulmonary Disease

## 2014-02-07 ENCOUNTER — Encounter: Payer: Self-pay | Admitting: Pulmonary Disease

## 2014-02-07 VITALS — BP 132/82 | HR 102 | Temp 98.0°F | Ht 70.0 in | Wt 208.1 lb

## 2014-02-07 DIAGNOSIS — J9601 Acute respiratory failure with hypoxia: Secondary | ICD-10-CM

## 2014-02-07 DIAGNOSIS — J9600 Acute respiratory failure, unspecified whether with hypoxia or hypercapnia: Secondary | ICD-10-CM

## 2014-02-07 DIAGNOSIS — J449 Chronic obstructive pulmonary disease, unspecified: Secondary | ICD-10-CM

## 2014-02-07 MED ORDER — PREDNISONE 10 MG PO TABS: ORAL_TABLET | ORAL | Status: DC

## 2014-02-07 MED ORDER — ALBUTEROL (5 MG/ML) CONTINUOUS INHALATION SOLN: 5.0000 mg/h | INHALATION_SOLUTION | RESPIRATORY_TRACT | Status: DC | PRN

## 2014-02-07 NOTE — Assessment & Plan Note (Signed)
Add evening dose of lasix x 3 days, then 1/2 tab until seen again Call if worse Offered hospital admission but he refused He has living will, desires short term intubation if reversible issues Unclear cause of worsening hypoxia - /seasonal allergies vs cor pulmonale, doubt PE

## 2014-02-07 NOTE — Patient Instructions (Signed)
Prednisone 10 mg -Take 4 tabs  daily with food x 4 days, then 3 tabs daily x 4 days, then 2 tabs daily x 4 days, then 1 tab daily x4 days then stop. #40 Add evening dose of lasix x 3 days, then 1/2 tab until seen again Call if worse Refill albuterol nebs

## 2014-02-07 NOTE — Assessment & Plan Note (Signed)
Prednisone 10 mg -Take 4 tabs  daily with food x 4 days, then 3 tabs daily x 4 days, then 2 tabs daily x 4 days, then 1 tab daily x4 days then stop. #40 Refill albuterol nebs

## 2014-02-07 NOTE — Progress Notes (Signed)
   Subjective:    Patient ID: Tracy [name], male    DOB: April 18, 1926, 80 y.o.   MRN: [id number]  HPI  PCP- [facility]   80 y/o WM for FU of OSA; COPD, severe hypoxia & new pulmonary nodule noted dec'13  H/o sub segmental PE/ RLE DVT in dec 13   Data/ Events :  OBSTRUCTIVE SLEEP APNEA - ~ sleep study 3/06 w/ RDI 26 w/ desat to 76%...  COPD - on home O2 .  ~ PFT 11/04 showed FVC=4.51 (103%), FEV1=1.93 (67%) and DLCO~50%...  ~ CT Chest 9/05 & CT abd 1/12 showed marked emphysema w/ blebs, & thor spondylosis...  ~ Office spirometry 11/05 showed FEV1=1.57 (48%)...  ~ PFT 4/10 showed FVC= 3.00 (77%), FEV1= 1.37 (45%)  ~02/21/13 Echo with Bubble study >> mild LVH, EF 60 to 65%, mild AS, PAS 51 mmHg, no PFO  ~Oximizer added 02/23/13 for desats >5 /rest , 8l/m act/qhs  Hospitalised 09/26/12 for worsening hypoxia  Ct angio showed Subsegmental acute pulmonary thromboembolism in the right lower lobe, 7 mm spiculated lung lesion in the right middle lobe & bullous emphysema  DUplex rt popliteal DVt noted -Treated with coumadin x12 mnths  CT chest 02/2013 shows decrease in size of nodule from 78mm to 97mm  PFTs -12/13- fev1 52% (1.30 ) -unchanged from last  DLCO 29%  ONO on cpap + 3L O2 showed drop in satn x 2h  Readmitted 01/2013 for cor pulmonale -improved with diuresis    08/21/2013 Acute OV -mild hemoptysis-resolved spontaneously    Sep 2014 -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 Karsten Ro.  Continues to follow with Dr. Marin Olp for polycythemia with phlebotomy.    02/07/2014  Chief Complaint  Patient presents with  . Follow-up    Breathing is not good, prod cough-light yellow phlem, lots of wheezing. Uses 5-6 liters O2 w/ rest and 10 liters w/ exertion. Pt entered exam room 78% on 10 liters O2. After resting recovered to 95% 10 Liters.    Requiring more oxygen Occ cough -no wheeze or pedal edema Uses 6L at rest & 8-10L on walking Has cut out evening dose of  lasix Uses nebs twice/wk, compliant with other meds Does not like oximiser much since heavy  09/2013 cxr no changes,scarring RLL Review of Systems neg for any significant sore throat, dysphagia, itching, sneezing, nasal congestion or excess/ purulent secretions, fever, chills, sweats, unintended wt loss, pleuritic or exertional cp, hempoptysis, orthopnea pnd or change in chronic leg swelling. Also denies presyncope, palpitations, heartburn, abdominal pain, nausea, vomiting, diarrhea or change in bowel or urinary habits, dysuria,hematuria, rash, arthralgias, visual complaints, headache, numbness weakness or ataxia.     Objective:   Physical Exam  Gen. Pleasant, well-nourished, in no distress ENT - no lesions, no post nasal drip Neck: No JVD, no thyromegaly, no carotid bruits Lungs: no use of accessory muscles, no dullness to percussion, clear without rales or rhonchi  Cardiovascular: Rhythm regular, heart sounds  normal, no murmurs or gallops, no peripheral edema Musculoskeletal: No deformities, no cyanosis or clubbing        Assessment & Plan:

## 2014-02-07 NOTE — Telephone Encounter (Signed)
Called and spoke to pt. Pt was seen 02/07/14 at 1:30 by RA and albuterol neb rx was refilled. Pt states pharm is requesting PA for albuterol neb solution 5mg /ml, 0.5% solution. This is not the beginning of tx for albuterol neb, pt paid out of pocket when filling this medication last time. Pt now requesting for a PA for the albuterol. Called [facility] to inquire about covered alternative medications and there aren't any. [facility] will fax PA forms to triage fax. Will await fax.

## 2014-02-08 NOTE — Telephone Encounter (Signed)
lmtcb x1 for pt. 

## 2014-02-08 NOTE — Telephone Encounter (Signed)
Called [facility]. Spoke with Kylie. Pt has both part B and part D. She is going to work on this to see why it is requiring PA. Gave her pt DX code and when neb machine/meds started. She will call me back with an update. Pt has not picked this up yet/paid out of pocket. Will wait her call back  Called pt to make aware we are working on this

## 2014-02-08 NOTE — Telephone Encounter (Signed)
[facility] called back and the neb meds went thru with medicare and they will deliver today

## 2014-02-11 ENCOUNTER — Other Ambulatory Visit: Payer: Self-pay | Admitting: Family Medicine

## 2014-02-11 MED ORDER — ALBUTEROL SULFATE (2.5 MG/3ML) 0.083% IN NEBU: 2.5000 mg | INHALATION_SOLUTION | Freq: Four times a day (QID) | RESPIRATORY_TRACT | Status: DC | PRN

## 2014-02-11 NOTE — Telephone Encounter (Addendum)
Pt states that medications have been delivered.  Pt states that neb meds instructions states that each vial should be mixed with a certain amt of sterile saline solution.  Pt states that he has never had to do this with his nebulizer meds and the dose of .5% just looks "wrong"  Spoke with Carla-pharmacist at [facility] -- Pt has never been rxd the Albuterol 0.5% in the past. Angela Nevin states that all historical meds on file state Albuterol 0.083% neb   Dr Elsworth Soho, please advise if okay to change pt back to his original Albuterol 0.083% neb (message closed in error, please route back to triage when you respond, thanks)

## 2014-02-11 NOTE — Telephone Encounter (Signed)
Please change to 0.083%

## 2014-02-11 NOTE — Addendum Note (Signed)
Addended by: Inge Rise on: 02/11/2014 04:35 PM   Modules accepted: Orders, Medications

## 2014-02-11 NOTE — Telephone Encounter (Signed)
Called spoke with Tracy [name]. Aware we are changing RX. She reports pt can take the old albuterol 0.05 neb solution to the "clinic" and they will pick this up and send the right dosage to him. I called spoke with pt. Made aware of recs by carla. He will do so. Nothing further needed

## 2014-02-21 ENCOUNTER — Ambulatory Visit (INDEPENDENT_AMBULATORY_CARE_PROVIDER_SITE_OTHER)
Admission: RE | Admit: 2014-02-21 | Discharge: 2014-02-21 | Disposition: A | Discharge status: Home / Self Care | Payer: Medicare Other | Source: Ambulatory Visit | Attending: Adult Health | Admitting: Adult Health

## 2014-02-21 ENCOUNTER — Ambulatory Visit: Payer: Medicare Other | Admitting: Pulmonary Disease

## 2014-02-21 ENCOUNTER — Ambulatory Visit (INDEPENDENT_AMBULATORY_CARE_PROVIDER_SITE_OTHER): Payer: Medicare Other | Admitting: Adult Health

## 2014-02-21 ENCOUNTER — Encounter: Payer: Self-pay | Admitting: Adult Health

## 2014-02-21 VITALS — HR 89 | Temp 98.1°F | Ht 70.0 in | Wt 206.4 lb

## 2014-02-21 DIAGNOSIS — J449 Chronic obstructive pulmonary disease, unspecified: Secondary | ICD-10-CM

## 2014-02-21 DIAGNOSIS — J4489 Other specified chronic obstructive pulmonary disease: Secondary | ICD-10-CM

## 2014-02-21 MED ORDER — AMOXICILLIN-POT CLAVULANATE 875-125 MG PO TABS: 1.0000 | ORAL_TABLET | Freq: Two times a day (BID) | ORAL | Status: AC

## 2014-02-21 NOTE — Patient Instructions (Addendum)
Augmentin 875mg  Twice daily  For  7 days  Mucinex DM Twice daily  As needed  Cough/congestion  Finish Prednisone taper over next week.  Low salt diet  Continue on Lasix 40mg  daily in am and 1/2 in evening .  follow up Dr. Elsworth Soho  In 4-6 weeks and As needed  [facility] )  Please contact office for sooner follow up if symptoms do not improve or worsen or seek emergency care

## 2014-02-21 NOTE — Addendum Note (Signed)
Addended by: Parke Poisson E on: 02/21/2014 09:59 AM   Modules accepted: Orders

## 2014-02-21 NOTE — Progress Notes (Signed)
   Subjective:    Patient ID: DNAIEL VOLLER, male    DOB: 04/02/1926, 80 y.o.   MRN: [id number]  HPI   PCP- [facility]   80 y/o WM for FU of OSA; COPD, severe hypoxia & new pulmonary nodule noted dec'13  H/o sub segmental PE/ RLE DVT in dec 13   Data/ Events :  OBSTRUCTIVE SLEEP APNEA - ~ sleep study 3/06 w/ RDI 26 w/ desat to 76%...  COPD - on home O2 .  ~ PFT 11/04 showed FVC=4.51 (103%), FEV1=1.93 (67%) and DLCO~50%...  ~ CT Chest 9/05 & CT abd 1/12 showed marked emphysema w/ blebs, & thor spondylosis...  ~ Office spirometry 11/05 showed FEV1=1.57 (48%)...  ~ PFT 4/10 showed FVC= 3.00 (77%), FEV1= 1.37 (45%)  ~02/21/13 Echo with Bubble study >> mild LVH, EF 60 to 65%, mild AS, PAS 51 mmHg, no PFO  ~Oximizer added 02/23/13 for desats >5 /rest , 8l/m act/qhs  Hospitalised 09/26/12 for worsening hypoxia  Ct angio showed Subsegmental acute pulmonary thromboembolism in the right lower lobe, 7 mm spiculated lung lesion in the right middle lobe & bullous emphysema  DUplex rt popliteal DVt noted -Treated with coumadin x12 mnths  CT chest 02/2013 shows decrease in size of nodule from 60mm to 58mm  PFTs -12/13- fev1 52% (1.30 ) -unchanged from last  DLCO 29%  ONO on cpap + 3L O2 showed drop in satn x 2h  Readmitted 01/2013 for cor pulmonale -improved with diuresis    08/21/2013 Acute OV -mild hemoptysis-resolved spontaneously    Sep 2014 -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 Karsten Ro.  Continues to follow with Dr. Marin Olp for polycythemia with phlebotomy.    02/11/14 AcuteO OV  Requiring more oxygen Occ cough -no wheeze or pedal edema Uses 6L at rest & 8-10L on walking Has cut out evening dose of lasix Uses nebs twice/wk, compliant with other meds Does not like oximiser much since heavy  09/2013 cxr no changes,scarring RLL >>pred taper   02/21/2014 Follow up  Pt returns for follow up for COPD flare.  Seen 2 weeks ago given pred taper . Does feel some  better but  Still coughing with  white-yellow mucus, occ chest ightness.  still taking prednisone, has few days left.  Was restarted on afternoon lasix last ov , says wt is down and ankles not as swollen. Now on lasix 40mg  in am and 20mg  in pm  No orthopnea or chest pain.  No fever, hemoptysis or n/v/d Good appetite.  O2 demands about the same. 5-6 L at rest and 8-10 walking.    Review of Systems  neg for any significant sore throat, dysphagia, itching, sneezing,   fever, chills, sweats, unintended wt loss, pleuritic or exertional cp, hempoptysis, orthopnea pnd or change in chronic leg swelling. Also denies presyncope, palpitations, heartburn, abdominal pain, nausea, vomiting, diarrhea or change in bowel or urinary habits, dysuria,hematuria, rash, arthralgias, visual complaints, headache, numbness weakness or ataxia.     Objective:   Physical Exam   Gen. Pleasant, well-nourished, in no distress, elderly  ENT - no lesions, no post nasal drip Neck: No JVD, no thyromegaly, no carotid bruits Lungs: no use of accessory muscles, no dullness to percussion, clear without rales or rhonchi , diminshed BS in bases , no wheezing  Cardiovascular: Rhythm regular, heart sounds  normal, no murmurs or gallops, tr peripheral edema Musculoskeletal: No deformities, no cyanosis or clubbing        Assessment & Plan:

## 2014-02-21 NOTE — Assessment & Plan Note (Signed)
Slow to resolve flare  Check cxr   Plan  Augmentin 875mg  Twice daily  For  7 days  Mucinex DM Twice daily  As needed  Cough/congestion  Finish Prednisone taper over next week.  Low salt diet  Continue on Lasix 40mg  daily in am and 1/2 in evening .  follow up Dr. Elsworth Soho  In 4-6 weeks and As needed  [facility] )  Please contact office for sooner follow up if symptoms do not improve or worsen or seek emergency care

## 2014-02-22 NOTE — Progress Notes (Signed)
Quick Note:  Called spoke with patient, advised of cxr results / recs as stated by TP. Pt verbalized his understanding and denied any questions. ______ 

## 2014-02-25 NOTE — Progress Notes (Signed)
Reviewed & agree with plan  

## 2014-02-26 ENCOUNTER — Encounter (HOSPITAL_COMMUNITY): Payer: Self-pay | Admitting: Emergency Medicine

## 2014-02-26 ENCOUNTER — Inpatient Hospital Stay (HOSPITAL_COMMUNITY)
Admission: EM | Admit: 2014-02-26 | Discharge: 2014-03-02 | DRG: 193 | Disposition: A | Discharge status: Nursing Home (Includes ICF) | Payer: Medicare Other | Attending: Internal Medicine | Admitting: Internal Medicine

## 2014-02-26 ENCOUNTER — Emergency Department (HOSPITAL_COMMUNITY): Payer: Medicare Other

## 2014-02-26 DIAGNOSIS — E663 Overweight: Secondary | ICD-10-CM

## 2014-02-26 DIAGNOSIS — Z87891 Personal history of nicotine dependence: Secondary | ICD-10-CM

## 2014-02-26 DIAGNOSIS — I2609 Other pulmonary embolism with acute cor pulmonale: Secondary | ICD-10-CM | POA: Diagnosis present

## 2014-02-26 DIAGNOSIS — I471 Supraventricular tachycardia: Secondary | ICD-10-CM | POA: Diagnosis present

## 2014-02-26 DIAGNOSIS — R233 Spontaneous ecchymoses: Secondary | ICD-10-CM | POA: Diagnosis present

## 2014-02-26 DIAGNOSIS — I359 Nonrheumatic aortic valve disorder, unspecified: Secondary | ICD-10-CM

## 2014-02-26 DIAGNOSIS — G4733 Obstructive sleep apnea (adult) (pediatric): Secondary | ICD-10-CM | POA: Diagnosis present

## 2014-02-26 DIAGNOSIS — N289 Disorder of kidney and ureter, unspecified: Secondary | ICD-10-CM

## 2014-02-26 DIAGNOSIS — M79604 Pain in right leg: Secondary | ICD-10-CM

## 2014-02-26 DIAGNOSIS — I4719 Other supraventricular tachycardia: Secondary | ICD-10-CM | POA: Diagnosis present

## 2014-02-26 DIAGNOSIS — I2699 Other pulmonary embolism without acute cor pulmonale: Secondary | ICD-10-CM | POA: Diagnosis present

## 2014-02-26 DIAGNOSIS — J441 Chronic obstructive pulmonary disease with (acute) exacerbation: Secondary | ICD-10-CM | POA: Diagnosis present

## 2014-02-26 DIAGNOSIS — M1990 Unspecified osteoarthritis, unspecified site: Secondary | ICD-10-CM

## 2014-02-26 DIAGNOSIS — R531 Weakness: Secondary | ICD-10-CM

## 2014-02-26 DIAGNOSIS — J9620 Acute and chronic respiratory failure, unspecified whether with hypoxia or hypercapnia: Secondary | ICD-10-CM | POA: Diagnosis present

## 2014-02-26 DIAGNOSIS — D126 Benign neoplasm of colon, unspecified: Secondary | ICD-10-CM

## 2014-02-26 DIAGNOSIS — E876 Hypokalemia: Secondary | ICD-10-CM | POA: Diagnosis present

## 2014-02-26 DIAGNOSIS — Z79899 Other long term (current) drug therapy: Secondary | ICD-10-CM

## 2014-02-26 DIAGNOSIS — N183 Chronic kidney disease, stage 3 unspecified: Secondary | ICD-10-CM | POA: Diagnosis present

## 2014-02-26 DIAGNOSIS — I679 Cerebrovascular disease, unspecified: Secondary | ICD-10-CM

## 2014-02-26 DIAGNOSIS — I498 Other specified cardiac arrhythmias: Secondary | ICD-10-CM | POA: Diagnosis present

## 2014-02-26 DIAGNOSIS — Z86718 Personal history of other venous thrombosis and embolism: Secondary | ICD-10-CM

## 2014-02-26 DIAGNOSIS — R609 Edema, unspecified: Secondary | ICD-10-CM

## 2014-02-26 DIAGNOSIS — Z8673 Personal history of transient ischemic attack (TIA), and cerebral infarction without residual deficits: Secondary | ICD-10-CM

## 2014-02-26 DIAGNOSIS — R0600 Dyspnea, unspecified: Secondary | ICD-10-CM | POA: Diagnosis present

## 2014-02-26 DIAGNOSIS — R6883 Chills (without fever): Secondary | ICD-10-CM

## 2014-02-26 DIAGNOSIS — I2789 Other specified pulmonary heart diseases: Secondary | ICD-10-CM | POA: Diagnosis present

## 2014-02-26 DIAGNOSIS — K5730 Diverticulosis of large intestine without perforation or abscess without bleeding: Secondary | ICD-10-CM

## 2014-02-26 DIAGNOSIS — J189 Pneumonia, unspecified organism: Principal | ICD-10-CM | POA: Diagnosis present

## 2014-02-26 DIAGNOSIS — C689 Malignant neoplasm of urinary organ, unspecified: Secondary | ICD-10-CM

## 2014-02-26 DIAGNOSIS — I129 Hypertensive chronic kidney disease with stage 1 through stage 4 chronic kidney disease, or unspecified chronic kidney disease: Secondary | ICD-10-CM | POA: Diagnosis present

## 2014-02-26 DIAGNOSIS — Z85528 Personal history of other malignant neoplasm of kidney: Secondary | ICD-10-CM

## 2014-02-26 DIAGNOSIS — R509 Fever, unspecified: Secondary | ICD-10-CM

## 2014-02-26 DIAGNOSIS — C679 Malignant neoplasm of bladder, unspecified: Secondary | ICD-10-CM

## 2014-02-26 DIAGNOSIS — R0602 Shortness of breath: Secondary | ICD-10-CM

## 2014-02-26 DIAGNOSIS — Z7982 Long term (current) use of aspirin: Secondary | ICD-10-CM

## 2014-02-26 DIAGNOSIS — K449 Diaphragmatic hernia without obstruction or gangrene: Secondary | ICD-10-CM

## 2014-02-26 DIAGNOSIS — E785 Hyperlipidemia, unspecified: Secondary | ICD-10-CM | POA: Diagnosis present

## 2014-02-26 DIAGNOSIS — Z9981 Dependence on supplemental oxygen: Secondary | ICD-10-CM

## 2014-02-26 DIAGNOSIS — I10 Essential (primary) hypertension: Secondary | ICD-10-CM | POA: Diagnosis present

## 2014-02-26 DIAGNOSIS — Z0000 Encounter for general adult medical examination without abnormal findings: Secondary | ICD-10-CM

## 2014-02-26 DIAGNOSIS — J4489 Other specified chronic obstructive pulmonary disease: Secondary | ICD-10-CM | POA: Diagnosis present

## 2014-02-26 DIAGNOSIS — R911 Solitary pulmonary nodule: Secondary | ICD-10-CM

## 2014-02-26 DIAGNOSIS — Z6829 Body mass index (BMI) 29.0-29.9, adult: Secondary | ICD-10-CM

## 2014-02-26 DIAGNOSIS — J9601 Acute respiratory failure with hypoxia: Secondary | ICD-10-CM

## 2014-02-26 DIAGNOSIS — R0609 Other forms of dyspnea: Secondary | ICD-10-CM | POA: Diagnosis present

## 2014-02-26 DIAGNOSIS — J9600 Acute respiratory failure, unspecified whether with hypoxia or hypercapnia: Secondary | ICD-10-CM

## 2014-02-26 DIAGNOSIS — J449 Chronic obstructive pulmonary disease, unspecified: Secondary | ICD-10-CM | POA: Diagnosis present

## 2014-02-26 DIAGNOSIS — Z86711 Personal history of pulmonary embolism: Secondary | ICD-10-CM

## 2014-02-26 DIAGNOSIS — N139 Obstructive and reflux uropathy, unspecified: Secondary | ICD-10-CM

## 2014-02-26 DIAGNOSIS — D751 Secondary polycythemia: Secondary | ICD-10-CM | POA: Diagnosis present

## 2014-02-26 LAB — CBC WITH DIFFERENTIAL/PLATELET
BASOS ABS: 0.1 10*3/uL (ref 0.0–0.1)
Basophils Relative: 0 % (ref 0–1)
EOS ABS: 0.4 10*3/uL (ref 0.0–0.7)
EOS PCT: 2 % (ref 0–5)
HEMATOCRIT: 44.1 % (ref 39.0–52.0)
HEMOGLOBIN: 14 g/dL (ref 13.0–17.0)
Lymphocytes Relative: 5 % — ABNORMAL LOW (ref 12–46)
Lymphs Abs: 1 10*3/uL (ref 0.7–4.0)
MCH: 27.8 pg (ref 26.0–34.0)
MCHC: 31.7 g/dL (ref 30.0–36.0)
MCV: 87.7 fL (ref 78.0–100.0)
MONO ABS: 1.4 10*3/uL — AB (ref 0.1–1.0)
MONOS PCT: 7 % (ref 3–12)
Neutro Abs: 17.1 10*3/uL — ABNORMAL HIGH (ref 1.7–7.7)
Neutrophils Relative %: 86 % — ABNORMAL HIGH (ref 43–77)
Platelets: 255 10*3/uL (ref 150–400)
RBC: 5.03 MIL/uL (ref 4.22–5.81)
RDW: 16.1 % — ABNORMAL HIGH (ref 11.5–15.5)
WBC: 19.9 10*3/uL — ABNORMAL HIGH (ref 4.0–10.5)

## 2014-02-26 LAB — COMPREHENSIVE METABOLIC PANEL
ALBUMIN: 2.7 g/dL — AB (ref 3.5–5.2)
ALT: 30 U/L (ref 0–53)
AST: 34 U/L (ref 0–37)
Alkaline Phosphatase: 47 U/L (ref 39–117)
BILIRUBIN TOTAL: 0.5 mg/dL (ref 0.3–1.2)
BUN: 30 mg/dL — AB (ref 6–23)
CALCIUM: 8.6 mg/dL (ref 8.4–10.5)
CHLORIDE: 97 meq/L (ref 96–112)
CO2: 28 mEq/L (ref 19–32)
CREATININE: 1.4 mg/dL — AB (ref 0.50–1.35)
GFR calc Af Amer: 51 mL/min — ABNORMAL LOW (ref >90)
GFR calc non Af Amer: 44 mL/min — ABNORMAL LOW (ref >90)
Glucose, Bld: 106 mg/dL — ABNORMAL HIGH (ref 70–99)
Potassium: 5.4 mEq/L — ABNORMAL HIGH (ref 3.7–5.3)
Sodium: 135 mEq/L — ABNORMAL LOW (ref 137–147)
TOTAL PROTEIN: 6.7 g/dL (ref 6.0–8.3)

## 2014-02-26 LAB — BLOOD GAS, VENOUS
Acid-Base Excess: 3.5 mmol/L — ABNORMAL HIGH (ref 0.0–2.0)
Bicarbonate: 29.2 mEq/L — ABNORMAL HIGH (ref 20.0–24.0)
Delivery systems: POSITIVE
Drawn by: 235321
O2 CONTENT: 10 L/min
O2 Saturation: 35 %
PCO2 VEN: 53.8 mmHg — AB (ref 45.0–50.0)
Patient temperature: 101
TCO2: 26.2 mmol/L (ref 0–100)
pH, Ven: 7.362 — ABNORMAL HIGH (ref 7.250–7.300)

## 2014-02-26 LAB — URINALYSIS, ROUTINE W REFLEX MICROSCOPIC
Bilirubin Urine: NEGATIVE
Glucose, UA: NEGATIVE mg/dL
Hgb urine dipstick: NEGATIVE
Ketones, ur: NEGATIVE mg/dL
LEUKOCYTES UA: NEGATIVE
NITRITE: NEGATIVE
PH: 6 (ref 5.0–8.0)
Protein, ur: 30 mg/dL — AB
Specific Gravity, Urine: 1.018 (ref 1.005–1.030)
Urobilinogen, UA: 1 mg/dL (ref 0.0–1.0)

## 2014-02-26 LAB — I-STAT CG4 LACTIC ACID, ED: LACTIC ACID, VENOUS: 1.32 mmol/L (ref 0.5–2.2)

## 2014-02-26 LAB — URINE MICROSCOPIC-ADD ON

## 2014-02-26 MED ORDER — DEXTROSE 5 % IV SOLN
500.0000 mg | Freq: Once | INTRAVENOUS | Status: AC
  Administered 2014-02-26: 500 mg via INTRAVENOUS

## 2014-02-26 MED ORDER — METHYLPREDNISOLONE SODIUM SUCC 125 MG IJ SOLR
80.0000 mg | Freq: Once | INTRAMUSCULAR | Status: AC
Start: 2014-02-26 — End: 2014-02-26
  Administered 2014-02-26: 80 mg via INTRAVENOUS
  Filled 2014-02-26: qty 2

## 2014-02-26 MED ORDER — DEXTROSE 5 % IV SOLN
1.0000 g | Freq: Once | INTRAVENOUS | Status: AC
  Administered 2014-02-26: 1 g via INTRAVENOUS
  Filled 2014-02-26: qty 10

## 2014-02-26 MED ORDER — IPRATROPIUM-ALBUTEROL 0.5-2.5 (3) MG/3ML IN SOLN
3.0000 mL | Freq: Once | RESPIRATORY_TRACT | Status: AC
  Administered 2014-02-26: 3 mL via RESPIRATORY_TRACT
  Filled 2014-02-26: qty 3

## 2014-02-26 MED ORDER — ACETAMINOPHEN 650 MG RE SUPP
650.0000 mg | Freq: Once | RECTAL | Status: AC
  Administered 2014-02-26: 650 mg via RECTAL
  Filled 2014-02-26: qty 1

## 2014-02-26 NOTE — ED Notes (Signed)
Bed: WE31 Expected date:  Expected time:  Means of arrival:  Comments: EMS 71M

## 2014-02-26 NOTE — ED Provider Notes (Signed)
CSN: [id number]     Arrival date & time 02/26/14  2212 History   First MD Initiated Contact with Patient 02/26/14 2221     Chief Complaint  Patient presents with  . Shortness of Breath     (Consider location/radiation/quality/duration/timing/severity/associated sxs/prior Treatment) HPI  This is an 80 year old male with a history of COPD, peripheral vascular disease, renal insufficiency, PE who presents with shortness of breath, cough, and fever. Patient is currently on BiPAP for increased work of breathing. Per EMS, patient has had progressive cough and shortness of breath.  Based on that review, patient was placed on amoxicillin and a prednisone taper on May 28 for a COPD exacerbation. Patient is on 8 L of O2 chronically. He denies any chest pain. He denies any leg swelling, pain. Fever noted to be 101 axillary by EMS. Temperature here is 102.2 rectally.    Level V caveat for acuity of condition  Past Medical History  Diagnosis Date  . Chronic airway obstruction, not elsewhere classified   . Unspecified essential hypertension   . Cerebrovascular disease, unspecified   . Other and unspecified hyperlipidemia   . Diaphragmatic hernia without mention of obstruction or gangrene   . Diverticulosis of colon (without mention of hemorrhage)   . Benign neoplasm of colon   . Overweight   . On home oxygen therapy   . H/O tobacco use, presenting hazards to health 01/25/2013  . Edema 04/18/2013  . OSA on CPAP   . Peripheral vascular disease     hx of DVT stopped Coumadin 06/05/2013   . Cancer     kidney cancer   . Arthritis   . Polycythemia   . DVT (deep venous thrombosis)     2014   . Pulmonary embolism   . 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 Renal insufficiency 12/23/2013   Past Surgical History  Procedure Laterality Date  . Hernia repair    . Nephroureterectomy    . Blasdder tumor removed     . Transurethral resection of bladder tumor N/A 06/25/2013    Procedure: TRANSURETHRAL RESECTION  OF BLADDER TUMOR (TURBT);  Surgeon: Claybon Jabs, MD;  Location: [facility];  Service: Urology;  Laterality: N/A;  . Hydrocele excision Right 06/25/2013    Procedure: HYDROCELECTOMY ADULT;  Surgeon: Claybon Jabs, MD;  Location: [facility];  Service: Urology;  Laterality: Right;   Family History  Problem Relation Age of Onset  . Cancer Mother     colon with mets  . COPD Father   . Hypertension Father   . Arthritis Sister   . Other Son     h/o cystitis   History  Substance Use Topics  . Smoking status: Former Smoker -- 1.50 packs/day for 36 years    Types: Cigarettes    Start date: 09/09/1945    Quit date: 09/27/1978  . Smokeless tobacco: Never Used     Comment: quit 34 years ago  . Alcohol Use: No    Review of Systems  Constitutional: Positive for fever.  HENT: Negative for sinus pressure.   Respiratory: Positive for cough, shortness of breath and wheezing. Negative for chest tightness.   Cardiovascular: Negative.  Negative for chest pain and leg swelling.  Gastrointestinal: Negative.  Negative for nausea, vomiting and abdominal pain.  Genitourinary: Negative.  Negative for dysuria.  Musculoskeletal: Negative for back pain.  Skin: Negative for rash.  Neurological: Negative for headaches.  Psychiatric/Behavioral: Negative for confusion.  All other systems reviewed and are negative.  Allergies  Sulfamethoxazole  Home Medications   Prior to Admission medications   Medication Sig Start Date End Date Taking? Authorizing Provider  acetaminophen (TYLENOL ARTHRITIS PAIN) 650 MG CR tablet Take 650 mg by mouth every morning.     Historical Provider, MD  ADVAIR DISKUS 250-50 MCG/DOSE AEPB USE ONE INHALATION TWICE A DAY. 01/09/14   Mosie Lukes, MD  albuterol (PROVENTIL) (2.5 MG/3ML) 0.083% nebulizer solution Take 3 mLs (2.5 mg total) by nebulization every 6 (six) hours as needed for wheezing or shortness of breath. Dx 496 02/11/14   Rigoberto Noel, MD  amoxicillin-clavulanate  (AUGMENTIN) 875-125 MG per tablet Take 1 tablet by mouth 2 (two) times daily. 02/21/14 02/28/14  Tammy S Parrett, NP  aspirin 81 MG tablet Take 81 mg by mouth 2 (two) times daily.     Historical Provider, MD  furosemide (LASIX) 40 MG tablet TAKE ONE TABLET DAILY IN THE MORNING 12/17/13   Rigoberto Noel, MD  guaiFENesin (MUCINEX) 600 MG 12 hr tablet Take 1,200 mg by mouth 2 (two) times daily.     Historical Provider, MD  losartan (COZAAR) 25 MG tablet 1/2 tab po qd 10/11/13   Mosie Lukes, MD  Omega-3 Fatty Acids (FISH OIL) 1200 MG CAPS Take 1,200 mg by mouth every morning.    Historical Provider, MD  predniSONE (DELTASONE) 10 MG tablet Take 4 tabs daily x 4 days, 3 tabs daily x 4 days, 2 tabs daily x 4 days, 1 tab daily x 4 days 02/07/14   Rigoberto Noel, MD  psyllium (METAMUCIL) 58.6 % packet Take 1 packet by mouth daily.    Historical Provider, MD  simvastatin (ZOCOR) 40 MG tablet TAKE ONE (1) TABLET EACH DAY 12/17/13   Mosie Lukes, MD  SPIRIVA HANDIHALER 18 MCG inhalation capsule INHALE THE CONTENTS OF ONE CAPSULE DAILY 02/11/14   Mosie Lukes, MD   BP 134/82  Pulse 107  Temp(Src) 102.2 F (39 C) (Rectal)  Resp 24  SpO2 100% Physical Exam  Nursing note and vitals reviewed. Constitutional: He is oriented to person, place, and time.  Elderly, BiPAP in place, increased work of breathing  HENT:  Head: Normocephalic and atraumatic.  Eyes: Pupils are equal, round, and reactive to light.  Neck: Neck supple. No JVD present.  Cardiovascular: Normal rate, regular rhythm and normal heart sounds.   No murmur heard. Pulmonary/Chest: Effort normal. No respiratory distress. He has wheezes. He has rales.  Abdominal: Soft. Bowel sounds are normal. There is no tenderness. There is no rebound.  Musculoskeletal: He exhibits no edema.  Neurological: He is alert and oriented to person, place, and time.  Skin: Skin is warm and dry.  Psychiatric: He has a normal mood and affect.    ED Course  Procedures  (including critical care time)  CRITICAL CARE Performed by: Merryl Hacker   Total critical care time: 45 min  Critical care time was exclusive of separately billable procedures and treating other patients.  Critical care was necessary to treat or prevent imminent or life-threatening deterioration.  Critical care was time spent personally by me on the following activities: development of treatment plan with patient and/or surrogate as well as nursing, discussions with consultants, evaluation of patient's response to treatment, examination of patient, obtaining history from patient or surrogate, ordering and performing treatments and interventions, ordering and review of laboratory studies, ordering and review of radiographic studies, pulse oximetry and re-evaluation of patient's condition.  Labs Review Labs Reviewed  CBC WITH DIFFERENTIAL - Abnormal; Notable for the following:    WBC 19.9 (*)    RDW 16.1 (*)    Neutrophils Relative % 86 (*)    Neutro Abs 17.1 (*)    Lymphocytes Relative 5 (*)    Monocytes Absolute 1.4 (*)    All other components within normal limits  COMPREHENSIVE METABOLIC PANEL - Abnormal; Notable for the following:    Sodium 135 (*)    Potassium 5.4 (*)    Glucose, Bld 106 (*)    BUN 30 (*)    Creatinine, Ser 1.40 (*)    Albumin 2.7 (*)    GFR calc non Af Amer 44 (*)    GFR calc Af Amer 51 (*)    All other components within normal limits  URINALYSIS, ROUTINE W REFLEX MICROSCOPIC - Abnormal; Notable for the following:    Protein, ur 30 (*)    All other components within normal limits  BLOOD GAS, VENOUS - Abnormal; Notable for the following:    pH, Ven 7.362 (*)    pCO2, Ven 53.8 (*)    Bicarbonate 29.2 (*)    Acid-Base Excess 3.5 (*)    All other components within normal limits  CULTURE, BLOOD (ROUTINE X 2)  CULTURE, BLOOD (ROUTINE X 2)  URINE CULTURE  URINE MICROSCOPIC-ADD ON  I-STAT CG4 LACTIC ACID, ED    Imaging Review Dg Chest Port 1  View  02/26/2014   CLINICAL DATA:  Shortness of breath.  History pulmonary embolism  EXAM: PORTABLE CHEST - 1 VIEW  COMPARISON:  02/21/2014  FINDINGS: Likely no cardiomegaly when accounting for mediastinal fat. Stable upper mediastinal contours, with hilar prominence likely vascular.  Pulmonary hyperinflation and emphysematous change. Scarring present at both bases. There is no evidence of superimposed pneumonia, edema, effusion, or pneumothorax. Note that a small volume of the left basilar lung is likely excluded from view.  IMPRESSION: Emphysema without superimposed pneumonia or edema.   Electronically Signed   By: Jorje Guild M.D.   On: 02/26/2014 22:46     EKG Interpretation   Date/Time:  Tuesday February 26 2014 22:20:01 EDT Ventricular Rate:  107 PR Interval:  147 QRS Duration: 84 QT Interval:  322 QTC Calculation: 430 R Axis:   49 Text Interpretation:  Sinus tachycardia Borderline low voltage, extremity  leads Confirmed by Rithik Odea  MD, Loma Sousa ([id number]) on 02/26/2014 11:27:44 PM      MDM   Final diagnoses:  CAP (community acquired pneumonia)  COPD (chronic obstructive pulmonary disease)  Fever    Patient presents with fever, cough, shortness of breath. Patient is currently on BiPAP. He is satting well on BiPAP. Currently being treated as an outpatient for COPD exacerbation with amoxicillin and a prednisone taper. Given pulmonary exam, patient was given Solu-Medrol and a duo neb. Noted to be febrile to 102.2. Sepsis workup initiated. Chest x-ray shows no infiltrate but given fever and cough with increased work of breathing, suspect pneumonia.  Patient has no risk factors for health care card pneumonia and was in assisted living. Patient will be given Rocephin and azithromycin. Vital signs are otherwise stable.  Lactate is normal.  Patient does have a history of pulmonary embolism; however, given fever and leukocytosis, I feel patient's symptoms are most likely infectious in nature. He has  no obvious signs of DVT.  Patient to be admitted to the hospitalist.    Merryl Hacker, MD 02/26/14 2345

## 2014-02-26 NOTE — Progress Notes (Signed)
Patient presented already on CPAP, which he uses at the ALF along with 8 liters supplemental O2. Placed on Auto titration BiPAP with 10 liter O2 bleed in. Vitals stable at this time. Patient agrees the pressure feels comfortable.

## 2014-02-26 NOTE — Progress Notes (Signed)
Placed patient on 8 liter nasal canula for trial. SpO2 dropped to low 80's with increase in respirations despite using deep breathing technique. Placed back on auto titration BiPAP with previous settings and 8 liter O2 bleed in. SpO2 now 92% with RR 19.

## 2014-02-26 NOTE — ED Notes (Signed)
Respiratory at bedside.

## 2014-02-26 NOTE — ED Notes (Signed)
Pt's wife will take all belongings home with her. Acknowledges responsibility.

## 2014-02-26 NOTE — ED Notes (Addendum)
Per EMS- from assisted living facility. Has been having SOB x3 days. Being treated for unknown illness with amoxicillin and has step down prednisone. Diminished lower lobe lung sounds. Constant coughing. Bilateral rhonchi lung sounds. Placed on CPAP. 18 G PIV in left AC. No medications given. Denies chest pain at this moment. Hx pulmonary edema, COPD, resp failure. VS: BP 101/67 HR 108 RR 35 Temp 101 axillary.

## 2014-02-27 ENCOUNTER — Encounter (HOSPITAL_COMMUNITY): Payer: Self-pay | Admitting: Internal Medicine

## 2014-02-27 ENCOUNTER — Inpatient Hospital Stay (HOSPITAL_COMMUNITY): Payer: Medicare Other

## 2014-02-27 DIAGNOSIS — J449 Chronic obstructive pulmonary disease, unspecified: Secondary | ICD-10-CM

## 2014-02-27 DIAGNOSIS — R509 Fever, unspecified: Secondary | ICD-10-CM

## 2014-02-27 DIAGNOSIS — J189 Pneumonia, unspecified organism: Principal | ICD-10-CM

## 2014-02-27 DIAGNOSIS — J9600 Acute respiratory failure, unspecified whether with hypoxia or hypercapnia: Secondary | ICD-10-CM | POA: Insufficient documentation

## 2014-02-27 LAB — MRSA PCR SCREENING: MRSA BY PCR: NEGATIVE

## 2014-02-27 LAB — BASIC METABOLIC PANEL
BUN: 30 mg/dL — ABNORMAL HIGH (ref 6–23)
CALCIUM: 8.6 mg/dL (ref 8.4–10.5)
CO2: 26 meq/L (ref 19–32)
Chloride: 98 mEq/L (ref 96–112)
Creatinine, Ser: 1.45 mg/dL — ABNORMAL HIGH (ref 0.50–1.35)
GFR calc Af Amer: 49 mL/min — ABNORMAL LOW (ref >90)
GFR, EST NON AFRICAN AMERICAN: 42 mL/min — AB (ref >90)
Glucose, Bld: 143 mg/dL — ABNORMAL HIGH (ref 70–99)
Potassium: 5.1 mEq/L (ref 3.7–5.3)
SODIUM: 136 meq/L — AB (ref 137–147)

## 2014-02-27 LAB — TSH: TSH: 1.24 u[IU]/mL (ref 0.350–4.500)

## 2014-02-27 LAB — TROPONIN I
Troponin I: <0.3 ng/mL (ref <0.30)
Troponin I: <0.3 ng/mL (ref <0.30)

## 2014-02-27 LAB — CBC
HCT: 44.2 % (ref 39.0–52.0)
Hemoglobin: 14.2 g/dL (ref 13.0–17.0)
MCH: 27.9 pg (ref 26.0–34.0)
MCHC: 32.1 g/dL (ref 30.0–36.0)
MCV: 86.8 fL (ref 78.0–100.0)
Platelets: 222 10*3/uL (ref 150–400)
RBC: 5.09 MIL/uL (ref 4.22–5.81)
RDW: 15.9 % — ABNORMAL HIGH (ref 11.5–15.5)
WBC: 19.2 10*3/uL — AB (ref 4.0–10.5)

## 2014-02-27 LAB — STREP PNEUMONIAE URINARY ANTIGEN: Strep Pneumo Urinary Antigen: NEGATIVE

## 2014-02-27 LAB — INFLUENZA PANEL BY PCR (TYPE A & B)
H1N1FLUPCR: NOT DETECTED
INFLAPCR: NEGATIVE
Influenza B By PCR: NEGATIVE

## 2014-02-27 LAB — LEGIONELLA ANTIGEN, URINE: LEGIONELLA ANTIGEN, URINE: NEGATIVE

## 2014-02-27 LAB — D-DIMER, QUANTITATIVE: D-Dimer, Quant: 1.62 ug/mL-FEU — ABNORMAL HIGH (ref 0.00–0.48)

## 2014-02-27 MED ORDER — BENZONATATE 100 MG PO CAPS
100.0000 mg | ORAL_CAPSULE | Freq: Three times a day (TID) | ORAL | Status: DC
  Administered 2014-02-27 – 2014-03-02 (×9): 100 mg via ORAL
  Filled 2014-02-27 (×11): qty 1

## 2014-02-27 MED ORDER — DEXTROSE 5 % IV SOLN
500.0000 mg | Freq: Every day | INTRAVENOUS | Status: DC
  Administered 2014-02-27 – 2014-03-01 (×3): 500 mg via INTRAVENOUS
  Filled 2014-02-27 (×3): qty 500

## 2014-02-27 MED ORDER — TIOTROPIUM BROMIDE MONOHYDRATE 18 MCG IN CAPS
18.0000 ug | ORAL_CAPSULE | Freq: Every day | RESPIRATORY_TRACT | Status: DC
  Administered 2014-02-27: 18 ug via RESPIRATORY_TRACT
  Filled 2014-02-27: qty 5

## 2014-02-27 MED ORDER — ENOXAPARIN SODIUM 40 MG/0.4ML ~~LOC~~ SOLN
40.0000 mg | SUBCUTANEOUS | Status: DC
  Administered 2014-02-27 – 2014-02-28 (×2): 40 mg via SUBCUTANEOUS
  Filled 2014-02-27 (×2): qty 0.4

## 2014-02-27 MED ORDER — IOHEXOL 350 MG/ML SOLN
100.0000 mL | Freq: Once | INTRAVENOUS | Status: AC | PRN
  Administered 2014-02-27: 100 mL via INTRAVENOUS

## 2014-02-27 MED ORDER — PSYLLIUM 95 % PO PACK
1.0000 | PACK | Freq: Every day | ORAL | Status: DC
  Administered 2014-02-27 – 2014-03-02 (×4): 1 via ORAL
  Filled 2014-02-27 (×4): qty 1

## 2014-02-27 MED ORDER — SIMVASTATIN 40 MG PO TABS
40.0000 mg | ORAL_TABLET | Freq: Every day | ORAL | Status: DC
  Administered 2014-02-27 – 2014-02-28 (×2): 40 mg via ORAL
  Filled 2014-02-27 (×3): qty 1

## 2014-02-27 MED ORDER — IPRATROPIUM-ALBUTEROL 0.5-2.5 (3) MG/3ML IN SOLN
3.0000 mL | RESPIRATORY_TRACT | Status: DC
  Administered 2014-02-27 – 2014-03-01 (×14): 3 mL via RESPIRATORY_TRACT
  Filled 2014-02-27 (×15): qty 3

## 2014-02-27 MED ORDER — DEXTROSE 5 % IV SOLN
1.0000 g | Freq: Every day | INTRAVENOUS | Status: DC
  Administered 2014-02-27 – 2014-03-01 (×3): 1 g via INTRAVENOUS
  Filled 2014-02-27 (×3): qty 10

## 2014-02-27 MED ORDER — METHYLPREDNISOLONE SODIUM SUCC 125 MG IJ SOLR: 60.0000 mg | Freq: Four times a day (QID) | INTRAMUSCULAR | Status: DC

## 2014-02-27 MED ORDER — ONDANSETRON HCL 4 MG PO TABS: 4.0000 mg | ORAL_TABLET | Freq: Four times a day (QID) | ORAL | Status: DC | PRN

## 2014-02-27 MED ORDER — GUAIFENESIN-CODEINE 100-10 MG/5ML PO SOLN: 5.0000 mL | Freq: Four times a day (QID) | ORAL | Status: DC | PRN

## 2014-02-27 MED ORDER — FUROSEMIDE 40 MG PO TABS
40.0000 mg | ORAL_TABLET | Freq: Every day | ORAL | Status: DC
  Administered 2014-02-27 – 2014-03-01 (×3): 40 mg via ORAL
  Filled 2014-02-27 (×5): qty 1

## 2014-02-27 MED ORDER — ASPIRIN EC 81 MG PO TBEC
81.0000 mg | DELAYED_RELEASE_TABLET | Freq: Two times a day (BID) | ORAL | Status: DC
  Administered 2014-02-27 – 2014-03-02 (×7): 81 mg via ORAL
  Filled 2014-02-27 (×8): qty 1

## 2014-02-27 MED ORDER — MOMETASONE FURO-FORMOTEROL FUM 100-5 MCG/ACT IN AERO
2.0000 | INHALATION_SPRAY | Freq: Two times a day (BID) | RESPIRATORY_TRACT | Status: DC
  Administered 2014-02-27 – 2014-03-01 (×5): 2 via RESPIRATORY_TRACT
  Filled 2014-02-27: qty 8.8

## 2014-02-27 MED ORDER — SODIUM CHLORIDE 0.9 % IJ SOLN
3.0000 mL | Freq: Two times a day (BID) | INTRAMUSCULAR | Status: DC
  Administered 2014-02-27 – 2014-03-02 (×6): 3 mL via INTRAVENOUS

## 2014-02-27 MED ORDER — LOSARTAN POTASSIUM 25 MG PO TABS
12.5000 mg | ORAL_TABLET | Freq: Every day | ORAL | Status: DC
  Administered 2014-02-27: 12.5 mg via ORAL
  Filled 2014-02-27: qty 0.5

## 2014-02-27 MED ORDER — GUAIFENESIN ER 600 MG PO TB12
1200.0000 mg | ORAL_TABLET | Freq: Two times a day (BID) | ORAL | Status: DC
  Administered 2014-02-27 – 2014-03-02 (×7): 1200 mg via ORAL
  Filled 2014-02-27 (×8): qty 2

## 2014-02-27 MED ORDER — ALBUTEROL SULFATE (2.5 MG/3ML) 0.083% IN NEBU: 2.5000 mg | INHALATION_SOLUTION | RESPIRATORY_TRACT | Status: DC | PRN

## 2014-02-27 MED ORDER — ONDANSETRON HCL 4 MG/2ML IJ SOLN: 4.0000 mg | Freq: Four times a day (QID) | INTRAMUSCULAR | Status: DC | PRN

## 2014-02-27 MED ORDER — ACETAMINOPHEN 650 MG RE SUPP: 650.0000 mg | Freq: Four times a day (QID) | RECTAL | Status: DC | PRN

## 2014-02-27 MED ORDER — ACETAMINOPHEN 325 MG PO TABS: 650.0000 mg | ORAL_TABLET | Freq: Four times a day (QID) | ORAL | Status: DC | PRN

## 2014-02-27 MED ORDER — ALBUTEROL SULFATE (2.5 MG/3ML) 0.083% IN NEBU: 2.5000 mg | INHALATION_SOLUTION | Freq: Four times a day (QID) | RESPIRATORY_TRACT | Status: DC | PRN

## 2014-02-27 MED ORDER — METHYLPREDNISOLONE SODIUM SUCC 125 MG IJ SOLR
60.0000 mg | Freq: Two times a day (BID) | INTRAMUSCULAR | Status: DC
  Administered 2014-02-27 – 2014-02-28 (×2): 60 mg via INTRAVENOUS
  Filled 2014-02-27 (×2): qty 2

## 2014-02-27 MED ORDER — FUROSEMIDE 20 MG PO TABS
20.0000 mg | ORAL_TABLET | Freq: Every day | ORAL | Status: DC
  Administered 2014-02-27 – 2014-02-28 (×2): 20 mg via ORAL
  Filled 2014-02-27 (×3): qty 1

## 2014-02-27 MED ORDER — OMEGA-3-ACID ETHYL ESTERS 1 G PO CAPS
1000.0000 mg | ORAL_CAPSULE | Freq: Every morning | ORAL | Status: DC
Start: 2014-02-27 — End: 2014-03-02
  Administered 2014-02-27 – 2014-03-02 (×4): 1000 mg via ORAL
  Filled 2014-02-27 (×4): qty 1

## 2014-02-27 MED ORDER — BIOTENE DRY MOUTH MT LIQD
15.0000 mL | Freq: Two times a day (BID) | OROMUCOSAL | Status: DC
  Administered 2014-02-27 – 2014-03-02 (×8): 15 mL via OROMUCOSAL

## 2014-02-27 NOTE — H&P (Signed)
[facility] History and Physical  Tracy [name] DOB: 1926/09/13 DOA: 02/26/2014  Referring physician: ER physician. PCP: Penni Homans, MD  Specialists: Dr. Elsworth Soho. Pulmonologist.  Chief Complaint: Shortness of breath.  HPI: Tracy [name] is a 80 y.o. male history of COPD on home oxygen, OSA, cor pulmonale, hypertension, previous history of PE off Coumadin now, secondary polycythemia lives in the assisted living facility started experiencing worsening shortness of breath last evening with productive cough. Patient has been experiencing more than usual shortness of breath over the last 2 weeks. Last evening after supper when patient was walking suddenly patient became very short of breath and hypoxic. Denies any associated chest pain dizziness palpitations nausea vomiting diaphoresis. Patient was brought in the ER and placed on BiPAP and was found to be febrile and blood work shows leukocytosis. Chest x-ray does not show any definite infiltrates. But given patient's fever productive cough and leukocytosis patient was started on antibiotics for possible pneumonia. Patient also has bruise on his abdomen. Which patient states he developed after continuous cough. Denies any abdominal pain nausea vomiting diarrhea. Denies any trauma to the abdomen.   Review of Systems: As presented in the history of presenting illness, rest negative.  Past Medical History  Diagnosis Date  . Chronic airway obstruction, not elsewhere classified   . Unspecified essential hypertension   . Cerebrovascular disease, unspecified   . Other and unspecified hyperlipidemia   . Diaphragmatic hernia without mention of obstruction or gangrene   . Diverticulosis of colon (without mention of hemorrhage)   . Benign neoplasm of colon   . Overweight   . On home oxygen therapy   . H/O tobacco use, presenting hazards to health 01/25/2013  . Edema 04/18/2013  . OSA on CPAP   . Peripheral vascular disease     hx of  DVT stopped Coumadin 06/05/2013   . Cancer     kidney cancer   . Arthritis   . Polycythemia   . DVT (deep venous thrombosis)     2014   . Pulmonary embolism   . 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 Renal insufficiency 12/23/2013   Past Surgical History  Procedure Laterality Date  . Hernia repair    . Nephroureterectomy    . Blasdder tumor removed     . Transurethral resection of bladder tumor N/A 06/25/2013    Procedure: TRANSURETHRAL RESECTION OF BLADDER TUMOR (TURBT);  Surgeon: Claybon Jabs, MD;  Location: [facility];  Service: Urology;  Laterality: N/A;  . Hydrocele excision Right 06/25/2013    Procedure: HYDROCELECTOMY ADULT;  Surgeon: Claybon Jabs, MD;  Location: [facility];  Service: Urology;  Laterality: Right;   Social History:  reports that he quit smoking about 35 years ago. His smoking use included Cigarettes. He started smoking about 68 years ago. He has a 54 pack-year smoking history. He has never used smokeless tobacco. He reports that he does not drink alcohol or use illicit drugs. Where does patient live assisted living facility.  Can patient participate in ADLs? Yes.  Allergies  Allergen Reactions  . Sulfamethoxazole Other (See Comments)    REACTION: "washes" him out per pt    Family History:  Family History  Problem Relation Age of Onset  . Cancer Mother     colon with mets  . COPD Father   . Hypertension Father   . Arthritis Sister   . Other Son     h/o cystitis      Prior to Admission  medications   Medication Sig Start Date End Date Taking? Authorizing Provider  acetaminophen (TYLENOL ARTHRITIS PAIN) 650 MG CR tablet Take 650 mg by mouth every morning.    Yes Historical Provider, MD  ADVAIR DISKUS 250-50 MCG/DOSE AEPB USE ONE INHALATION TWICE A DAY. 01/09/14  Yes Mosie Lukes, MD  albuterol (PROVENTIL) (2.5 MG/3ML) 0.083% nebulizer solution Take 3 mLs (2.5 mg total) by nebulization every 6 (six) hours as needed for wheezing or shortness of breath. Dx 496  02/11/14  Yes Rigoberto Noel, MD  amoxicillin-clavulanate (AUGMENTIN) 875-125 MG per tablet Take 1 tablet by mouth 2 (two) times daily. 02/21/14 02/28/14 Yes Tammy S Parrett, NP  aspirin 81 MG tablet Take 81 mg by mouth 2 (two) times daily.    Yes Historical Provider, MD  furosemide (LASIX) 40 MG tablet TAKE ONE TABLET DAILY IN THE MORNING 12/17/13  Yes Rigoberto Noel, MD  furosemide (LASIX) 40 MG tablet Take 20-40 mg by mouth 2 (two) times daily. 40 mg in the morning and 20 mg in the evening   Yes Historical Provider, MD  guaiFENesin (MUCINEX) 600 MG 12 hr tablet Take 1,200 mg by mouth 2 (two) times daily.    Yes Historical Provider, MD  losartan (COZAAR) 25 MG tablet Take 12.5 mg by mouth daily.   Yes Historical Provider, MD  Omega-3 Fatty Acids (FISH OIL) 1200 MG CAPS Take 1,200 mg by mouth every morning.   Yes Historical Provider, MD  psyllium (METAMUCIL) 58.6 % packet Take 1 packet by mouth daily.   Yes Historical Provider, MD  simvastatin (ZOCOR) 40 MG tablet TAKE ONE (1) TABLET EACH DAY 12/17/13  Yes Mosie Lukes, MD  SPIRIVA HANDIHALER 18 MCG inhalation capsule INHALE THE CONTENTS OF ONE CAPSULE DAILY 02/11/14  Yes Mosie Lukes, MD    Physical Exam: Filed Vitals:   02/26/14 2300 02/27/14 0030 02/27/14 0059 02/27/14 0130  BP:   114/68 114/68  Pulse:    95  Temp:  101.5 F (38.6 C) 100.1 F (37.8 C)   TempSrc:  Rectal Oral   Resp:   22 22  Height:   5\' 10"  (1.778 m)   Weight:   91.9 kg (202 lb 9.6 oz)   SpO2: 100%  91% 91%     General:  Well-developed and nourished.   Eyes: Anicteric no pallor.   ENT: No discharge from the ears eyes nose mouth.   Neck: No mass felt.   Cardiovascular: S1-S2 heard.   Respiratory: No rhonchi or crepitations.   Abdomen: Soft nontender bowel sounds present. Infraumbilical bruise present.   Skin: Infraumbilical bruise present.   Musculoskeletal: No edema.   Psychiatric:Appears normal.   Neurologic: Alert awake oriented to time place and  person. Moves all extremities.   Labs on Admission:  Basic Metabolic Panel:  Recent Labs Lab 02/26/14 2228  NA 135*  K 5.4*  CL 97  CO2 28  GLUCOSE 106*  BUN 30*  CREATININE 1.40*  CALCIUM 8.6   Liver Function Tests:  Recent Labs Lab 02/26/14 2228  AST 34  ALT 30  ALKPHOS 47  BILITOT 0.5  PROT 6.7  ALBUMIN 2.7*   No results found for this basename: LIPASE, AMYLASE,  in the last 168 hours No results found for this basename: AMMONIA,  in the last 168 hours CBC:  Recent Labs Lab 02/26/14 2228  WBC 19.9*  NEUTROABS 17.1*  HGB 14.0  HCT 44.1  MCV 87.7  PLT 255   Cardiac Enzymes:  No results found for this basename: CKTOTAL, CKMB, CKMBINDEX, TROPONINI,  in the last 168 hours  BNP (last 3 results) No results found for this basename: PROBNP,  in the last 8760 hours CBG: No results found for this basename: GLUCAP,  in the last 168 hours  Radiological Exams on Admission: Dg Chest Port 1 View  02/26/2014   CLINICAL DATA:  Shortness of breath.  History pulmonary embolism  EXAM: PORTABLE CHEST - 1 VIEW  COMPARISON:  02/21/2014  FINDINGS: Likely no cardiomegaly when accounting for mediastinal fat. Stable upper mediastinal contours, with hilar prominence likely vascular.  Pulmonary hyperinflation and emphysematous change. Scarring present at both bases. There is no evidence of superimposed pneumonia, edema, effusion, or pneumothorax. Note that a small volume of the left basilar lung is likely excluded from view.  IMPRESSION: Emphysema without superimposed pneumonia or edema.   Electronically Signed   By: Jorje Guild M.D.   On: 02/26/2014 22:46    EKG: Independently reviewed. Sinus tachycardia.   Assessment/Plan Principal Problem:   Acute respiratory failure Active Problems:   OBSTRUCTIVE SLEEP APNEA   CAP (community acquired pneumonia)   COPD (chronic obstructive pulmonary disease)   1. Acute respiratory failure - suspect possibly secondary to pneumonia. Continue  ceftriaxone and Zithromax. Check urine for Legionella strep antigen. Check influenza PCR. Since patient has had previous history of PE I have ordered d-dimer if positive needs VQ scan. Consulted PC CM. 2. COPD - presently not wheezing. Continue inhalers. 3. Chronic kidney disease with mild hypokalemia  - if potassium worsens then may have to hold Cozaar. 4. History of PE - see #1. 5. History of cor pulmonale - on Lasix. 6. Hypertension - see #3 regarding Cozaar.    Code Status: Full code.   Family Communication: None.   Disposition Plan: Admit to inpatient.    [facility] Pager [phone].  If 7PM-7AM, please contact night-coverage [email or web address] Password [email or web address] 02/27/2014, 1:46 AM

## 2014-02-27 NOTE — ED Notes (Signed)
RT stated she tried to place pt on 8L [place] per admitting MD request, pt dropped in low 80's, pt placed back on Bipap per RT.

## 2014-02-27 NOTE — Progress Notes (Signed)
Bathed and linens changed.O2 sats decreased to 57-60. Pt expectorated large amount clear sputum.Sats increased to 88 over 5 minutes.

## 2014-02-27 NOTE — Progress Notes (Signed)
Patient ID: Tracy [name]  male  ZDG:[id number]    DOB: 1926-08-26    DOA: 02/26/2014  PCP: Penni Homans, MD  Assessment/Plan: Principal Problem:   Acute on chronic respiratory failure secondary to COPD exacerbation, CAP, OSA - Off BiPAP, influenza negative, urine strep antigen negative - Continue to Zithromax, Rocephin, nebulizer scheduled, Tessalon Perles, Robitussin as needed  Active Problems:   OBSTRUCTIVE SLEEP APNEA  - continue CPAP/BiPAP per respiratory therapist    CAP (community acquired pneumonia) - As #1   History of PE - D-dimer elevated, we'll order VQ scan to rule out any PE  Hypertension: -Hold ARB for now    COPD (chronic obstructive pulmonary disease) - As #1    DVT Prophylaxis: Lovenox   Code Status: Full code   Family Communication: patient alert and oriented x4, discussed with the patient   Disposition: monitor inSDU today   Consultants:  None   Procedures:  None   Antibiotics:  IV Rocephin    IV Zithromax   Subjective: Patient seen and examined, off BiPAP, currently on 8 L O2 via nasal cannula, states that his baseline O2 use at home   Objective: Weight change:   Intake/Output Summary (Last 24 hours) at 02/27/14 1017 Last data filed at 02/27/14 0924  Gross per 24 hour  Intake    390 ml  Output    200 ml  Net    190 ml   Blood pressure 107/58, pulse 91, temperature 97.7 F (36.5 C), temperature source Oral, resp. rate 21, height 5\' 10"  (1.778 m), weight 92.6 kg (204 lb 2.3 oz), SpO2 92.00%.  Physical Exam: General: Alert and awake, oriented x3, not in any acute distress. CVS: S1-S2 clear, no murmur rubs or gallops Chest:  scattered wheezing and rhonchi bilaterally  Abdomen: soft nontender, nondistended, normal bowel sounds  Extremities: no cyanosis, clubbing or edema noted bilaterally Neuro: Cranial nerves II-XII intact, no focal neurological deficits  Lab Results: Basic Metabolic Panel:  Recent Labs Lab 02/26/14 2228  02/27/14 0258  NA 135* 136*  K 5.4* 5.1  CL 97 98  CO2 28 26  GLUCOSE 106* 143*  BUN 30* 30*  CREATININE 1.40* 1.45*  CALCIUM 8.6 8.6   Liver Function Tests:  Recent Labs Lab 02/26/14 2228  AST 34  ALT 30  ALKPHOS 47  BILITOT 0.5  PROT 6.7  ALBUMIN 2.7*   No results found for this basename: LIPASE, AMYLASE,  in the last 168 hours No results found for this basename: AMMONIA,  in the last 168 hours CBC:  Recent Labs Lab 02/26/14 2228 02/27/14 0258  WBC 19.9* 19.2*  NEUTROABS 17.1*  --   HGB 14.0 14.2  HCT 44.1 44.2  MCV 87.7 86.8  PLT 255 222   Cardiac Enzymes:  Recent Labs Lab 02/27/14 0258 02/27/14 0835  TROPONINI <0.30 <0.30   BNP: No components found with this basename: POCBNP,  CBG: No results found for this basename: GLUCAP,  in the last 168 hours   Micro Results: Recent Results (from the past 240 hour(s))  MRSA PCR SCREENING     Status: None   Collection Time    02/27/14  1:25 AM      Result Value Ref Range Status   MRSA by PCR NEGATIVE  NEGATIVE Final   Comment:            The GeneXpert MRSA Assay (FDA     approved for NASAL specimens     only), is one component  of a     comprehensive MRSA colonization     surveillance program. It is not     intended to diagnose MRSA     infection nor to guide or     monitor treatment for     MRSA infections.    Studies/Results: Dg Chest 2 View  02/21/2014   CLINICAL DATA:  Cough, former smoking history, COPD  EXAM: CHEST  2 VIEW  COMPARISON:  Chest x-ray of 10/11/2013  FINDINGS: The lungs are hyperaerated with somewhat flattened hemidiaphragms suggesting emphysema. There are prominent markings primarily at the lung bases the majority of which appear chronic. There are somewhat more prominent markings on the lateral view in the lung base posteriorly and superimposed pneumonia would be difficult to exclude. Followup chest x-ray is recommended if warranted clinically. No effusion is seen. The heart is  mildly enlarged and stable. There are degenerative changes throughout the thoracic spine.  IMPRESSION: 1. Emphysema and probable basilar fibrosis. Somewhat more prominent markings on the lateral view at the lung base could represent superimposed pneumonia and followup is recommended if warranted clinically. 2. Stable cardiomegaly.   Electronically Signed   By: Ivar Drape M.D.   On: 02/21/2014 10:17   Dg Chest Port 1 View  02/26/2014   CLINICAL DATA:  Shortness of breath.  History pulmonary embolism  EXAM: PORTABLE CHEST - 1 VIEW  COMPARISON:  02/21/2014  FINDINGS: Likely no cardiomegaly when accounting for mediastinal fat. Stable upper mediastinal contours, with hilar prominence likely vascular.  Pulmonary hyperinflation and emphysematous change. Scarring present at both bases. There is no evidence of superimposed pneumonia, edema, effusion, or pneumothorax. Note that a small volume of the left basilar lung is likely excluded from view.  IMPRESSION: Emphysema without superimposed pneumonia or edema.   Electronically Signed   By: Jorje Guild M.D.   On: 02/26/2014 22:46    Medications: Scheduled Meds: . antiseptic oral rinse  15 mL Mouth Rinse BID  . aspirin EC  81 mg Oral BID  . azithromycin  500 mg Intravenous QHS  . cefTRIAXone (ROCEPHIN)  IV  1 g Intravenous QHS  . enoxaparin (LOVENOX) injection  40 mg Subcutaneous Q24H  . furosemide  20 mg Oral q1800  . furosemide  40 mg Oral QAC breakfast  . guaiFENesin  1,200 mg Oral BID  . losartan  12.5 mg Oral Daily  . mometasone-formoterol  2 puff Inhalation BID  . omega-3 acid ethyl esters  1,000 mg Oral q morning - 10a  . psyllium  1 packet Oral Daily  . simvastatin  40 mg Oral q1800  . sodium chloride  3 mL Intravenous Q12H  . tiotropium  18 mcg Inhalation Daily      LOS: 1 day   [name] M.D. [facility] 02/27/2014, 10:17 AM Pager: [phone]  If 7PM-7AM, please contact night-coverage [email or web address] Password  [email or web address]  **Disclaimer: This note was dictated with voice recognition software. Similar sounding words can inadvertently be transcribed and this note may contain transcription errors which may not have been corrected upon publication of note.**

## 2014-02-27 NOTE — Plan of Care (Signed)
Problem: Phase I Progression Outcomes Goal: O2 sats > or equal 90% or at baseline Outcome: Progressing Pt on 8 l nasal cannula as at home.

## 2014-02-27 NOTE — Consult Note (Signed)
PULMONARY / CRITICAL CARE MEDICINE   Name: Tracy [name] MRN: [id number] DOB: 11/16/1925    ADMISSION DATE:  02/26/2014 CONSULTATION DATE: 6/3  REFERRING MD : [facility] PRIMARY SERVICE: [facility]  CHIEF COMPLAINT:  Dyspnea   BRIEF PATIENT DESCRIPTION:  80 yo male who is followed by Dr. Elsworth Soho for OSA/COPD/spiculated lung mass/hx of PE and reports breathing difficulties x 3 weeks without fever . Chills, sweats or purulent sputum production. He notes sudden increase in dyspnea just prior to admission. Treated 5/28 per NP at Pulmonary office for AECOPD with no pna on c x r. PFT 4/10 showed FVC= 3.00 (77%), FEV1= 1.37 (45%) ,right lower lobe, 7 mm spiculated lung lesion in the right middle lobe by ct scan 2013. PCCM asked to evaluate. With spiculated mass, hx of thromboembolic disease then evaluation for PE seems reasonable. He has bullous empysema and copd by spirometry and c t scan.  SIGNIFICANT EVENTS / STUDIES:    LINES / TUBES:   CULTURES: 6/2 bc>> 6/2 uc>>  ANTIBIOTICS: 6/2 roc>> 6/2 zithro>>  HISTORY OF PRESENT ILLNESS:    80 yo male who is followed by Dr. Elsworth Soho for OSA/COPD/spiculated lung mass/hx of PE and reports breathing difficulties x 3 weeks without fever . Chills, sweats or purulent sputum production. He notes sudden increase in dyspnea just prior to admission. Treated 5/28 per NP at Pulmonary office for AECOPD with no pna on c x r. PFT 4/10 showed FVC= 3.00 (77%), FEV1= 1.37 (45%) ,right lower lobe, 7 mm spiculated lung lesion in the right middle lobe by ct scan 2013. PCCM asked to evaluate. With spiculated mass, hx of thromboembolic disease then evaluation for PE seems reasonable. He has bullous empysema and copd by spirometry and c t scan.  PAST MEDICAL HISTORY :  Past Medical History  Diagnosis Date  . Chronic airway obstruction, not elsewhere classified   . Unspecified essential hypertension   . Cerebrovascular disease, unspecified   . Other and unspecified hyperlipidemia    . Diaphragmatic hernia without mention of obstruction or gangrene   . Diverticulosis of colon (without mention of hemorrhage)   . Benign neoplasm of colon   . Overweight   . On home oxygen therapy   . H/O tobacco use, presenting hazards to health 01/25/2013  . Edema 04/18/2013  . OSA on CPAP   . Peripheral vascular disease     hx of DVT stopped Coumadin 06/05/2013   . Cancer     kidney cancer   . Arthritis   . Polycythemia   . DVT (deep venous thrombosis)     2014   . Pulmonary embolism   . 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 Renal insufficiency 12/23/2013   Past Surgical History  Procedure Laterality Date  . Hernia repair    . Nephroureterectomy    . Blasdder tumor removed     . Transurethral resection of bladder tumor N/A 06/25/2013    Procedure: TRANSURETHRAL RESECTION OF BLADDER TUMOR (TURBT);  Surgeon: Claybon Jabs, MD;  Location: [facility];  Service: Urology;  Laterality: N/A;  . Hydrocele excision Right 06/25/2013    Procedure: HYDROCELECTOMY ADULT;  Surgeon: Claybon Jabs, MD;  Location: [facility];  Service: Urology;  Laterality: Right;   Prior to Admission medications   Medication Sig Start Date End Date Taking? Authorizing Provider  acetaminophen (TYLENOL ARTHRITIS PAIN) 650 MG CR tablet Take 650 mg by mouth every morning.    Yes Historical Provider, MD  ADVAIR DISKUS 250-50 MCG/DOSE AEPB USE ONE  INHALATION TWICE A DAY. 01/09/14  Yes Mosie Lukes, MD  albuterol (PROVENTIL) (2.5 MG/3ML) 0.083% nebulizer solution Take 3 mLs (2.5 mg total) by nebulization every 6 (six) hours as needed for wheezing or shortness of breath. Dx 496 02/11/14  Yes Rigoberto Noel, MD  amoxicillin-clavulanate (AUGMENTIN) 875-125 MG per tablet Take 1 tablet by mouth 2 (two) times daily. 02/21/14 02/28/14 Yes Tammy S Parrett, NP  aspirin 81 MG tablet Take 81 mg by mouth 2 (two) times daily.    Yes Historical Provider, MD  furosemide (LASIX) 40 MG tablet TAKE ONE TABLET DAILY IN THE MORNING 12/17/13  Yes Rigoberto Noel, MD  furosemide (LASIX) 40 MG tablet Take 20-40 mg by mouth 2 (two) times daily. 40 mg in the morning and 20 mg in the evening   Yes Historical Provider, MD  guaiFENesin (MUCINEX) 600 MG 12 hr tablet Take 1,200 mg by mouth 2 (two) times daily.    Yes Historical Provider, MD  losartan (COZAAR) 25 MG tablet Take 12.5 mg by mouth daily.   Yes Historical Provider, MD  Omega-3 Fatty Acids (FISH OIL) 1200 MG CAPS Take 1,200 mg by mouth every morning.   Yes Historical Provider, MD  psyllium (METAMUCIL) 58.6 % packet Take 1 packet by mouth daily.   Yes Historical Provider, MD  simvastatin (ZOCOR) 40 MG tablet TAKE ONE (1) TABLET EACH DAY 12/17/13  Yes Mosie Lukes, MD  SPIRIVA HANDIHALER 18 MCG inhalation capsule INHALE THE CONTENTS OF ONE CAPSULE DAILY 02/11/14  Yes Mosie Lukes, MD   Allergies  Allergen Reactions  . Sulfamethoxazole Other (See Comments)    REACTION: "washes" him out per pt    FAMILY HISTORY:  Family History  Problem Relation Age of Onset  . Cancer Mother     colon with mets  . COPD Father   . Hypertension Father   . Arthritis Sister   . Other Son     h/o cystitis   SOCIAL HISTORY:  reports that he quit smoking about 35 years ago. His smoking use included Cigarettes. He started smoking about 68 years ago. He has a 54 pack-year smoking history. He has never used smokeless tobacco. He reports that he does not drink alcohol or use illicit drugs.  REVIEW OF SYSTEMS:  10 point review of system taken, please see HPI for positives and negatives.   SUBJECTIVE:   VITAL SIGNS: Temp:  [97.7 F (36.5 C)-102.2 F (39 C)] 97.7 F (36.5 C) (06/03 0800) Pulse Rate:  [54-110] 91 (06/03 1000) Resp:  [13-26] 21 (06/03 1000) BP: (101-134)/(41-82) 107/58 mmHg (06/03 1000) SpO2:  [90 %-100 %] 92 % (06/03 1000) Weight:  [91.9 kg (202 lb 9.6 oz)-92.6 kg (204 lb 2.3 oz)] 92.6 kg (204 lb 2.3 oz) (06/03 0400) HEMODYNAMICS:   VENTILATOR SETTINGS:   INTAKE / OUTPUT: Intake/Output      06/02 0701 - 06/03 0700 06/03 0701 - 06/04 0700   P.O.  390   Total Intake(mL/kg)  390 (4.2)   Urine (mL/kg/hr) 200    Total Output 200     Net -200 +390        Stool Occurrence 2 x      PHYSICAL EXAMINATION: General:  Disheveled male in no distress  Neuro:  Dull affect otherwise intact HEENT:  Positive chronic clearing throat, post nasal drip Cardiovascular:  HSR RRR Lungs: Decreased bs, rhonchi, no active wheezes Abdomen:  +bs Musculoskeletal:  Intact. Le non tender Skin:  Warm, + le  edema  LABS:  CBC  Recent Labs Lab 02/26/14 2228 02/27/14 0258  WBC 19.9* 19.2*  HGB 14.0 14.2  HCT 44.1 44.2  PLT 255 222   Coag's No results found for this basename: APTT, INR,  in the last 168 hours BMET  Recent Labs Lab 02/26/14 2228 02/27/14 0258  NA 135* 136*  K 5.4* 5.1  CL 97 98  CO2 28 26  BUN 30* 30*  CREATININE 1.40* 1.45*  GLUCOSE 106* 143*   Electrolytes  Recent Labs Lab 02/26/14 2228 02/27/14 0258  CALCIUM 8.6 8.6   Sepsis Markers  Recent Labs Lab 02/26/14 2254  LATICACIDVEN 1.32   ABG No results found for this basename: PHART, PCO2ART, PO2ART,  in the last 168 hours Liver Enzymes  Recent Labs Lab 02/26/14 2228  AST 34  ALT 30  ALKPHOS 47  BILITOT 0.5  ALBUMIN 2.7*   Cardiac Enzymes  Recent Labs Lab 02/27/14 0258 02/27/14 0835  TROPONINI <0.30 <0.30   Glucose No results found for this basename: GLUCAP,  in the last 168 hours  Imaging Dg Chest Port 1 View  02/26/2014   CLINICAL DATA:  Shortness of breath.  History pulmonary embolism  EXAM: PORTABLE CHEST - 1 VIEW  COMPARISON:  02/21/2014  FINDINGS: Likely no cardiomegaly when accounting for mediastinal fat. Stable upper mediastinal contours, with hilar prominence likely vascular.  Pulmonary hyperinflation and emphysematous change. Scarring present at both bases. There is no evidence of superimposed pneumonia, edema, effusion, or pneumothorax. Note that a small volume of the left  basilar lung is likely excluded from view.  IMPRESSION: Emphysema without superimposed pneumonia or edema.   Electronically Signed   By: Jorje Guild M.D.   On: 02/26/2014 22:46       ASSESSMENT / PLAN:  PULMONARY A:Dyspena     OSA    COPD    Bullous emphysema    ? bronchitis vs PNA     Spiculated mass P:   Rule out recurrent PE, Ct chest ordered noting renal insufficiency LE Dopplers BD O2 as needed Cpap Q hs  CARDIOVASCULAR A: Cor pulmonale      Aortic valve disease      HTN P:  Antihypertensives as needed Negative trop > follow  RENAL Lab Results  Component Value Date   CREATININE 1.45* 02/27/2014   CREATININE 1.40* 02/26/2014   CREATININE 1.46* 10/09/2013   CREATININE 1.37* 06/19/2013   CREATININE 1.23 10/04/2012   CREATININE 1.39* 09/14/2012    A:  Mild renal insuff P:   Will order CT angio chest Follow creatine carefully  GASTROINTESTINAL A:  No acute issue P:     HEMATOLOGIC A:  Hx of PE      Spiculated lung mass P:  Lower ext ds  INFECTIOUS A:  Recent Tx a outpatient with Augmentin  P:   Currently on tx for cap See flows  ENDOCRINE A:  No acute issue   P:     NEUROLOGIC A:  Hx of Luncar strokes in the past.  P:   RASS goal: 0   TODAY'S SUMMARY:  80 yo male who is followed by Dr. Elsworth Soho for OSA/COPD/spiculated lung mass/hx of PE and reports breathing difficulties x 3 weeks without fever . Chills, sweats or purulent sputum production. He notes sudden increase in dyspnea just prior to admission. Treated 5/28 per NP at Pulmonary office for AECOPD with no pna on c x r. PFT 4/10 showed FVC= 3.00 (77%), FEV1= 1.37 (45%) ,right lower lobe,  7 mm spiculated lung lesion in the right middle lobe by ct scan 2013. PCCM asked to evaluate. With spiculated mass, hx of thromboembolic disease then evaluation for PE seems reasonable. He has bullous empysema and copd by spirometry and c t scan.    I have personally obtained a history, examined the patient,  evaluated laboratory and imaging results, formulated the assessment and plan and placed orders.  CRITICAL CARE: The patient is critically ill with multiple organ systems failure and requires high complexity decision making for assessment and support, frequent evaluation and titration of therapies, application of advanced monitoring technologies and extensive interpretation of multiple databases. Critical Care Time devoted to patient care services described in this note is 50 minutes.    Pulmonary and [place] Pager: [phone] 02/27/2014, 10:36 AM  Attending Note:   I agree with S Minor's note above. The Ct-PA does not show a new PE, but it does show L>RLL infiltrates consistent with PNA. Agree with treating as CAP with AE-COPD.   Baltazar Apo, MD, PhD 02/27/2014, 1:00 PM [facility] [phone] or if no answer [phone]

## 2014-02-27 NOTE — Progress Notes (Signed)
Transported patient with RN to ICU. Placed on nonrebreather mask for the transport. O2 sats mid to high 90's throughout the trip. No distress.

## 2014-02-27 NOTE — Progress Notes (Signed)
Clinical Social Work Department BRIEF PSYCHOSOCIAL ASSESSMENT 02/27/2014  Patient:  Tracy [name], Tracy [name]     Account Number:  [id number]     Admit date:  02/26/2014  Clinical Social Worker:  Ulyess Blossom  Date/Time:  02/27/2014 03:30 PM  Referred by:  CSW  Date Referred:  02/27/2014 Referred for  Other - See comment   Other Referral:   CSW reviewed chart and noted that pt admitted from [place] type:  Patient Other interview type:   and patient daughter and patient wife at bedside    PSYCHOSOCIAL DATA Living Status:  FACILITY Admitted from facility:  [facility] Level of care:  Independent Living Primary support name:  Tracy [name]/spouse/[phone] Primary support relationship to patient:  SPOUSE Degree of support available:   strong    CURRENT CONCERNS Current Concerns  Post-Acute Placement   Other Concerns:    SOCIAL WORK ASSESSMENT / PLAN CSW reviewed chart and noted that pt admitted from [facility].    CSW met with pt and pt family members at bedside. CSW introduced self and explained role. Pt stated that he is a resident at [facility]. Pt is hopeful to return to [place], but states that he may need to go to rehab at [facility]. CSW discussed that pt will be assessed in the hospital if pt needs higher level of care. Pt expressed understanding and appreciative of CSW following for potential needs.    CSW to continue to follow.   Assessment/plan status:  Psychosocial Support/Ongoing Assessment of Needs Other assessment/ plan:   Information/referral to community resources:   Referral to [facility] to notify that pt is independent living level of care, will continue to assess for potential needs.    PATIENT'S/FAMILY'S RESPONSE TO PLAN OF CARE: Pt alert and oriented x 4. Pt pleasant and humorous during discussion. Pt hopeful to return to independent living level at [facility], but aware that needs will  continued to be assessed. Pt supportive and actively involved.    Tracy [name], MSW, [place] Work [phone]

## 2014-02-27 NOTE — Progress Notes (Signed)
[id number]/Sabeen Piechocki, RN, BSN, CCM  [phone]  Chart Reviewed for discharge and hospital needs.  Discharge needs at time of review: None present will follow for needs.  Review of patient progress due on [id number]. 

## 2014-02-28 DIAGNOSIS — I359 Nonrheumatic aortic valve disorder, unspecified: Secondary | ICD-10-CM

## 2014-02-28 DIAGNOSIS — R0989 Other specified symptoms and signs involving the circulatory and respiratory systems: Secondary | ICD-10-CM

## 2014-02-28 DIAGNOSIS — I4719 Other supraventricular tachycardia: Secondary | ICD-10-CM | POA: Diagnosis present

## 2014-02-28 DIAGNOSIS — I471 Supraventricular tachycardia: Secondary | ICD-10-CM | POA: Diagnosis present

## 2014-02-28 DIAGNOSIS — R0602 Shortness of breath: Secondary | ICD-10-CM

## 2014-02-28 DIAGNOSIS — I498 Other specified cardiac arrhythmias: Secondary | ICD-10-CM

## 2014-02-28 DIAGNOSIS — R0600 Dyspnea, unspecified: Secondary | ICD-10-CM | POA: Diagnosis present

## 2014-02-28 DIAGNOSIS — R0609 Other forms of dyspnea: Secondary | ICD-10-CM | POA: Diagnosis present

## 2014-02-28 DIAGNOSIS — R609 Edema, unspecified: Secondary | ICD-10-CM

## 2014-02-28 DIAGNOSIS — I10 Essential (primary) hypertension: Secondary | ICD-10-CM

## 2014-02-28 DIAGNOSIS — I2609 Other pulmonary embolism with acute cor pulmonale: Secondary | ICD-10-CM

## 2014-02-28 DIAGNOSIS — I499 Cardiac arrhythmia, unspecified: Secondary | ICD-10-CM

## 2014-02-28 LAB — CBC
HCT: 39 % (ref 39.0–52.0)
Hemoglobin: 12.4 g/dL — ABNORMAL LOW (ref 13.0–17.0)
MCH: 27.5 pg (ref 26.0–34.0)
MCHC: 31.8 g/dL (ref 30.0–36.0)
MCV: 86.5 fL (ref 78.0–100.0)
PLATELETS: 221 10*3/uL (ref 150–400)
RBC: 4.51 MIL/uL (ref 4.22–5.81)
RDW: 15.9 % — AB (ref 11.5–15.5)
WBC: 19.3 10*3/uL — ABNORMAL HIGH (ref 4.0–10.5)

## 2014-02-28 LAB — URINE CULTURE
CULTURE: NO GROWTH
Colony Count: NO GROWTH

## 2014-02-28 LAB — BASIC METABOLIC PANEL
BUN: 35 mg/dL — ABNORMAL HIGH (ref 6–23)
CHLORIDE: 99 meq/L (ref 96–112)
CO2: 28 mEq/L (ref 19–32)
CREATININE: 1.49 mg/dL — AB (ref 0.50–1.35)
Calcium: 8.6 mg/dL (ref 8.4–10.5)
GFR calc non Af Amer: 41 mL/min — ABNORMAL LOW (ref >90)
GFR, EST AFRICAN AMERICAN: 48 mL/min — AB (ref >90)
Glucose, Bld: 138 mg/dL — ABNORMAL HIGH (ref 70–99)
POTASSIUM: 4.8 meq/L (ref 3.7–5.3)
Sodium: 137 mEq/L (ref 137–147)

## 2014-02-28 MED ORDER — METOPROLOL TARTRATE 12.5 MG HALF TABLET
12.5000 mg | ORAL_TABLET | Freq: Two times a day (BID) | ORAL | Status: DC
  Administered 2014-02-28: 12.5 mg via ORAL
  Filled 2014-02-28 (×3): qty 1

## 2014-02-28 MED ORDER — METOPROLOL TARTRATE 1 MG/ML IV SOLN
2.5000 mg | Freq: Once | INTRAVENOUS | Status: AC
  Administered 2014-02-28: 2.5 mg via INTRAVENOUS
  Filled 2014-02-28: qty 5

## 2014-02-28 MED ORDER — METHYLPREDNISOLONE SODIUM SUCC 40 MG IJ SOLR
40.0000 mg | Freq: Two times a day (BID) | INTRAMUSCULAR | Status: DC
  Administered 2014-02-28 – 2014-03-01 (×2): 40 mg via INTRAVENOUS
  Filled 2014-02-28 (×4): qty 1

## 2014-02-28 NOTE — Progress Notes (Signed)
VASCULAR LAB PRELIMINARY  PRELIMINARY  PRELIMINARY  PRELIMINARY  Bilateral lower extremity venous duplex completed.    Preliminary report:  Bilateral:  No evidence of DVT, superficial thrombosis, or Baker's Cyst.   Vilma Prader [place], RVS 02/28/2014, 9:08 AM

## 2014-02-28 NOTE — Consult Note (Signed)
Reason for Consult: Tachycardia  Requesting Physician: Dr Elsworth Soho  HPI: This is a 80 y.o. male with a past medical history significant for severe COPD with secondary polycythemia requiring periodic phlebotomy.  He was seen by Dr Stanford Breed in Feb 2015. He has no history of MI, CAD, or prior arrythmia. He had a PE/DVT in the past and was on Coumadin- stopped Sept 2014. Echo in May 2014 showed an EF of 60-65% with mild AS (Pk grad 13), and PA pressure of 55 mmHg. He was admitted 02/26/14 with increasing DOA. CTA was negative for PE. Today he had tachycardia. At time it appears to be AF with RVR but currently he appears to be in MAT. He is tolerating this well. He is unaware of tachycardia.   PMHx:  Past Medical History  Diagnosis Date  . Chronic airway obstruction, not elsewhere classified   . Unspecified essential hypertension   . Cerebrovascular disease, unspecified   . Other and unspecified hyperlipidemia   . Diaphragmatic hernia without mention of obstruction or gangrene   . Diverticulosis of colon (without mention of hemorrhage)   . Benign neoplasm of colon   . Overweight   . On home oxygen therapy   . H/O tobacco use, presenting hazards to health 01/25/2013  . Edema 04/18/2013  . OSA on CPAP   . Peripheral vascular disease     hx of DVT stopped Coumadin 06/05/2013   . Cancer     kidney cancer   . Arthritis   . Polycythemia   . DVT (deep venous thrombosis)     2014   . Pulmonary embolism   . 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 Renal insufficiency 12/23/2013   Past Surgical History  Procedure Laterality Date  . Hernia repair    . Nephroureterectomy    . Blasdder tumor removed     . Transurethral resection of bladder tumor N/A 06/25/2013    Procedure: TRANSURETHRAL RESECTION OF BLADDER TUMOR (TURBT);  Surgeon: Claybon Jabs, MD;  Location: [facility];  Service: Urology;  Laterality: N/A;  . Hydrocele excision Right 06/25/2013    Procedure: HYDROCELECTOMY ADULT;  Surgeon: Claybon Jabs, MD;   Location: [facility];  Service: Urology;  Laterality: Right;    FAMHx: remarkable  for cancer and HTN   SOCHx:  reports that he quit smoking about 35 years ago. His smoking use included Cigarettes. He started smoking about 68 years ago. He has a 54 pack-year smoking history. He has never used smokeless tobacco. He reports that he does not drink alcohol or use illicit drugs.  ALLERGIES: Allergies  Allergen Reactions  . Sulfamethoxazole Other (See Comments)    REACTION: "washes" him out per pt    ROS: Pertinent items are noted in HPI. see H&P for complete ROS  HOME MEDICATIONS: Prescriptions prior to admission  Medication Sig Dispense Refill  . acetaminophen (TYLENOL ARTHRITIS PAIN) 650 MG CR tablet Take 650 mg by mouth every morning.       Marland Kitchen ADVAIR DISKUS 250-50 MCG/DOSE AEPB USE ONE INHALATION TWICE A DAY.  60 each  3  . albuterol (PROVENTIL) (2.5 MG/3ML) 0.083% nebulizer solution Take 3 mLs (2.5 mg total) by nebulization every 6 (six) hours as needed for wheezing or shortness of breath. Dx 496  360 mL  12  . amoxicillin-clavulanate (AUGMENTIN) 875-125 MG per tablet Take 1 tablet by mouth 2 (two) times daily.  14 tablet  0  . aspirin 81 MG tablet Take 81 mg by mouth 2 (two) times  daily.       . furosemide (LASIX) 40 MG tablet TAKE ONE TABLET DAILY IN THE MORNING      . furosemide (LASIX) 40 MG tablet Take 20-40 mg by mouth 2 (two) times daily. 40 mg in the morning and 20 mg in the evening      . guaiFENesin (MUCINEX) 600 MG 12 hr tablet Take 1,200 mg by mouth 2 (two) times daily.       Marland Kitchen losartan (COZAAR) 25 MG tablet Take 12.5 mg by mouth daily.      . Omega-3 Fatty Acids (FISH OIL) 1200 MG CAPS Take 1,200 mg by mouth every morning.      . psyllium (METAMUCIL) 58.6 % packet Take 1 packet by mouth daily.      . simvastatin (ZOCOR) 40 MG tablet TAKE ONE (1) TABLET EACH DAY  30 tablet  3  . SPIRIVA HANDIHALER 18 MCG inhalation capsule INHALE THE CONTENTS OF ONE CAPSULE DAILY  30 capsule   3    HOSPITAL MEDICATIONS: I have reviewed the patient's current medications.  VITALS: Blood pressure 115/84, pulse 117, temperature 98.2 F (36.8 C), temperature source Oral, resp. rate 15, height 5' 10"  (1.778 m), weight 205 lb 7.5 oz (93.2 kg), SpO2 99.00%.  PHYSICAL EXAM: General appearance: alert, cooperative, no distress and moderately obese Neck: no carotid bruit and no JVD Lungs: decreased breath sounds, no wheezing Heart: regular rate and rhythm and increased rate Abdomen: obese Extremities: no edema Pulses: 2+ and symmetric Skin: pale, cool, dry Neurologic: Grossly normal  LABS: Results for orders placed during the hospital encounter of 02/26/14 (from the past 48 hour(s))  BLOOD GAS, VENOUS     Status: Abnormal   Collection Time    02/26/14 10:15 PM      Result Value Ref Range   O2 Content 10.0     Delivery systems BILEVEL POSITIVE AIRWAY PRESSURE     Mode  AUTO TITRATION IMAX 20 EMIN 8     pH, Ven 7.362 (*) 7.250 - 7.300   pCO2, Ven 53.8 (*) 45.0 - 50.0 mmHg   pO2, Ven  VALUE BELOW REPORTABLE RANGE. RBV  30.0 - 45.0 mmHg   Comment:  COURTNEY HOR      VALUE BELOW REPORTABLE RANGE. RBV      Thayer Jew, MD AT [facility], RRT, RCP ON 02/26/14.   Bicarbonate 29.2 (*) 20.0 - 24.0 mEq/L   TCO2 26.2  0 - 100 mmol/L   Acid-Base Excess 3.5 (*) 0.0 - 2.0 mmol/L   O2 Saturation 35.0     Patient temperature 101.0     Collection site VEIN     Drawn by [phone]     Sample type VEIN    CULTURE, BLOOD (ROUTINE X 2)     Status: None   Collection Time    02/26/14 10:28 PM      Result Value Ref Range   Specimen Description BLOOD LEFT ANTECUBITAL     Special Requests BOTTLES DRAWN AEROBIC AND ANAEROBIC 5CC     Culture  Setup Time       Value: 02/27/2014 01:08     Performed at [facility]   Culture       Value:        BLOOD CULTURE RECEIVED NO GROWTH TO DATE CULTURE WILL BE HELD FOR 5 DAYS BEFORE ISSUING A FINAL NEGATIVE REPORT     Performed at [facility]   Report Status PENDING  CBC WITH DIFFERENTIAL     Status: Abnormal   Collection Time    02/26/14 10:28 PM      Result Value Ref Range   WBC 19.9 (*) 4.0 - 10.5 K/uL   RBC 5.03  4.22 - 5.81 MIL/uL   Hemoglobin 14.0  13.0 - 17.0 g/dL   HCT 44.1  39.0 - 52.0 %   MCV 87.7  78.0 - 100.0 fL   MCH 27.8  26.0 - 34.0 pg   MCHC 31.7  30.0 - 36.0 g/dL   RDW 16.1 (*) 11.5 - 15.5 %   Platelets 255  150 - 400 K/uL   Neutrophils Relative % 86 (*) 43 - 77 %   Neutro Abs 17.1 (*) 1.7 - 7.7 K/uL   Lymphocytes Relative 5 (*) 12 - 46 %   Lymphs Abs 1.0  0.7 - 4.0 K/uL   Monocytes Relative 7  3 - 12 %   Monocytes Absolute 1.4 (*) 0.1 - 1.0 K/uL   Eosinophils Relative 2  0 - 5 %   Eosinophils Absolute 0.4  0.0 - 0.7 K/uL   Basophils Relative 0  0 - 1 %   Basophils Absolute 0.1  0.0 - 0.1 K/uL  COMPREHENSIVE METABOLIC PANEL     Status: Abnormal   Collection Time    02/26/14 10:28 PM      Result Value Ref Range   Sodium 135 (*) 137 - 147 mEq/L   Potassium 5.4 (*) 3.7 - 5.3 mEq/L   Comment: MODERATE HEMOLYSIS     HEMOLYSIS AT THIS LEVEL MAY AFFECT RESULT   Chloride 97  96 - 112 mEq/L   CO2 28  19 - 32 mEq/L   Glucose, Bld 106 (*) 70 - 99 mg/dL   BUN 30 (*) 6 - 23 mg/dL   Creatinine, Ser 1.40 (*) 0.50 - 1.35 mg/dL   Calcium 8.6  8.4 - 10.5 mg/dL   Total Protein 6.7  6.0 - 8.3 g/dL   Albumin 2.7 (*) 3.5 - 5.2 g/dL   AST 34  0 - 37 U/L   Comment: MODERATE HEMOLYSIS     HEMOLYSIS AT THIS LEVEL MAY AFFECT RESULT   ALT 30  0 - 53 U/L   Comment: MODERATE HEMOLYSIS     HEMOLYSIS AT THIS LEVEL MAY AFFECT RESULT   Alkaline Phosphatase 47  39 - 117 U/L   Total Bilirubin 0.5  0.3 - 1.2 mg/dL   GFR calc non Af Amer 44 (*) >90 mL/min   GFR calc Af Amer 51 (*) >90 mL/min   Comment: (NOTE)     The eGFR has been calculated using the CKD EPI equation.     This calculation has not been validated in all clinical situations.     eGFR's persistently <90 mL/min signify possible Chronic Kidney      Disease.  URINALYSIS, ROUTINE W REFLEX MICROSCOPIC     Status: Abnormal   Collection Time    02/26/14 10:39 PM      Result Value Ref Range   Color, Urine YELLOW  YELLOW   APPearance CLEAR  CLEAR   Specific Gravity, Urine 1.018  1.005 - 1.030   pH 6.0  5.0 - 8.0   Glucose, UA NEGATIVE  NEGATIVE mg/dL   Hgb urine dipstick NEGATIVE  NEGATIVE   Bilirubin Urine NEGATIVE  NEGATIVE   Ketones, ur NEGATIVE  NEGATIVE mg/dL   Protein, ur 30 (*) NEGATIVE mg/dL   Urobilinogen, UA 1.0  0.0 - 1.0 mg/dL  Nitrite NEGATIVE  NEGATIVE   Leukocytes, UA NEGATIVE  NEGATIVE  URINE CULTURE     Status: None   Collection Time    02/26/14 10:39 PM      Result Value Ref Range   Specimen Description URINE, RANDOM     Special Requests NONE     Culture  Setup Time       Value: 02/27/2014 02:02     Performed at [facility]       Value: NO GROWTH     Performed at [facility]   Culture       Value: NO GROWTH     Performed at [facility]   Report Status 02/28/2014 FINAL    URINE MICROSCOPIC-ADD ON     Status: None   Collection Time    02/26/14 10:39 PM      Result Value Ref Range   Squamous Epithelial / LPF RARE  RARE  LEGIONELLA ANTIGEN, URINE     Status: None   Collection Time    02/26/14 10:39 PM      Result Value Ref Range   Specimen Description URINE, CLEAN CATCH     Special Requests NONE     Legionella Antigen, Urine       Value: Negative for Legionella pneumophilia serogroup 1     Performed at [facility]   Report Status 02/27/2014 FINAL    STREP PNEUMONIAE URINARY ANTIGEN     Status: None   Collection Time    02/26/14 10:39 PM      Result Value Ref Range   Strep Pneumo Urinary Antigen NEGATIVE  NEGATIVE   Comment:            Infection due to S. pneumoniae     cannot be absolutely ruled out     since the antigen present     may be below the detection limit     of the test.     Performed at [facility], BLOOD  (ROUTINE X 2)     Status: None   Collection Time    02/26/14 10:45 PM      Result Value Ref Range   Specimen Description BLOOD RIGHT ANTECUBITAL     Special Requests BOTTLES DRAWN AEROBIC AND ANAEROBIC 5CC     Culture  Setup Time       Value: 02/27/2014 01:08     Performed at [facility]   Culture       Value:        BLOOD CULTURE RECEIVED NO GROWTH TO DATE CULTURE WILL BE HELD FOR 5 DAYS BEFORE ISSUING A FINAL NEGATIVE REPORT     Performed at [facility]   Report Status PENDING    I-STAT CG4 LACTIC ACID, ED     Status: None   Collection Time    02/26/14 10:54 PM      Result Value Ref Range   Lactic Acid, Venous 1.32  0.5 - 2.2 mmol/L  MRSA PCR SCREENING     Status: None   Collection Time    02/27/14  1:25 AM      Result Value Ref Range   MRSA by PCR NEGATIVE  NEGATIVE   Comment:            The GeneXpert MRSA Assay (FDA     approved for NASAL specimens     only), is one component of a     comprehensive  MRSA colonization     surveillance program. It is not     intended to diagnose MRSA     infection nor to guide or     monitor treatment for     MRSA infections.  TROPONIN I     Status: None   Collection Time    02/27/14  2:58 AM      Result Value Ref Range   Troponin I <0.30  <0.30 ng/mL   Comment:            Due to the release kinetics of cTnI,     a negative result within the first hours     of the onset of symptoms does not rule out     myocardial infarction with certainty.     If myocardial infarction is still suspected,     repeat the test at appropriate intervals.  D-DIMER, QUANTITATIVE     Status: Abnormal   Collection Time    02/27/14  2:58 AM      Result Value Ref Range   D-Dimer, Quant 1.62 (*) 0.00 - 0.48 ug/mL-FEU   Comment:            AT THE INHOUSE ESTABLISHED CUTOFF     VALUE OF 0.48 ug/mL FEU,     THIS ASSAY HAS BEEN DOCUMENTED     IN THE LITERATURE TO HAVE     A SENSITIVITY AND NEGATIVE     PREDICTIVE VALUE OF AT LEAST     98  TO 99%.  THE TEST RESULT     SHOULD BE CORRELATED WITH     AN ASSESSMENT OF THE CLINICAL     PROBABILITY OF DVT / VTE.  TSH     Status: None   Collection Time    02/27/14  2:58 AM      Result Value Ref Range   TSH 1.240  0.350 - 4.500 uIU/mL   Comment: Performed at [facility] PANEL     Status: Abnormal   Collection Time    02/27/14  2:58 AM      Result Value Ref Range   Sodium 136 (*) 137 - 147 mEq/L   Potassium 5.1  3.7 - 5.3 mEq/L   Chloride 98  96 - 112 mEq/L   CO2 26  19 - 32 mEq/L   Glucose, Bld 143 (*) 70 - 99 mg/dL   BUN 30 (*) 6 - 23 mg/dL   Creatinine, Ser 1.45 (*) 0.50 - 1.35 mg/dL   Calcium 8.6  8.4 - 10.5 mg/dL   GFR calc non Af Amer 42 (*) >90 mL/min   GFR calc Af Amer 49 (*) >90 mL/min   Comment: (NOTE)     The eGFR has been calculated using the CKD EPI equation.     This calculation has not been validated in all clinical situations.     eGFR's persistently <90 mL/min signify possible Chronic Kidney     Disease.  CBC     Status: Abnormal   Collection Time    02/27/14  2:58 AM      Result Value Ref Range   WBC 19.2 (*) 4.0 - 10.5 K/uL   RBC 5.09  4.22 - 5.81 MIL/uL   Hemoglobin 14.2  13.0 - 17.0 g/dL   HCT 44.2  39.0 - 52.0 %   MCV 86.8  78.0 - 100.0 fL   MCH 27.9  26.0 - 34.0 pg   MCHC 32.1  30.0 - 36.0  g/dL   RDW 15.9 (*) 11.5 - 15.5 %   Platelets 222  150 - 400 K/uL  INFLUENZA PANEL BY PCR (TYPE A & B, H1N1)     Status: None   Collection Time    02/27/14  6:31 AM      Result Value Ref Range   Influenza A By PCR NEGATIVE  NEGATIVE   Influenza B By PCR NEGATIVE  NEGATIVE   H1N1 flu by pcr NOT DETECTED  NOT DETECTED   Comment:            The Xpert Flu assay (FDA approved for     nasal aspirates or washes and     nasopharyngeal swab specimens), is     intended as an aid in the diagnosis of     influenza and should not be used as     a sole basis for treatment.     Performed at [facility]  TROPONIN I     Status:  None   Collection Time    02/27/14  8:35 AM      Result Value Ref Range   Troponin I <0.30  <0.30 ng/mL   Comment:            Due to the release kinetics of cTnI,     a negative result within the first hours     of the onset of symptoms does not rule out     myocardial infarction with certainty.     If myocardial infarction is still suspected,     repeat the test at appropriate intervals.  TROPONIN I     Status: None   Collection Time    02/27/14  3:23 PM      Result Value Ref Range   Troponin I <0.30  <0.30 ng/mL   Comment:            Due to the release kinetics of cTnI,     a negative result within the first hours     of the onset of symptoms does not rule out     myocardial infarction with certainty.     If myocardial infarction is still suspected,     repeat the test at appropriate intervals.  CBC     Status: Abnormal   Collection Time    02/28/14  3:10 AM      Result Value Ref Range   WBC 19.3 (*) 4.0 - 10.5 K/uL   RBC 4.51  4.22 - 5.81 MIL/uL   Hemoglobin 12.4 (*) 13.0 - 17.0 g/dL   HCT 39.0  39.0 - 52.0 %   MCV 86.5  78.0 - 100.0 fL   MCH 27.5  26.0 - 34.0 pg   MCHC 31.8  30.0 - 36.0 g/dL   RDW 15.9 (*) 11.5 - 15.5 %   Platelets 221  150 - 400 K/uL  BASIC METABOLIC PANEL     Status: Abnormal   Collection Time    02/28/14  3:10 AM      Result Value Ref Range   Sodium 137  137 - 147 mEq/L   Potassium 4.8  3.7 - 5.3 mEq/L   Chloride 99  96 - 112 mEq/L   CO2 28  19 - 32 mEq/L   Glucose, Bld 138 (*) 70 - 99 mg/dL   BUN 35 (*) 6 - 23 mg/dL   Creatinine, Ser 1.49 (*) 0.50 - 1.35 mg/dL   Calcium 8.6  8.4 - 10.5 mg/dL   GFR  calc non Af Amer 41 (*) >90 mL/min   GFR calc Af Amer 48 (*) >90 mL/min   Comment: (NOTE)     The eGFR has been calculated using the CKD EPI equation.     This calculation has not been validated in all clinical situations.     eGFR's persistently <90 mL/min signify possible Chronic Kidney     Disease.    EKG: 02/26/14- NSR  IMAGING: Ct  Angio Chest Pe W/cm &/or Wo Cm  02/27/2014   CLINICAL DATA:  Difficulty breathing ; history of bladder carcinoma  EXAM: CT ANGIOGRAPHY CHEST WITH CONTRAST    IMPRESSION: No demonstrable pulmonary embolus. Extensive bullous emphysema. There is airspace consolidation in both lower lobes, more on the left than on the right. 1   Electronically Signed   By: Lowella Grip M.D.   On: 02/27/2014 12:12   Dg Chest Port 1 View  02/26/2014   CLINICAL DATA:  Shortness of breath.  History pulmonary embolism  EXAM: PORTABLE CHEST -   IMPRESSION: Emphysema without superimposed pneumonia or edema.   Electronically Signed   By: Jorje Guild M.D.   On: 02/26/2014 22:46    IMPRESSION: Principal Problem:   Dyspnea on exertion Active Problems:   Acute cor pulmonale   Multifocal atrial tachycardia   OBSTRUCTIVE SLEEP APNEA   COPD- severe   Polycythemia- secondary to COPD   HYPERLIPIDEMIA   HYPERTENSION, BORDERLINE   PE/DVT 2014- now off Coumadin, CTA negative   RECOMMENDATION: Start low dose beta blocker and add Diltiazem if needed.  Echo being done now but pt's HR is 120 so not sure how helpful this will be. Consider full dose anticoagulation. MD to see.  Time Spent Directly with Patient: 45 minutes  Erlene Quan [phone] beeper 02/28/2014, 4:17 PM    I have seen and evaluated the patient this PM along with Kerin Ransom, P. I agree with his findings, examination as well as impression recommendations.  Pleasant elderly man with significant COPD & no prior h/o CAD or cardia arrhythmia - admitted for increased dyspnea. Was apparenly improving with plan to transfer out of SDU & developed and irregular RVR rhythm that on tele is hard to determine MAT vs. Afib. - I agree with initial attempts @ rate control.  I have ordered a 1 x IV Metoprolol bolus.   - ordered EKG now & for in AM for better diagnostic testing. -- MAT would go along with worsening pulmonary function; If BB not effective would change  over to Diltiazem.   -- for now, would hold off on Anticoagulation until we know a true rhythm Dx - MAT is not usually an indication for anticoagulation.  We will continue to follow along & be available for assistance.  Echo pending (agree that with RVR - may not be very helpful).  Thankfully, he is relatively asymptomatic.   Leonie Man, M.D., M.S. Interventional Cardiologist   Pager # [phone] 02/28/2014

## 2014-02-28 NOTE — Progress Notes (Addendum)
Patient ID: Tracy [name]  male  Tracy [name]    DOB: 06-08-1926    DOA: 02/26/2014  PCP: Tracy Homans, MD  Addendum  Notified by RN about tachycardia, EKG obtained, atrial fibrillation, I do not see any prior diagnosis of atrial fibrillation on his chart review. Patient has been in and out of atrial fibrillation on telemetry. No chest pain, dizziness or any palpitations. Called cardiology consult, prior echo in 01/2013 showed EF of 60-65%, mild aortic stenosis, moderate pulmonary hypertension We'll await cardiology recommendations prior to transferring patient out of SDU   Tracy [name] M.D. [facility] 02/28/2014, 11:21 AM  Pager: [phone]     Assessment/Plan: Principal Problem:   Acute on chronic respiratory failure secondary to COPD exacerbation, CAP, OSA, bilateral pneumonia/consolidation - Off BiPAP, influenza negative, urine strep antigen negative - Continue IV Zithromax, Rocephin, nebulizer scheduled, Tessalon Perles, Robitussin as needed -  Taper IV supplemental, start on oral prednisone in a.m. - Appreciate pulmonology recommendations - Desats easily with minimal exertion, at baseline on 8 L O2 via nasal cannula  Active Problems:   OBSTRUCTIVE SLEEP APNEA  - continue CPAP/BiPAP per respiratory therapist    CAP (community acquired pneumonia) - As #1   History of PE - D-dimer elevated, CT angiogram of the chest negative for any PE but showed airspace consolidation in both lower lobes more on the left - Doppler ultrasound of the lower extremities negative for DVT  Hypertension: -Hold ARB for now, BP stable    COPD (chronic obstructive pulmonary disease) - As #1   Spiculated mass: Not seen on most recent CT chest done yesterday, pulmonology following   Petechiae on his lower abdomen: - Patient incidentally saw it last week during shower, denies any pain in the area, states happened because of coughing. Patient also reports that similar petechia/hematoma  previously which resolved spontaneously. Platelets normal. - Hold off on Lovenox for now  DVT Prophylaxis: Lovenox  PCT today and placed on SCDs   Code Status: Full code   Family Communication: patient alert and oriented x4, discussed with the patient   Disposition: Transfer to telemetry today  Consultants:  pulmonology   Procedures:  None   Antibiotics:  IV Rocephin    IV Zithromax   Subjective:  patient seen and examined, currently having no acute complaints, eating breakfast, overnight desat and also with exertion. Patient reports that this is his baseline and at home, and he desats, he sits down until his O2 sats improve   Objective: Weight change: 1.3 kg (2 lb 13.9 oz)  Intake/Output Summary (Last 24 hours) at 02/28/14 1042 Last data filed at 02/28/14 0933  Gross per 24 hour  Intake   1370 ml  Output   1575 ml  Net   -205 ml   Blood pressure 102/61, pulse 105, temperature 99.1 F (37.3 C), temperature source Axillary, resp. rate 15, height 5\' 10"  (1.778 m), weight 93.2 kg (205 lb 7.5 oz), SpO2 92.00%.  Physical Exam: General: Alert and awake, oriented x3, not in any acute distress. CVS: S1-S2 clear, no murmur rubs or gallops Chest:  Decreased lung sounds at the bases Abdomen: soft nontender, nondistended, normal bowel sounds  Extremities: no cyanosis, clubbing or edema noted bilaterally   Lab Results: Basic Metabolic Panel:  Recent Labs Lab 02/27/14 0258 02/28/14 0310  NA 136* 137  K 5.1 4.8  CL 98 99  CO2 26 28  GLUCOSE 143* 138*  BUN 30* 35*  CREATININE 1.45* 1.49*  CALCIUM  8.6 8.6   Liver Function Tests:  Recent Labs Lab 02/26/14 2228  AST 34  ALT 30  ALKPHOS 47  BILITOT 0.5  PROT 6.7  ALBUMIN 2.7*   No results found for this basename: LIPASE, AMYLASE,  in the last 168 hours No results found for this basename: AMMONIA,  in the last 168 hours CBC:  Recent Labs Lab 02/26/14 2228 02/27/14 0258 02/28/14 0310  WBC 19.9* 19.2*  19.3*  NEUTROABS 17.1*  --   --   HGB 14.0 14.2 12.4*  HCT 44.1 44.2 39.0  MCV 87.7 86.8 86.5  PLT 255 222 221   Cardiac Enzymes:  Recent Labs Lab 02/27/14 0258 02/27/14 0835 02/27/14 1523  TROPONINI <0.30 <0.30 <0.30   BNP: No components found with this basename: POCBNP,  CBG: No results found for this basename: GLUCAP,  in the last 168 hours   Micro Results: Recent Results (from the past 240 hour(s))  CULTURE, BLOOD (ROUTINE X 2)     Status: None   Collection Time    02/26/14 10:28 PM      Result Value Ref Range Status   Specimen Description BLOOD LEFT ANTECUBITAL   Final   Special Requests BOTTLES DRAWN AEROBIC AND ANAEROBIC 5CC   Final   Culture  Setup Time     Final   Value: 02/27/2014 01:08     Performed at [facility]   Culture     Final   Value:        BLOOD CULTURE RECEIVED NO GROWTH TO DATE CULTURE WILL BE HELD FOR 5 DAYS BEFORE ISSUING A FINAL NEGATIVE REPORT     Performed at [facility]   Report Status PENDING   Incomplete  URINE CULTURE     Status: None   Collection Time    02/26/14 10:39 PM      Result Value Ref Range Status   Specimen Description URINE, RANDOM   Final   Special Requests NONE   Final   Culture  Setup Time     Final   Value: 02/27/2014 02:02     Performed at [place]     Final   Value: NO GROWTH     Performed at [facility]   Culture     Final   Value: NO GROWTH     Performed at [facility]   Report Status 02/28/2014 FINAL   Final  CULTURE, BLOOD (ROUTINE X 2)     Status: None   Collection Time    02/26/14 10:45 PM      Result Value Ref Range Status   Specimen Description BLOOD RIGHT ANTECUBITAL   Final   Special Requests BOTTLES DRAWN AEROBIC AND ANAEROBIC 5CC   Final   Culture  Setup Time     Final   Value: 02/27/2014 01:08     Performed at [facility]   Culture     Final   Value:        BLOOD CULTURE RECEIVED NO GROWTH TO DATE CULTURE WILL BE HELD FOR  5 DAYS BEFORE ISSUING A FINAL NEGATIVE REPORT     Performed at [facility]   Report Status PENDING   Incomplete  MRSA PCR SCREENING     Status: None   Collection Time    02/27/14  1:25 AM      Result Value Ref Range Status   MRSA by PCR NEGATIVE  NEGATIVE Final   Comment:  The GeneXpert MRSA Assay (FDA     approved for NASAL specimens     only), is one component of a     comprehensive MRSA colonization     surveillance program. It is not     intended to diagnose MRSA     infection nor to guide or     monitor treatment for     MRSA infections.    Studies/Results: Dg Chest 2 View  02/21/2014   CLINICAL DATA:  Cough, former smoking history, COPD  EXAM: CHEST  2 VIEW  COMPARISON:  Chest x-ray of 10/11/2013  FINDINGS: The lungs are hyperaerated with somewhat flattened hemidiaphragms suggesting emphysema. There are prominent markings primarily at the lung bases the majority of which appear chronic. There are somewhat more prominent markings on the lateral view in the lung base posteriorly and superimposed pneumonia would be difficult to exclude. Followup chest x-ray is recommended if warranted clinically. No effusion is seen. The heart is mildly enlarged and stable. There are degenerative changes throughout the thoracic spine.  IMPRESSION: 1. Emphysema and probable basilar fibrosis. Somewhat more prominent markings on the lateral view at the lung base could represent superimposed pneumonia and followup is recommended if warranted clinically. 2. Stable cardiomegaly.   Electronically Signed   By: Ivar Drape M.D.   On: 02/21/2014 10:17   Dg Chest Port 1 View  02/26/2014   CLINICAL DATA:  Shortness of breath.  History pulmonary embolism  EXAM: PORTABLE CHEST - 1 VIEW  COMPARISON:  02/21/2014  FINDINGS: Likely no cardiomegaly when accounting for mediastinal fat. Stable upper mediastinal contours, with hilar prominence likely vascular.  Pulmonary hyperinflation and emphysematous  change. Scarring present at both bases. There is no evidence of superimposed pneumonia, edema, effusion, or pneumothorax. Note that a small volume of the left basilar lung is likely excluded from view.  IMPRESSION: Emphysema without superimposed pneumonia or edema.   Electronically Signed   By: Jorje Guild M.D.   On: 02/26/2014 22:46    Medications: Scheduled Meds: . antiseptic oral rinse  15 mL Mouth Rinse BID  . aspirin EC  81 mg Oral BID  . azithromycin  500 mg Intravenous QHS  . benzonatate  100 mg Oral TID  . cefTRIAXone (ROCEPHIN)  IV  1 g Intravenous QHS  . enoxaparin (LOVENOX) injection  40 mg Subcutaneous Q24H  . furosemide  20 mg Oral q1800  . furosemide  40 mg Oral QAC breakfast  . guaiFENesin  1,200 mg Oral BID  . ipratropium-albuterol  3 mL Nebulization Q4H  . methylPREDNISolone (SOLU-MEDROL) injection  40 mg Intravenous Q12H  . mometasone-formoterol  2 puff Inhalation BID  . omega-3 acid ethyl esters  1,000 mg Oral q morning - 10a  . psyllium  1 packet Oral Daily  . simvastatin  40 mg Oral q1800  . sodium chloride  3 mL Intravenous Q12H      LOS: 2 days   Tracy [name] M.D. [facility] 02/28/2014, 10:42 AM Pager: [phone]  If 7PM-7AM, please contact night-coverage [email or web address] Password [email or web address]  **Disclaimer: This note was dictated with voice recognition software. Similar sounding words can inadvertently be transcribed and this note may contain transcription errors which may not have been corrected upon publication of note.**

## 2014-02-28 NOTE — Progress Notes (Signed)
PULMONARY / CRITICAL CARE MEDICINE   Name: Tracy [name] MRN: [id number] DOB: 07/27/1926    ADMISSION DATE:  02/26/2014 CONSULTATION DATE: 6/3  REFERRING MD : [facility] PRIMARY SERVICE: [facility]  CHIEF COMPLAINT:  Dyspnea   BRIEF PATIENT DESCRIPTION:  80 yo male who is followed by Dr. Elsworth Soho for OSA/COPD/spiculated lung mass/hx of PE and reports breathing difficulties x 3 weeks without fever . Chills, sweats or purulent sputum production. He notes sudden increase in dyspnea just prior to admission. Treated 5/28 per NP at Pulmonary office for AECOPD with no pna on c x r. PFT 4/10 showed FVC= 3.00 (77%), FEV1= 1.37 (45%) ,right lower lobe, 7 mm spiculated lung lesion in the right middle lobe by ct scan 2013. PCCM asked to evaluate. With spiculated mass, hx of thromboembolic disease then evaluation for PE seems reasonable. He has bullous empysema and copd by spirometry and c t scan.  SIGNIFICANT EVENTS / STUDIES:  6/3 ct chest:  No demonstrable pulmonary embolus. Extensive bullous emphysema.  There is airspace consolidation in both lower lobes, more on the  left than on the right.   LINES / TUBES:  CULTURES: 6/2 bc>> 6/2 uc>>neg  ANTIBIOTICS: 6/2 roc>> 6/2 zithro>>  HISTORY OF PRESENT ILLNESS:   80 yo male who is followed by Dr. Elsworth Soho for OSA/COPD/ hx of PE and reports breathing difficulties x 3 weeks without fever, chills, sweats or purulent sputum production. He notes sudden increase in dyspnea just prior to admission. Treated 5/28 per NP at Pulmonary office for AECOPD with no pna on c x r. PFT 4/10 showed FVC= 3.00 (77%), FEV1= 1.37 (45%) ,right lower lobe, 7 mm spiculated lung lesion in the right middle lobe by ct scan 2013. PCCM asked to evaluate. With spiculated mass, hx of thromboembolic disease then evaluation for PE seems reasonable. He has bullous empysema and copd by spirometry and c t scan.  SUBJECTIVE:  Feels better and wants to go back to SNF.  VITAL SIGNS: Temp:  [98 F (36.7  C)-99.3 F (37.4 C)] 99.1 F (37.3 C) (06/04 0800) Pulse Rate:  [82-116] 82 (06/04 0800) Resp:  [13-23] 21 (06/04 0800) BP: (102-140)/(56-76) 128/63 mmHg (06/04 0800) SpO2:  [86 %-96 %] 93 % (06/04 0801) FiO2 (%):  [50 %] 50 % (06/03 1957) Weight:  [93.2 kg (205 lb 7.5 oz)] 93.2 kg (205 lb 7.5 oz) (06/04 0559) HEMODYNAMICS:   VENTILATOR SETTINGS: Vent Mode:  [-]  FiO2 (%):  [50 %] 50 % INTAKE / OUTPUT: Intake/Output     06/03 0701 - 06/04 0700 06/04 0701 - 06/05 0700   P.O. 1060 250   IV Piggyback 300    Total Intake(mL/kg) 1360 (14.6) 250 (2.7)   Urine (mL/kg/hr) 1275 (0.6) 300 (1.3)   Total Output 1275 300   Net +85 -50          PHYSICAL EXAMINATION: General:  Disheveled male in no distress . Looks better 6/4 Neuro:  Dull affect otherwise intact HEENT:  Positive chronic clearing throat, post nasal drip Cardiovascular:  HSR RRR Lungs: Decreased bs, rhonchi, no active wheezes Abdomen:  +bs Musculoskeletal:  Intact. Le non tender Skin:  Warm, + le edema  LABS:  CBC  Recent Labs Lab 02/26/14 2228 02/27/14 0258 02/28/14 0310  WBC 19.9* 19.2* 19.3*  HGB 14.0 14.2 12.4*  HCT 44.1 44.2 39.0  PLT 255 222 221   Coag's No results found for this basename: APTT, INR,  in the last 168 hours BMET  Recent Labs  Lab 02/26/14 2228 02/27/14 0258 02/28/14 0310  NA 135* 136* 137  K 5.4* 5.1 4.8  CL 97 98 99  CO2 28 26 28   BUN 30* 30* 35*  CREATININE 1.40* 1.45* 1.49*  GLUCOSE 106* 143* 138*   Electrolytes  Recent Labs Lab 02/26/14 2228 02/27/14 0258 02/28/14 0310  CALCIUM 8.6 8.6 8.6   Sepsis Markers  Recent Labs Lab 02/26/14 2254  LATICACIDVEN 1.32   ABG No results found for this basename: PHART, PCO2ART, PO2ART,  in the last 168 hours Liver Enzymes  Recent Labs Lab 02/26/14 2228  AST 34  ALT 30  ALKPHOS 47  BILITOT 0.5  ALBUMIN 2.7*   Cardiac Enzymes  Recent Labs Lab 02/27/14 0258 02/27/14 0835 02/27/14 1523  TROPONINI <0.30 <0.30  <0.30   Glucose No results found for this basename: GLUCAP,  in the last 168 hours  Imaging Ct Angio Chest Pe W/cm &/or Wo Cm  02/27/2014   CLINICAL DATA:  Difficulty breathing ; history of bladder carcinoma  EXAM: CT ANGIOGRAPHY CHEST WITH CONTRAST  TECHNIQUE: Multidetector CT imaging of the chest was performed using the standard protocol during bolus administration of intravenous contrast. Multiplanar CT image reconstructions and MIPs were obtained to evaluate the vascular anatomy.  CONTRAST:  145mL OMNIPAQUE IOHEXOL 350 MG/ML SOLN  COMPARISON:  Chest CT March 19, 2013 and chest radiograph February 26, 2014  FINDINGS: There is no demonstrable pulmonary embolus. There is atherosclerotic change in the aorta. There is no thoracic aortic aneurysm or dissection, however.  There is widespread bolus type emphysematous change. Multiple large bullae are identified throughout the lungs. There is patchy consolidation in both lower lobes, more on the left than on the right.  There are scattered subcentimeter mediastinal lymph nodes but no adenopathy by size criteria. Pericardium is not thickened. There are multiple foci of coronary artery calcification.  In the visualized upper abdomen, there is atherosclerotic change in aorta. Visualized upper abdominal structures otherwise appear unremarkable. There is degenerative change in the thoracic spine. There is slight anterior wedging of a mid thoracic vertebral body. There are no blastic or lytic bone lesions. Visualized thyroid appears unremarkable.  Review of the MIP images confirms the above findings.  IMPRESSION: No demonstrable pulmonary embolus. Extensive bullous emphysema. There is airspace consolidation in both lower lobes, more on the left than on the right. 1   Electronically Signed   By: Lowella Grip M.D.   On: 02/27/2014 12:12   Dg Chest Port 1 View  02/26/2014   CLINICAL DATA:  Shortness of breath.  History pulmonary embolism  EXAM: PORTABLE CHEST - 1 VIEW   COMPARISON:  02/21/2014  FINDINGS: Likely no cardiomegaly when accounting for mediastinal fat. Stable upper mediastinal contours, with hilar prominence likely vascular.  Pulmonary hyperinflation and emphysematous change. Scarring present at both bases. There is no evidence of superimposed pneumonia, edema, effusion, or pneumothorax. Note that a small volume of the left basilar lung is likely excluded from view.  IMPRESSION: Emphysema without superimposed pneumonia or edema.   Electronically Signed   By: Jorje Guild M.D.   On: 02/26/2014 22:46       ASSESSMENT / PLAN:  PULMONARY A:Acute on chronic resp failure     OSA    COPD w severe Bullous emphysema    B LL CAP, best seen CT scan 02/27/14    Hx RML Spiculated nodule > smaller and less nodular on CT's from 02/2013 and 02/27/14, likely benign P:   BD; home regimen  is Advair + spiriva, would change back soon Would change solumedrol to prednisone and taper over 7-10 days to zero O2 as needed Agree with IV abx for another day, then transition to PO 6/5 Cpap Q hs   CARDIOVASCULAR A: Cor pulmonale      Aortic valve disease      HTN P:  Antihypertensives as needed Negative trop > follow  RENAL Lab Results  Component Value Date   CREATININE 1.49* 02/28/2014   CREATININE 1.45* 02/27/2014   CREATININE 1.40* 02/26/2014   CREATININE 1.46* 10/09/2013   CREATININE 1.23 10/04/2012   CREATININE 1.39* 09/14/2012   A:  Mild renal insuff, appears to have tolerated dye-load from CT scan  P:   Follow BMP Avoid other nephrotoxins  HEMATOLOGIC A:  Hx of PE, none on this admission P:   INFECTIOUS A:  Recent Tx as outpatient with Augmentin, failed therapy and admitted. Improving on IV abx P:   Currently on tx for cap, attempt to transition to PO abx 6/5  NEUROLOGIC A:  Hx of [place] strokes in the past.  P:   Follow   GLOBAL:  Would transfer out of SDU to floor, hopefully d/c soon. Transition abx, BD's, steroids to PO. The RML nodule has  shrunk and is unchanged for over a year > likely benign. F/u arranged with Dr Elsworth Soho for 6/29 at 2:15pm. Call if we can help further prior to discharge   [facility] Minor ACNP Maryanna Shape PCCM Pager [phone] till 3 pm If no answer page [phone] 02/28/2014, 9:38 AM   Baltazar Apo, MD, PhD 02/28/2014, 10:51 AM [facility] [phone] or if no answer [phone]

## 2014-02-28 NOTE — Progress Notes (Signed)
  Echocardiogram 2D Echocardiogram has been performed.  Tracy [name] 02/28/2014, 4:26 PM

## 2014-03-01 LAB — CBC
HEMATOCRIT: 39.8 % (ref 39.0–52.0)
Hemoglobin: 12.6 g/dL — ABNORMAL LOW (ref 13.0–17.0)
MCH: 27.7 pg (ref 26.0–34.0)
MCHC: 31.7 g/dL (ref 30.0–36.0)
MCV: 87.5 fL (ref 78.0–100.0)
PLATELETS: 265 10*3/uL (ref 150–400)
RBC: 4.55 MIL/uL (ref 4.22–5.81)
RDW: 15.9 % — ABNORMAL HIGH (ref 11.5–15.5)
WBC: 19.3 10*3/uL — AB (ref 4.0–10.5)

## 2014-03-01 LAB — BASIC METABOLIC PANEL
BUN: 38 mg/dL — ABNORMAL HIGH (ref 6–23)
CO2: 28 mEq/L (ref 19–32)
Calcium: 8.6 mg/dL (ref 8.4–10.5)
Chloride: 101 mEq/L (ref 96–112)
Creatinine, Ser: 1.72 mg/dL — ABNORMAL HIGH (ref 0.50–1.35)
GFR calc Af Amer: 40 mL/min — ABNORMAL LOW (ref >90)
GFR calc non Af Amer: 34 mL/min — ABNORMAL LOW (ref >90)
Glucose, Bld: 132 mg/dL — ABNORMAL HIGH (ref 70–99)
Potassium: 4.3 mEq/L (ref 3.7–5.3)
Sodium: 142 mEq/L (ref 137–147)

## 2014-03-01 MED ORDER — SIMVASTATIN 10 MG PO TABS
10.0000 mg | ORAL_TABLET | Freq: Every day | ORAL | Status: DC
  Administered 2014-03-01: 10 mg via ORAL
  Filled 2014-03-01 (×2): qty 1

## 2014-03-01 MED ORDER — PREDNISONE 20 MG PO TABS
40.0000 mg | ORAL_TABLET | Freq: Every day | ORAL | Status: DC
  Administered 2014-03-01 – 2014-03-02 (×2): 40 mg via ORAL
  Filled 2014-03-01 (×3): qty 2

## 2014-03-01 MED ORDER — DILTIAZEM HCL ER COATED BEADS 180 MG PO CP24
180.0000 mg | ORAL_CAPSULE | Freq: Every day | ORAL | Status: DC
  Administered 2014-03-01 – 2014-03-02 (×2): 180 mg via ORAL
  Filled 2014-03-01 (×2): qty 1

## 2014-03-01 NOTE — Progress Notes (Signed)
Pt placed on Auto BIPAP with 8 LPM O2 bleed in via FFM.  Current settings are 8-20 CMH2O and pt is tolerating well at this time, RT to monitor and assess as needed.

## 2014-03-01 NOTE — Evaluation (Signed)
Physical Therapy Evaluation Patient Details Name: Tracy [name] MRN: [id number] DOB: [birth year] Today's Date: 03/01/2014   History of Present Illness  80 yo male adm with incr SOB; PMHx: COPD, O2 dependent, renal CA, HTN, OSA  Clinical Impression  Pt doing well but does desat with activity, significant DOEl should be able to transition back to independent living at [facility] with [place]; He would like to work up to being able to amb to DR 2x/day and not be dependent on his scooter as well as developing a HEP he can do in the exercise facility at [facility];    Follow Up Recommendations Home health PT    Equipment Recommendations  None recommended by PT    Recommendations for Other Services       Precautions / Restrictions Precautions Precautions: Fall;Other (comment) Precaution Comments: O2      Mobility  Bed Mobility Overal bed mobility: Modified Independent                Transfers Overall transfer level: Modified independent   Transfers: Sit to/from [employer] transfer comment: initial supervision for O2 line, safety, then mod I  Ambulation/Gait Ambulation/Gait assistance: Supervision Ambulation Distance (Feet): 45 Feet Assistive device: None Gait Pattern/deviations: Wide base of support;Step-through pattern     General Gait Details: supervision for safety; pt able to manage O2 carrier on his own  Stairs            Wheelchair Mobility    Modified Rankin (Stroke Patients Only)       Balance Overall balance assessment: Needs assistance   Sitting balance-Leahy Scale: Normal       Standing balance-Leahy Scale: Good                               Pertinent Vitals/Pain Denies pain; O2 sats on 8-10L O2  resting 88% amb 79% After 2 min rest 86%  Pt reports baseline sats are 88-93% on 8L    Home Living Family/patient expects to be discharged to:: Other (Comment) ([facility] living)                Home Equipment: Youth worker - 4 wheels Additional Comments: was amb to DR until recently; unable to amb full distance d/t DOE    Prior Function Level of Independence: Independent               Hand Dominance        Extremity/Trunk Assessment   Upper Extremity Assessment: Overall WFL for tasks assessed           Lower Extremity Assessment: Overall WFL for tasks assessed         Communication   Communication: No difficulties  Cognition Arousal/Alertness: Awake/alert Behavior During Therapy: WFL for tasks assessed/performed Overall Cognitive Status: Within Functional Limits for tasks assessed                      General Comments      Exercises        Assessment/Plan    PT Assessment All further PT needs can be met in the next venue of care  PT Diagnosis     PT Problem List Cardiopulmonary status limiting activity  PT Treatment Interventions     PT Goals (Current goals can be found in the Care Plan section) Acute Rehab PT Goals Patient Stated  Goal: get back to wood working shop and be able to walk to DR again PT Goal Formulation: With patient Potential to Achieve Goals: Good    Frequency     Barriers to discharge        Co-evaluation               End of Session Equipment Utilized During Treatment: Oxygen Activity Tolerance: Treatment limited secondary to medical complications (Comment) (desats with activity slightly more than his baseline ) Patient left: in chair;with chair alarm set Nurse Communication: Mobility status         Time: 8138-8719 PT Time Calculation (min): 30 min   Charges:   PT Evaluation $Initial PT Evaluation Tier I: 1 Procedure PT Treatments $Gait Training: 8-22 mins $Therapeutic Activity: 8-22 mins   PT G Codes:          Neil Crouch 03/01/2014, 2:32 PM

## 2014-03-01 NOTE — Evaluation (Signed)
Occupational Therapy Evaluation Patient Details Name: Tracy [name] MRN: [id number] DOB: [birth year] Today's Date: 03/01/2014    History of Present Illness 80 yo male adm with incr SOB; PMHx: COPD, O2 dependent, renal CA, HTN, OSA   Clinical Impression   This pt was admitted with dyspnea on exertion.  At baseline, he is mod I with adls and he is close to his baseline now.  Reviewed energy conservation and pt verbalizes understanding.  He does not need any further OT at this time.     Follow Up Recommendations  No OT follow up    Equipment Recommendations  Other (comment) (pt is looking into a shower stool himself)    Recommendations for Other Services       Precautions / Restrictions Precautions Precautions: Fall Precaution Comments: O2 Restrictions Weight Bearing Restrictions: No      Mobility Bed Mobility Overal bed mobility: Modified Independent                Transfers Overall transfer level: Modified independent   Transfers: Sit to/from Stand           General transfer comment: from recliner    Balance Overall balance assessment: Needs assistance   Sitting balance-Leahy Scale: Normal       Standing balance-Leahy Scale: Good                              ADL Overall ADL's : At baseline                                       General ADL Comments: Pt able to retrieve clothes and complete adls at baseline.  He is able to cross legs for LB adls or bend down.  Recommended crossing legs for breathing.    Educated on energy conservation, and he performs several strategies.  He is looking into a shower stool--he has a small shower and I recommended he get an adjustable model.  Pt wants to build walking tolerance.  He uses his scooter more than he would like to.  Desaturates with activity. Pt does initiate pursed lip breathing     Vision                     Perception     Praxis      Pertinent Vitals/Pain R  shoulder sore: modified eval     Hand Dominance Right   Extremity/Trunk Assessment Upper Extremity Assessment Upper Extremity Assessment: RUE deficits/detail RUE Deficits / Details: painful shoulder.  Able to lift to 90 degrees without pain  Pt compensates using LUE to comb hair as needed   Lower Extremity Assessment Lower Extremity Assessment: Overall WFL for tasks assessed       Communication Communication Communication: No difficulties   Cognition Arousal/Alertness: Awake/alert Behavior During Therapy: WFL for tasks assessed/performed Overall Cognitive Status: Within Functional Limits for tasks assessed                     General Comments       Exercises       Shoulder Instructions      Home Living Family/patient expects to be discharged to:: Private residence Living Arrangements: Spouse/significant other Available Help at Discharge: Family Type of Home: Independent living facility [facility])  Bathroom Shower/Tub: Occupational psychologist: Handicapped height     Home Equipment: Youth worker - 4 wheels   Additional Comments: pt is looking into shower stool and hand held shower      Prior Functioning/Environment Level of Independence: Independent             OT Diagnosis:     OT Problem List:     OT Treatment/Interventions:      OT Goals(Current goals can be found in the care plan section) Acute Rehab OT Goals Patient Stated Goal: get back to wood working shop and be able to walk to DR again  OT Frequency:     Barriers to D/C:            Co-evaluation              End of Session    Activity Tolerance: Patient tolerated treatment well Patient left: in chair;with call bell/phone within reach;with chair alarm set   Time: 1449-1506 OT Time Calculation (min): 17 min Charges:  OT General Charges $OT Visit: 1 Procedure OT Evaluation $Initial OT Evaluation Tier I: 1 Procedure OT  Treatments $Self Care/Home Management : 8-22 mins G-Codes:    Lesle Chris 08-Mar-2014, 3:27 PM  Lesle Chris, OTR/L [phone] 03-08-14

## 2014-03-01 NOTE — Progress Notes (Signed)
Patient ID: Tracy [name]  male  DGU:[id number]    DOB: 1926/03/03    DOA: 02/26/2014  PCP: Penni Homans, MD  Assessment/Plan: Principal Problem:   Acute on chronic respiratory failure secondary to COPD exacerbation, CAP, OSA, bilateral pneumonia/consolidation - Off BiPAP, influenza negative, urine strep antigen negative - Continue IV Zithromax, Rocephin, nebulizer scheduled, Tessalon Perles, Robitussin as needed -  Taper IV supplemental, start on oral prednisone in a.m. - Appreciate pulmonology recommendations - Desats easily with minimal exertion, at baseline on 8 L O2 via nasal cannula  Active Problems: Tachycardia/MAT:  - Cardiology was consulted, patient was seen by Dr. Ellyn Hack and Dr. Radford Pax likely MAT then A. fib. Dr. Radford Pax recommended today to change beta blocker to Cardizem due to his severe emphysema and COPD. She recommended Cardizem 180 mg daily. - Hold off on anticoagulation per cardiology - 2-D echo showed EF of 95-63%, grade 1 diastolic dysfunction mild to moderate aortic stenosis    OBSTRUCTIVE SLEEP APNEA  - continue CPAP/BiPAP per respiratory therapist    CAP (community acquired pneumonia) - As #1   History of PE - D-dimer elevated, CT angiogram of the chest negative for any PE but showed airspace consolidation in both lower lobes more on the left - Doppler ultrasound of the lower extremities negative for DVT  Hypertension: -Hold ARB for now, BP stable    COPD (chronic obstructive pulmonary disease) - As #1   Acute renal insufficiency on CKD stage III - Baseline creatinine 1.4, today 1.7, hold Lasix  Spiculated mass: Not seen on most recent CT chest done yesterday, pulmonology following   Petechiae on his lower abdomen: - Patient incidentally saw it last week during shower, denies any pain in the area, states happened because of coughing. Patient also reports that similar petechia/hematoma previously which resolved spontaneously. Platelets normal. - Hold  off on Lovenox for now  DVT Prophylaxis: SCDs   Code Status: Full code   Family Communication: patient alert and oriented x4, discussed with the patient   Disposition: Hopefully DC to his facility tomorrow, also get PT OT evaluation today  Consultants:  pulmonology   Cardiology  Procedures:  None   Antibiotics:  IV Rocephin    IV Zithromax   Subjective:  patient seen and examined, currently having no acute complaints, no chest pain or shortness of breath  Objective: Weight change:   Intake/Output Summary (Last 24 hours) at 03/01/14 1037 Last data filed at 03/01/14 0944  Gross per 24 hour  Intake    490 ml  Output   2030 ml  Net  -1540 ml   Blood pressure 137/93, pulse 83, temperature 98 F (36.7 C), temperature source Oral, resp. rate 15, height 5\' 10"  (1.778 m), weight 93.2 kg (205 lb 7.5 oz), SpO2 93.00%.  Physical Exam: General: Alert and awake, oriented x3, not in any acute distress. CVS: S1-S2 clear, no murmur rubs or gallops Chest:  Decreased lung sounds at the bases, no wheezing Abdomen: soft nontender, nondistended, normal bowel sounds  Extremities: no cyanosis, clubbing or edema noted bilaterally, SCDs   Lab Results: Basic Metabolic Panel:  Recent Labs Lab 02/28/14 0310 03/01/14 0433  NA 137 142  K 4.8 4.3  CL 99 101  CO2 28 28  GLUCOSE 138* 132*  BUN 35* 38*  CREATININE 1.49* 1.72*  CALCIUM 8.6 8.6   Liver Function Tests:  Recent Labs Lab 02/26/14 2228  AST 34  ALT 30  ALKPHOS 47  BILITOT 0.5  PROT 6.7  ALBUMIN 2.7*   No results found for this basename: LIPASE, AMYLASE,  in the last 168 hours No results found for this basename: AMMONIA,  in the last 168 hours CBC:  Recent Labs Lab 02/26/14 2228  02/28/14 0310 03/01/14 0433  WBC 19.9*  < > 19.3* 19.3*  NEUTROABS 17.1*  --   --   --   HGB 14.0  < > 12.4* 12.6*  HCT 44.1  < > 39.0 39.8  MCV 87.7  < > 86.5 87.5  PLT 255  < > 221 265  < > = values in this interval not  displayed. Cardiac Enzymes:  Recent Labs Lab 02/27/14 0258 02/27/14 0835 02/27/14 1523  TROPONINI <0.30 <0.30 <0.30   BNP: No components found with this basename: POCBNP,  CBG: No results found for this basename: GLUCAP,  in the last 168 hours   Micro Results: Recent Results (from the past 240 hour(s))  CULTURE, BLOOD (ROUTINE X 2)     Status: None   Collection Time    02/26/14 10:28 PM      Result Value Ref Range Status   Specimen Description BLOOD LEFT ANTECUBITAL   Final   Special Requests BOTTLES DRAWN AEROBIC AND ANAEROBIC 5CC   Final   Culture  Setup Time     Final   Value: 02/27/2014 01:08     Performed at [facility]   Culture     Final   Value:        BLOOD CULTURE RECEIVED NO GROWTH TO DATE CULTURE WILL BE HELD FOR 5 DAYS BEFORE ISSUING A FINAL NEGATIVE REPORT     Performed at [facility]   Report Status PENDING   Incomplete  URINE CULTURE     Status: None   Collection Time    02/26/14 10:39 PM      Result Value Ref Range Status   Specimen Description URINE, RANDOM   Final   Special Requests NONE   Final   Culture  Setup Time     Final   Value: 02/27/2014 02:02     Performed at [facility]     Final   Value: NO GROWTH     Performed at [facility]   Culture     Final   Value: NO GROWTH     Performed at [facility]   Report Status 02/28/2014 FINAL   Final  CULTURE, BLOOD (ROUTINE X 2)     Status: None   Collection Time    02/26/14 10:45 PM      Result Value Ref Range Status   Specimen Description BLOOD RIGHT ANTECUBITAL   Final   Special Requests BOTTLES DRAWN AEROBIC AND ANAEROBIC 5CC   Final   Culture  Setup Time     Final   Value: 02/27/2014 01:08     Performed at [facility]   Culture     Final   Value:        BLOOD CULTURE RECEIVED NO GROWTH TO DATE CULTURE WILL BE HELD FOR 5 DAYS BEFORE ISSUING A FINAL NEGATIVE REPORT     Performed at [facility]   Report Status  PENDING   Incomplete  MRSA PCR SCREENING     Status: None   Collection Time    02/27/14  1:25 AM      Result Value Ref Range Status   MRSA by PCR NEGATIVE  NEGATIVE Final   Comment:  The GeneXpert MRSA Assay (FDA     approved for NASAL specimens     only), is one component of a     comprehensive MRSA colonization     surveillance program. It is not     intended to diagnose MRSA     infection nor to guide or     monitor treatment for     MRSA infections.    Studies/Results: Dg Chest 2 View  02/21/2014   CLINICAL DATA:  Cough, former smoking history, COPD  EXAM: CHEST  2 VIEW  COMPARISON:  Chest x-ray of 10/11/2013  FINDINGS: The lungs are hyperaerated with somewhat flattened hemidiaphragms suggesting emphysema. There are prominent markings primarily at the lung bases the majority of which appear chronic. There are somewhat more prominent markings on the lateral view in the lung base posteriorly and superimposed pneumonia would be difficult to exclude. Followup chest x-ray is recommended if warranted clinically. No effusion is seen. The heart is mildly enlarged and stable. There are degenerative changes throughout the thoracic spine.  IMPRESSION: 1. Emphysema and probable basilar fibrosis. Somewhat more prominent markings on the lateral view at the lung base could represent superimposed pneumonia and followup is recommended if warranted clinically. 2. Stable cardiomegaly.   Electronically Signed   By: Ivar Drape M.D.   On: 02/21/2014 10:17   Dg Chest Port 1 View  02/26/2014   CLINICAL DATA:  Shortness of breath.  History pulmonary embolism  EXAM: PORTABLE CHEST - 1 VIEW  COMPARISON:  02/21/2014  FINDINGS: Likely no cardiomegaly when accounting for mediastinal fat. Stable upper mediastinal contours, with hilar prominence likely vascular.  Pulmonary hyperinflation and emphysematous change. Scarring present at both bases. There is no evidence of superimposed pneumonia, edema, effusion, or  pneumothorax. Note that a small volume of the left basilar lung is likely excluded from view.  IMPRESSION: Emphysema without superimposed pneumonia or edema.   Electronically Signed   By: Jorje Guild M.D.   On: 02/26/2014 22:46    Medications: Scheduled Meds: . antiseptic oral rinse  15 mL Mouth Rinse BID  . aspirin EC  81 mg Oral BID  . azithromycin  500 mg Intravenous QHS  . benzonatate  100 mg Oral TID  . cefTRIAXone (ROCEPHIN)  IV  1 g Intravenous QHS  . diltiazem  180 mg Oral Daily  . guaiFENesin  1,200 mg Oral BID  . ipratropium-albuterol  3 mL Nebulization Q4H  . mometasone-formoterol  2 puff Inhalation BID  . omega-3 acid ethyl esters  1,000 mg Oral q morning - 10a  . predniSONE  40 mg Oral Q breakfast  . psyllium  1 packet Oral Daily  . simvastatin  10 mg Oral q1800  . sodium chloride  3 mL Intravenous Q12H      LOS: 3 days   [name] M.D. [facility] 03/01/2014, 10:37 AM Pager: [phone]  If 7PM-7AM, please contact night-coverage [email or web address] Password [email or web address]  **Disclaimer: This note was dictated with voice recognition software. Similar sounding words can inadvertently be transcribed and this note may contain transcription errors which may not have been corrected upon publication of note.**

## 2014-03-01 NOTE — Progress Notes (Signed)
SUBJECTIVE:  No complaints  OBJECTIVE:   Vitals:   Filed Vitals:   02/28/14 2000 02/28/14 2027 02/28/14 2115 03/01/14 0100  BP:  137/93    Pulse:  83    Temp: 98.1 F (36.7 C) 98 F (36.7 C)    TempSrc: Oral Oral    Resp:      Height:      Weight:      SpO2:  96% 92% 91%   I&O's:   Intake/Output Summary (Last 24 hours) at 03/01/14 0837 Last data filed at 03/01/14 0801  Gross per 24 hour  Intake    390 ml  Output   2030 ml  Net  -1640 ml   TELEMETRY: Reviewed telemetry pt in MAT     PHYSICAL EXAM General: Well developed, well nourished, in no acute distress Head: Eyes PERRLA, No xanthomas.   Normal cephalic and atramatic  Lungs:   Clear bilaterally to auscultation and percussion. Heart:   irregular S1 S2 Pulses are 2+ & equal. Abdomen: Bowel sounds are positive, abdomen soft and non-tender without masses Extremities:   No clubbing, cyanosis or edema.  DP +1 Neuro: Alert and oriented X 3. Psych:  Good affect, responds appropriately   LABS: Basic Metabolic Panel:  Recent Labs  02/28/14 0310 03/01/14 0433  NA 137 142  K 4.8 4.3  CL 99 101  CO2 28 28  GLUCOSE 138* 132*  BUN 35* 38*  CREATININE 1.49* 1.72*  CALCIUM 8.6 8.6   Liver Function Tests:  Recent Labs  02/26/14 2228  AST 34  ALT 30  ALKPHOS 47  BILITOT 0.5  PROT 6.7  ALBUMIN 2.7*   No results found for this basename: LIPASE, AMYLASE,  in the last 72 hours CBC:  Recent Labs  02/26/14 2228  02/28/14 0310 03/01/14 0433  WBC 19.9*  < > 19.3* 19.3*  NEUTROABS 17.1*  --   --   --   HGB 14.0  < > 12.4* 12.6*  HCT 44.1  < > 39.0 39.8  MCV 87.7  < > 86.5 87.5  PLT 255  < > 221 265  < > = values in this interval not displayed. Cardiac Enzymes:  Recent Labs  02/27/14 0258 02/27/14 0835 02/27/14 1523  TROPONINI <0.30 <0.30 <0.30   BNP: No components found with this basename: POCBNP,  D-Dimer:  Recent Labs  02/27/14 0258  DDIMER 1.62*   Hemoglobin A1C: No results found for  this basename: HGBA1C,  in the last 72 hours Fasting Lipid Panel: No results found for this basename: CHOL, HDL, LDLCALC, TRIG, CHOLHDL, LDLDIRECT,  in the last 72 hours Thyroid Function Tests:  Recent Labs  02/27/14 0258  TSH 1.240   Anemia Panel: No results found for this basename: VITAMINB12, FOLATE, FERRITIN, TIBC, IRON, RETICCTPCT,  in the last 72 hours Coag Panel:   Lab Results  Component Value Date   INR 1.2* 06/21/2013   INR 1.07 06/19/2013   INR 2.2 05/23/2013   PROTIME 14.4* 06/21/2013    RADIOLOGY: Dg Chest 2 View  02/21/2014   CLINICAL DATA:  Cough, former smoking history, COPD  EXAM: CHEST  2 VIEW  COMPARISON:  Chest x-ray of 10/11/2013  FINDINGS: The lungs are hyperaerated with somewhat flattened hemidiaphragms suggesting emphysema. There are prominent markings primarily at the lung bases the majority of which appear chronic. There are somewhat more prominent markings on the lateral view in the lung base posteriorly and superimposed pneumonia would be difficult to exclude. Followup chest x-ray  is recommended if warranted clinically. No effusion is seen. The heart is mildly enlarged and stable. There are degenerative changes throughout the thoracic spine.  IMPRESSION: 1. Emphysema and probable basilar fibrosis. Somewhat more prominent markings on the lateral view at the lung base could represent superimposed pneumonia and followup is recommended if warranted clinically. 2. Stable cardiomegaly.   Electronically Signed   By: Ivar Drape M.D.   On: 02/21/2014 10:17   Ct Angio Chest Pe W/cm &/or Wo Cm  02/27/2014   CLINICAL DATA:  Difficulty breathing ; history of bladder carcinoma  EXAM: CT ANGIOGRAPHY CHEST WITH CONTRAST  TECHNIQUE: Multidetector CT imaging of the chest was performed using the standard protocol during bolus administration of intravenous contrast. Multiplanar CT image reconstructions and MIPs were obtained to evaluate the vascular anatomy.  CONTRAST:  17mL OMNIPAQUE  IOHEXOL 350 MG/ML SOLN  COMPARISON:  Chest CT March 19, 2013 and chest radiograph February 26, 2014  FINDINGS: There is no demonstrable pulmonary embolus. There is atherosclerotic change in the aorta. There is no thoracic aortic aneurysm or dissection, however.  There is widespread bolus type emphysematous change. Multiple large bullae are identified throughout the lungs. There is patchy consolidation in both lower lobes, more on the left than on the right.  There are scattered subcentimeter mediastinal lymph nodes but no adenopathy by size criteria. Pericardium is not thickened. There are multiple foci of coronary artery calcification.  In the visualized upper abdomen, there is atherosclerotic change in aorta. Visualized upper abdominal structures otherwise appear unremarkable. There is degenerative change in the thoracic spine. There is slight anterior wedging of a mid thoracic vertebral body. There are no blastic or lytic bone lesions. Visualized thyroid appears unremarkable.  Review of the MIP images confirms the above findings.  IMPRESSION: No demonstrable pulmonary embolus. Extensive bullous emphysema. There is airspace consolidation in both lower lobes, more on the left than on the right. 1   Electronically Signed   By: Lowella Grip M.D.   On: 02/27/2014 12:12   Dg Chest Port 1 View  02/26/2014   CLINICAL DATA:  Shortness of breath.  History pulmonary embolism  EXAM: PORTABLE CHEST - 1 VIEW  COMPARISON:  02/21/2014  FINDINGS: Likely no cardiomegaly when accounting for mediastinal fat. Stable upper mediastinal contours, with hilar prominence likely vascular.  Pulmonary hyperinflation and emphysematous change. Scarring present at both bases. There is no evidence of superimposed pneumonia, edema, effusion, or pneumothorax. Note that a small volume of the left basilar lung is likely excluded from view.  IMPRESSION: Emphysema without superimposed pneumonia or edema.   Electronically Signed   By: Jorje Guild  M.D.   On: 02/26/2014 22:46   IMPRESSION:  Principal Problem:  Dyspnea on exertion secondary to PNA Active Problems:  Acute cor pulmonale  Multifocal atrial tachycardia  OBSTRUCTIVE SLEEP APNEA  COPD- severe  Polycythemia- secondary to COPD  HYPERLIPIDEMIA  HYPERTENSION, BORDERLINE  PE/DVT 2014- now off Coumadin, CTA negative   RECOMMENDATION:  Pleasant elderly man with significant COPD & no prior h/o CAD or cardia arrhythmia - admitted for increased dyspnea and PNA. Was apparenly improving with plan to transfer out of SDU & developed and irregular RVR rhythm that on tele is hard to determine MAT vs. Afib. 2D echo with normal LVF, mild to moderate AS and mild pulmonary HTN.   -  MAT would go along with worsening pulmonary function; Increase Lopressor to 25mg  BID if ok with pulmonary and if BB not effective would change over  to Diltiazem.  -- for now, would hold off on Anticoagulation until we know a true rhythm Dx - MAT is not usually an indication for anticoagulation.    Sueanne Margarita, MD  03/01/2014  8:37 AM

## 2014-03-01 NOTE — Care Management Note (Addendum)
    Page 1 of 2   03/01/2014     4:20:28 PM CARE MANAGEMENT NOTE 03/01/2014  Patient:  Tracy [name], Tracy [name]   Account Number:  [id number]  Date Initiated:  02/27/2014  Documentation initiated by:  DAVIS,RHONDA  Subjective/Objective Assessment:   pt with hx of copd, bullous emphysema/increased wob,hypoxic on admit,poss pna versus copd excerbation.     Action/Plan:   [facility]DEP [facility].   Anticipated DC Date:  03/02/2014   Anticipated DC Plan:  [place]  In-house referral  NA      DC Planning Services  CM consult      [facility] Choice  NA   Choice offered to / List presented to:     DME arranged  NA      DME agency  NA        Status of service:  In process, will continue to follow Medicare Important Message given?  NA - LOS <3 / Initial given by admissions (If response is "NO", the following Medicare IM given date fields will be blank) Date Medicare IM given:   Date Additional Medicare IM given:    Discharge Disposition:    Per UR Regulation:  Reviewed for med. necessity/level of care/duration of stay  If discussed at [place] of Stay Meetings, dates discussed:    Comments:  03/01/14 Jerime Arif NR,BSN NCM [phone] PT-HH. [facility] DEPT @[phone]  [phone] PROVIDES THEIR OWN HHPT.JUST NEED TO [facility] ORDER TO FAX#[phone] [place], RN, BSN, [place] [phone] Chart Reviewed for discharge and hospital needs. Discharge needs at time of review: None present will follow for needs. Review of patient progress due on [id number].

## 2014-03-01 NOTE — Progress Notes (Signed)
Agree with the previous RN's assessment. Will continue to monitor patient.

## 2014-03-02 LAB — BASIC METABOLIC PANEL
BUN: 44 mg/dL — AB (ref 6–23)
CO2: 27 meq/L (ref 19–32)
CREATININE: 1.47 mg/dL — AB (ref 0.50–1.35)
Calcium: 8.6 mg/dL (ref 8.4–10.5)
Chloride: 102 mEq/L (ref 96–112)
GFR calc non Af Amer: 42 mL/min — ABNORMAL LOW (ref >90)
GFR, EST AFRICAN AMERICAN: 48 mL/min — AB (ref >90)
Glucose, Bld: 108 mg/dL — ABNORMAL HIGH (ref 70–99)
Potassium: 5.1 mEq/L (ref 3.7–5.3)
Sodium: 140 mEq/L (ref 137–147)

## 2014-03-02 LAB — CBC
HEMATOCRIT: 41.8 % (ref 39.0–52.0)
Hemoglobin: 13.1 g/dL (ref 13.0–17.0)
MCH: 27 pg (ref 26.0–34.0)
MCHC: 31.3 g/dL (ref 30.0–36.0)
MCV: 86 fL (ref 78.0–100.0)
Platelets: 292 10*3/uL (ref 150–400)
RBC: 4.86 MIL/uL (ref 4.22–5.81)
RDW: 16.1 % — AB (ref 11.5–15.5)
WBC: 13.7 10*3/uL — AB (ref 4.0–10.5)

## 2014-03-02 MED ORDER — BENZONATATE 100 MG PO CAPS: 100.0000 mg | ORAL_CAPSULE | Freq: Three times a day (TID) | ORAL | Status: DC

## 2014-03-02 MED ORDER — ALBUTEROL SULFATE (2.5 MG/3ML) 0.083% IN NEBU
2.5000 mg | INHALATION_SOLUTION | Freq: Four times a day (QID) | RESPIRATORY_TRACT | Status: DC
  Filled 2014-03-02: qty 3

## 2014-03-02 MED ORDER — DILTIAZEM HCL ER COATED BEADS 180 MG PO CP24: 180.0000 mg | ORAL_CAPSULE | Freq: Every day | ORAL | Status: DC

## 2014-03-02 MED ORDER — PREDNISONE 10 MG PO TABS: ORAL_TABLET | ORAL | Status: DC

## 2014-03-02 MED ORDER — ALBUTEROL SULFATE (2.5 MG/3ML) 0.083% IN NEBU: 2.5000 mg | INHALATION_SOLUTION | Freq: Four times a day (QID) | RESPIRATORY_TRACT | Status: DC

## 2014-03-02 MED ORDER — GUAIFENESIN-CODEINE 100-10 MG/5ML PO SOLN: 5.0000 mL | Freq: Four times a day (QID) | ORAL | Status: DC | PRN

## 2014-03-02 MED ORDER — LEVOFLOXACIN 750 MG PO TABS: 750.0000 mg | ORAL_TABLET | ORAL | Status: DC

## 2014-03-02 MED ORDER — LEVOFLOXACIN 750 MG PO TABS
750.0000 mg | ORAL_TABLET | Freq: Once | ORAL | Status: AC
  Administered 2014-03-02: 750 mg via ORAL
  Filled 2014-03-02: qty 1

## 2014-03-02 MED ORDER — TIOTROPIUM BROMIDE MONOHYDRATE 18 MCG IN CAPS
18.0000 ug | ORAL_CAPSULE | Freq: Every day | RESPIRATORY_TRACT | Status: DC
  Filled 2014-03-02: qty 5

## 2014-03-02 NOTE — Discharge Summary (Signed)
Physician Discharge Summary  Patient ID: Tracy [name] MRN: [id number] DOB/AGE: 02/22/1926 80 y.o.  Admit date: 02/26/2014 Discharge date: 03/02/2014  Primary Care Physician:  Penni Homans, MD  Discharge Diagnoses:    Acute on chronic respiratory failure - now currently at baseline  Community acquired pneumonia  . Multifocal atrial tachycardia . OBSTRUCTIVE SLEEP APNEA . Dyspnea on exertion . COPD- severe . HYPERLIPIDEMIA . HYPERTENSION, BORDERLINE . Polycythemia- secondary to COPD . Acute cor pulmonale . PE/DVT 2014- now off Coumadin, CTA negative   Consults:  Pulmonology, Dr. Lamonte Sakai                    Cardiology, Dr. Ellyn Hack  Recommendations for Outpatient Follow-up:    Allergies:   Allergies  Allergen Reactions  . Sulfamethoxazole Other (See Comments)    REACTION: "washes" him out per pt     Discharge Medications:   Medication List    STOP taking these medications       amoxicillin-clavulanate 875-125 MG per tablet  Commonly known as:  AUGMENTIN     losartan 25 MG tablet  Commonly known as:  COZAAR      TAKE these medications       ADVAIR DISKUS 250-50 MCG/DOSE Aepb  Generic drug:  Fluticasone-Salmeterol  USE ONE INHALATION TWICE A DAY.     albuterol (2.5 MG/3ML) 0.083% nebulizer solution  Commonly known as:  PROVENTIL  Take 3 mLs (2.5 mg total) by nebulization 4 (four) times daily. And every 4 hours as needed for SOB/wheezing     aspirin 81 MG tablet  Take 81 mg by mouth 2 (two) times daily.     benzonatate 100 MG capsule  Commonly known as:  TESSALON  Take 1 capsule (100 mg total) by mouth 3 (three) times daily.     diltiazem 180 MG 24 hr capsule  Commonly known as:  CARDIZEM CD  Take 1 capsule (180 mg total) by mouth daily.     Fish Oil 1200 MG Caps  Take 1,200 mg by mouth every morning.     furosemide 40 MG tablet  Commonly known as:  LASIX  Take 20-40 mg by mouth 2 (two) times daily. 40 mg in the morning and 20 mg in the evening      furosemide 40 MG tablet  Commonly known as:  LASIX  TAKE ONE TABLET DAILY IN THE MORNING     guaiFENesin 600 MG 12 hr tablet  Commonly known as:  MUCINEX  Take 1,200 mg by mouth 2 (two) times daily.     guaiFENesin-codeine 100-10 MG/5ML syrup  Take 5 mLs by mouth every 6 (six) hours as needed for cough.     levofloxacin 750 MG tablet  Commonly known as:  LEVAQUIN  Take 1 tablet (750 mg total) by mouth every other day. X 7 doses  Start taking on:  03/04/2014     predniSONE 10 MG tablet  Commonly known as:  DELTASONE  Prednisone dosing: Take  Prednisone 40mg  (4 tabs) x 3 days, then taper to 30mg  (3 tabs) x 3 days, then 20mg  (2 tabs) x 3days, then 10mg  (1 tab) x 3days, then OFF.     psyllium 58.6 % packet  Commonly known as:  METAMUCIL  Take 1 packet by mouth daily.     simvastatin 40 MG tablet  Commonly known as:  ZOCOR  TAKE ONE (1) TABLET EACH DAY     SPIRIVA HANDIHALER 18 MCG inhalation capsule  Generic drug:  tiotropium  INHALE THE CONTENTS OF ONE CAPSULE DAILY     TYLENOL ARTHRITIS PAIN 650 MG CR tablet  Generic drug:  acetaminophen  Take 650 mg by mouth every morning.         Brief H and P: For complete details please refer to admission H and P, but in brief Tracy [name] is a 80 y.o. male history of COPD on home oxygen, OSA, cor pulmonale, hypertension, previous history of PE off Coumadin now, secondary polycythemia lives in the assisted living facility started experiencing worsening shortness of breath last evening with productive cough. Patient has been experiencing more than usual shortness of breath over the last 2 weeks. Last evening after supper when patient was walking suddenly patient became very short of breath and hypoxic. Denies any associated chest pain dizziness palpitations nausea vomiting diaphoresis. Patient was brought in the ER and placed on BiPAP and was found to be febrile and blood work shows leukocytosis. Chest x-ray does not show any definite  infiltrates. But given patient's fever productive cough and leukocytosis patient was started on antibiotics for possible pneumonia. Patient also has bruise on his abdomen. Which patient states he developed after continuous cough. Denies any abdominal pain nausea vomiting diarrhea   Hospital Course:  Acute on chronic respiratory failure secondary to COPD exacerbation, CAP, OSA, bilateral pneumonia/ consolidation  Patient was admitted to step down unit due to significant hypoxia. At baseline patient is on 8 L of O2 via nasal cannula and was desatting to low 70s on minimal exertion. Patient was placed on BiPAP overnight on the day of admission. Influenza negative, urine strep antigen remained negative . Patient was placed on IV Zithromax and Rocephin, scheduled nebulizer treatments, Tessalon Perles, Robitussin as needed . He was placed on IV Solu-Medrol, has been transitioned to oral prednisone. Patient was followed closely by pulmonology, Dr. Lamonte Sakai. Patient had CT angiogram of the chest done which showed no pulmonary embolism. CT angiogram showed airspace consolidation in both lower lobes. The patient had history of right middle lung nodule which has shrunk and unchanged for over a year, patient has appointment with Dr. Elsworth Soho for follow-up.   Tachycardia/MAT: During hospitalization patient was noted to be in and out of sinus rhythm, ?atrial fibrillation. Cardiology was consulted, patient was seen by Dr. Ellyn Hack and Dr. Radford Pax who strongly felt that it was likely MAT then A. fib. Dr. Radford Pax recommended  change beta blocker to Cardizem due to his severe emphysema and COPD. She recommended Cardizem 180 mg daily. Patient has been tolerating Cardizem very well and had no issues with arrhythmias after that. Per cardiology no need of anticoagulation at this time. Dr. Radford Pax will arrange for outpatient event monitor.  2-D echo showed EF of 45-36%, grade 1 diastolic dysfunction mild to moderate aortic stenosis.    OBSTRUCTIVE SLEEP APNEA - continue CPAP   CAP (community acquired pneumonia)  - As #1   History of PE  - D-dimer elevated, CT angiogram of the chest negative for any PE but showed airspace consolidation in both lower lobes more on the left  - Doppler ultrasound of the lower extremities negative for DVT   Hypertension: BP stable  COPD (chronic obstructive pulmonary disease)  - As #1   Acute renal insufficiency on CKD stage III resolved, Lasix was held for one day, creatinine back to baseline at 1.4  Spiculated mass: Not seen on most recent CT chest done yesterday, pulmonology following   Petechiae on his lower abdomen:  - Patient incidentally saw  it last week during shower, denied any pain in the area, states happened because of coughing spasm. Patient also reports that similar petechia/hematoma previously which resolved spontaneously. Platelets normal.   Day of Discharge BP 128/78  Pulse 74  Temp(Src) 97.7 F (36.5 C) (Oral)  Resp 18  Ht 5\' 10"  (1.778 m)  Wt 91.672 kg (202 lb 1.6 oz)  BMI 29.00 kg/m2  SpO2 97%  Physical Exam: General: Alert and awake oriented x3 not in any acute distress. CVS: S1-S2 clear no murmur rubs or gallops Chest decreased breath sounds at the bases Abdomen: Obese, soft nontender, nondistended, normal bowel sounds Extremities: no cyanosis, clubbing or edema noted bilaterally Neuro: Cranial nerves II-XII intact, no focal neurological deficits   The results of significant diagnostics from this hospitalization (including imaging, microbiology, ancillary and laboratory) are listed below for reference.    LAB RESULTS: Basic Metabolic Panel:  Recent Labs Lab 03/01/14 0433 03/02/14 0515  NA 142 140  K 4.3 5.1  CL 101 102  CO2 28 27  GLUCOSE 132* 108*  BUN 38* 44*  CREATININE 1.72* 1.47*  CALCIUM 8.6 8.6   Liver Function Tests:  Recent Labs Lab 02/26/14 2228  AST 34  ALT 30  ALKPHOS 47  BILITOT 0.5  PROT 6.7  ALBUMIN 2.7*   No  results found for this basename: LIPASE, AMYLASE,  in the last 168 hours No results found for this basename: AMMONIA,  in the last 168 hours CBC:  Recent Labs Lab 02/26/14 2228  03/01/14 0433 03/02/14 0515  WBC 19.9*  < > 19.3* 13.7*  NEUTROABS 17.1*  --   --   --   HGB 14.0  < > 12.6* 13.1  HCT 44.1  < > 39.8 41.8  MCV 87.7  < > 87.5 86.0  PLT 255  < > 265 292  < > = values in this interval not displayed. Cardiac Enzymes:  Recent Labs Lab 02/27/14 0835 02/27/14 1523  TROPONINI <0.30 <0.30   BNP: No components found with this basename: POCBNP,  CBG: No results found for this basename: GLUCAP,  in the last 168 hours  Significant Diagnostic Studies:  Ct Angio Chest Pe W/cm &/or Wo Cm  02/27/2014   CLINICAL DATA:  Difficulty breathing ; history of bladder carcinoma  EXAM: CT ANGIOGRAPHY CHEST WITH CONTRAST  TECHNIQUE: Multidetector CT imaging of the chest was performed using the standard protocol during bolus administration of intravenous contrast. Multiplanar CT image reconstructions and MIPs were obtained to evaluate the vascular anatomy.  CONTRAST:  163mL OMNIPAQUE IOHEXOL 350 MG/ML SOLN  COMPARISON:  Chest CT March 19, 2013 and chest radiograph February 26, 2014  FINDINGS: There is no demonstrable pulmonary embolus. There is atherosclerotic change in the aorta. There is no thoracic aortic aneurysm or dissection, however.  There is widespread bolus type emphysematous change. Multiple large bullae are identified throughout the lungs. There is patchy consolidation in both lower lobes, more on the left than on the right.  There are scattered subcentimeter mediastinal lymph nodes but no adenopathy by size criteria. Pericardium is not thickened. There are multiple foci of coronary artery calcification.  In the visualized upper abdomen, there is atherosclerotic change in aorta. Visualized upper abdominal structures otherwise appear unremarkable. There is degenerative change in the thoracic spine.  There is slight anterior wedging of a mid thoracic vertebral body. There are no blastic or lytic bone lesions. Visualized thyroid appears unremarkable.  Review of the MIP images confirms the above findings.  IMPRESSION:  No demonstrable pulmonary embolus. Extensive bullous emphysema. There is airspace consolidation in both lower lobes, more on the left than on the right. 1   Electronically Signed   By: Lowella Grip M.D.   On: 02/27/2014 12:12   Dg Chest Port 1 View  02/26/2014   CLINICAL DATA:  Shortness of breath.  History pulmonary embolism  EXAM: PORTABLE CHEST - 1 VIEW  COMPARISON:  02/21/2014  FINDINGS: Likely no cardiomegaly when accounting for mediastinal fat. Stable upper mediastinal contours, with hilar prominence likely vascular.  Pulmonary hyperinflation and emphysematous change. Scarring present at both bases. There is no evidence of superimposed pneumonia, edema, effusion, or pneumothorax. Note that a small volume of the left basilar lung is likely excluded from view.  IMPRESSION: Emphysema without superimposed pneumonia or edema.   Electronically Signed   By: Jorje Guild M.D.   On: 02/26/2014 22:46    2D ECHO: Study Conclusions  - Left ventricle: The cavity size was normal. Systolic function was normal. The estimated ejection fraction was in the range of 55% to 60%. Regional wall motion abnormalities cannot be excluded. Doppler parameters are consistent with abnormal left ventricular relaxation (grade 1 diastolic dysfunction). - Aortic valve: There was mild to moderate stenosis. There was mild regurgitation. Valve area (VTI): 1.21 cm^2. Valve area (Vmax): 1.32 cm^2. - Mitral valve: There was no regurgitation. - Left atrium: The atrium was mildly dilated. - Right ventricle: Systolic function was normal. - Right atrium: The atrium was normal in size. - Tricuspid valve: There was mild regurgitation. - Pulmonary arteries: Systolic pressure was moderately increased. RVSP 47  mmHg. PA peak pressure: 47 mm Hg (S). - Pericardium, extracardiac: There was no pericardial effusion.  Impressions:  - Normal biventricular size and function. Mild to moderate aortic stenosis. Moderate pulmonary hypertension.    Disposition and Follow-up:     Discharge Instructions   Diet - low sodium heart healthy    Complete by:  As directed      Increase activity slowly    Complete by:  As directed             DISPOSITION: home/ALF  DIET: Heart healthy diet   DISCHARGE FOLLOW-UP Follow-up Information   Follow up with Rigoberto Noel., MD On 03/25/2014. (2:15pm)    Specialty:  Pulmonary Disease   Contact information:   [address] [address] [phone]       Follow up with Penni Homans, MD. Schedule an appointment as soon as possible for a visit in 2 weeks. (for hospital follow-up)    Specialty:  Family Medicine   Contact information:   [place] [address] [phone]       Time spent on Discharge: 45 mins  Signed:   Mendel Corning M.D. [facility] 03/02/2014, 10:13 AM Pager: [phone]   **Disclaimer: This note was dictated with voice recognition software. Similar sounding words can inadvertently be transcribed and this note may contain transcription errors which may not have been corrected upon publication of note.**

## 2014-03-02 NOTE — Progress Notes (Signed)
SUBJECTIVE:  No compalints  OBJECTIVE:   Vitals:   Filed Vitals:   03/01/14 1546 03/01/14 2036 03/01/14 2040 03/02/14 0511  BP:   123/83 128/78  Pulse:   70 74  Temp:   98.6 F (37 C) 97.7 F (36.5 C)  TempSrc:   Oral Oral  Resp:   18 18  Height:      Weight:    202 lb 1.6 oz (91.672 kg)  SpO2: 90% 91% 90% 97%   I&O's:   Intake/Output Summary (Last 24 hours) at 03/02/14 6606 Last data filed at 03/02/14 3016  Gross per 24 hour  Intake    853 ml  Output   2155 ml  Net  -1302 ml   TELEMETRY: Reviewed telemetry pt in NSR with PAC's and PVC's:     PHYSICAL EXAM General: Well developed, well nourished, in no acute distress Head: Eyes PERRLA, No xanthomas.   Normal cephalic and atramatic  Lungs:   Clear bilaterally to auscultation and percussion. Heart:   HRRR S1 S2 Pulses are 2+ & equal. Abdomen: Bowel sounds are positive, abdomen soft and non-tender without masses  Extremities:   No clubbing, cyanosis or edema.  DP +1 Neuro: Alert and oriented X 3. Psych:  Good affect, responds appropriately   LABS: Basic Metabolic Panel:  Recent Labs  02/28/14 0310 03/01/14 0433  NA 137 142  K 4.8 4.3  CL 99 101  CO2 28 28  GLUCOSE 138* 132*  BUN 35* 38*  CREATININE 1.49* 1.72*  CALCIUM 8.6 8.6   Liver Function Tests: No results found for this basename: AST, ALT, ALKPHOS, BILITOT, PROT, ALBUMIN,  in the last 72 hours No results found for this basename: LIPASE, AMYLASE,  in the last 72 hours CBC:  Recent Labs  03/01/14 0433 03/02/14 0515  WBC 19.3* 13.7*  HGB 12.6* 13.1  HCT 39.8 41.8  MCV 87.5 86.0  PLT 265 292   Cardiac Enzymes:  Recent Labs  02/27/14 0835 02/27/14 1523  TROPONINI <0.30 <0.30   BNP: No components found with this basename: POCBNP,  D-Dimer: No results found for this basename: DDIMER,  in the last 72 hours Hemoglobin A1C: No results found for this basename: HGBA1C,  in the last 72 hours Fasting Lipid Panel: No results found for this  basename: CHOL, HDL, LDLCALC, TRIG, CHOLHDL, LDLDIRECT,  in the last 72 hours Thyroid Function Tests: No results found for this basename: TSH, T4TOTAL, FREET3, T3FREE, THYROIDAB,  in the last 72 hours Anemia Panel: No results found for this basename: VITAMINB12, FOLATE, FERRITIN, TIBC, IRON, RETICCTPCT,  in the last 72 hours Coag Panel:   Lab Results  Component Value Date   INR 1.2* 06/21/2013   INR 1.07 06/19/2013   INR 2.2 05/23/2013   PROTIME 14.4* 06/21/2013    RADIOLOGY: Dg Chest 2 View  02/21/2014   CLINICAL DATA:  Cough, former smoking history, COPD  EXAM: CHEST  2 VIEW  COMPARISON:  Chest x-ray of 10/11/2013  FINDINGS: The lungs are hyperaerated with somewhat flattened hemidiaphragms suggesting emphysema. There are prominent markings primarily at the lung bases the majority of which appear chronic. There are somewhat more prominent markings on the lateral view in the lung base posteriorly and superimposed pneumonia would be difficult to exclude. Followup chest x-ray is recommended if warranted clinically. No effusion is seen. The heart is mildly enlarged and stable. There are degenerative changes throughout the thoracic spine.  IMPRESSION: 1. Emphysema and probable basilar fibrosis. Somewhat more prominent markings  on the lateral view at the lung base could represent superimposed pneumonia and followup is recommended if warranted clinically. 2. Stable cardiomegaly.   Electronically Signed   By: Ivar Drape M.D.   On: 02/21/2014 10:17   Ct Angio Chest Pe W/cm &/or Wo Cm  02/27/2014   CLINICAL DATA:  Difficulty breathing ; history of bladder carcinoma  EXAM: CT ANGIOGRAPHY CHEST WITH CONTRAST  TECHNIQUE: Multidetector CT imaging of the chest was performed using the standard protocol during bolus administration of intravenous contrast. Multiplanar CT image reconstructions and MIPs were obtained to evaluate the vascular anatomy.  CONTRAST:  127mL OMNIPAQUE IOHEXOL 350 MG/ML SOLN  COMPARISON:  Chest  CT March 19, 2013 and chest radiograph February 26, 2014  FINDINGS: There is no demonstrable pulmonary embolus. There is atherosclerotic change in the aorta. There is no thoracic aortic aneurysm or dissection, however.  There is widespread bolus type emphysematous change. Multiple large bullae are identified throughout the lungs. There is patchy consolidation in both lower lobes, more on the left than on the right.  There are scattered subcentimeter mediastinal lymph nodes but no adenopathy by size criteria. Pericardium is not thickened. There are multiple foci of coronary artery calcification.  In the visualized upper abdomen, there is atherosclerotic change in aorta. Visualized upper abdominal structures otherwise appear unremarkable. There is degenerative change in the thoracic spine. There is slight anterior wedging of a mid thoracic vertebral body. There are no blastic or lytic bone lesions. Visualized thyroid appears unremarkable.  Review of the MIP images confirms the above findings.  IMPRESSION: No demonstrable pulmonary embolus. Extensive bullous emphysema. There is airspace consolidation in both lower lobes, more on the left than on the right. 1   Electronically Signed   By: Lowella Grip M.D.   On: 02/27/2014 12:12   Dg Chest Port 1 View  02/26/2014   CLINICAL DATA:  Shortness of breath.  History pulmonary embolism  EXAM: PORTABLE CHEST - 1 VIEW  COMPARISON:  02/21/2014  FINDINGS: Likely no cardiomegaly when accounting for mediastinal fat. Stable upper mediastinal contours, with hilar prominence likely vascular.  Pulmonary hyperinflation and emphysematous change. Scarring present at both bases. There is no evidence of superimposed pneumonia, edema, effusion, or pneumothorax. Note that a small volume of the left basilar lung is likely excluded from view.  IMPRESSION: Emphysema without superimposed pneumonia or edema.   Electronically Signed   By: Jorje Guild M.D.   On: 02/26/2014 22:46   IMPRESSION:    Principal Problem:  Dyspnea on exertion secondary to PNA  Active Problems:  Acute cor pulmonale  Multifocal atrial tachycardia  OBSTRUCTIVE SLEEP APNEA  COPD- severe  Polycythemia- secondary to COPD  HYPERLIPIDEMIA  HYPERTENSION, BORDERLINE  PE/DVT 2014- now off Coumadin, CTA negative   RECOMMENDATION:  Pleasant elderly man with significant COPD & no prior h/o CAD or cardia arrhythmia - admitted for increased dyspnea and PNA. Was apparenly improving with plan to transfer out of SDU & developed and irregular RVR rhythm that on tele is hard to determine MAT vs. Afib. 2D echo with normal LVF, mild to moderate AS and mild pulmonary HTN.  - tele reviewed this am and everything appears to be MAT, NSR with frequent PAC's and PVC's and 1 run of WCT for 5 beats that is most likely atrial tachy with abberation - MAT would go along with worsening pulmonary function; Beta blocker stopped and Cardizem added yesterday and HR much better controlled.   -- for now, would hold off  on Anticoagulation unless any definite afib is seen  - MAT is not usually an indication for anticoagulation.  - Would get an event monitor as an outpt to assess for silent afib         Sueanne Margarita, MD  03/02/2014  6:34 AM

## 2014-03-02 NOTE — Progress Notes (Signed)
Pt leaving at this time, headed to [facility] with his family at his side. (spouse and son) Discharge instructions/prescriptions given/explained with pt and family verbalizing understanding.  Pt leaving on 8-10 l/min O2 via [place]. Pt has his own O2. Followup appointments noted.

## 2014-03-03 ENCOUNTER — Other Ambulatory Visit: Payer: Self-pay | Admitting: Physician Assistant

## 2014-03-03 DIAGNOSIS — I499 Cardiac arrhythmia, unspecified: Secondary | ICD-10-CM

## 2014-03-03 NOTE — Progress Notes (Signed)
Per Dr. Theodosia Blender request, left msg on our scheduling line to arrange 4 week event monitor to exclude PAF. Have also requested f/u in our office. Ofc will call patient to arrange. Ashanta Amoroso PA-C

## 2014-03-04 ENCOUNTER — Telehealth: Payer: Self-pay | Admitting: Pulmonary Disease

## 2014-03-04 NOTE — Telephone Encounter (Signed)
Called spoke with . He needed to cancel his appt for 03/25/14 w/ RA since he is already scheduled for 03/21/14. Nothing further needed

## 2014-03-05 ENCOUNTER — Telehealth: Payer: Self-pay | Admitting: *Deleted

## 2014-03-05 LAB — CULTURE, BLOOD (ROUTINE X 2)
CULTURE: NO GROWTH
Culture: NO GROWTH

## 2014-03-05 NOTE — Telephone Encounter (Signed)
Left message for patient to call back  

## 2014-03-05 NOTE — Telephone Encounter (Signed)
Message copied by Rodman Key on Tue Mar 05, 2014 12:14 PM ------      Message from: MCVEY, Virginia K      Created: Tue Mar 05, 2014  8:05 AM      Regarding: FW: event monitor       Please see below and call pt to explain,  Dr. Theodosia Blender nurse is out all week that's why I'm sending to message nurse to call,  Thanks            ----- Message -----         From: Charlie Pitter, PA-C         Sent: 03/04/2014   8:16 PM           To: Megan Salon McVey      Subject: RE: event monitor                                        Please have nursng call the patient - per Dr. Theodosia Blender rounding note, patient had irregular rhythm on tele while in the hospital and the monitor is to exclude atrial fibrillation. Thx            ----- Message -----         From: Megan Salon McVey         Sent: 03/04/2014   3:30 PM           To: Charlie Pitter, PA-C      Subject: event monitor                                            Called pt to schedule event monitor and pt don't understand why he needs this monitor, he does not recall anyone talking to him about getting this event monitor and would like for someone to call him to explain before he schedules            thanks             ------

## 2014-03-05 NOTE — Telephone Encounter (Signed)
Called home number, no answer.

## 2014-03-06 NOTE — Telephone Encounter (Signed)
PT  RETURNED  CALL   DOES  NOT  WISH TO PROCEED  WITH  MONITOR  WANTS  TO  SEE DR CRENSHAW  IN [facility]  TO  DISCUSS   FORWARDED  CALL TO SCHEDULERS  TO  MAKE AN APPT .Adonis Housekeeper

## 2014-03-11 ENCOUNTER — Ambulatory Visit (INDEPENDENT_AMBULATORY_CARE_PROVIDER_SITE_OTHER): Payer: Medicare Other | Admitting: Physician Assistant

## 2014-03-11 ENCOUNTER — Encounter: Payer: Self-pay | Admitting: Physician Assistant

## 2014-03-11 VITALS — BP 138/68 | HR 61 | Temp 98.1°F | Resp 18 | Ht 70.0 in | Wt 201.0 lb

## 2014-03-11 DIAGNOSIS — I471 Supraventricular tachycardia: Secondary | ICD-10-CM

## 2014-03-11 DIAGNOSIS — I498 Other specified cardiac arrhythmias: Secondary | ICD-10-CM

## 2014-03-11 DIAGNOSIS — J189 Pneumonia, unspecified organism: Secondary | ICD-10-CM

## 2014-03-11 LAB — CBC
HCT: 43.1 % (ref 39.0–52.0)
Hemoglobin: 14.3 g/dL (ref 13.0–17.0)
MCH: 27.8 pg (ref 26.0–34.0)
MCHC: 33.2 g/dL (ref 30.0–36.0)
MCV: 83.7 fL (ref 78.0–100.0)
Platelets: 334 10*3/uL (ref 150–400)
RBC: 5.15 MIL/uL (ref 4.22–5.81)
RDW: 16.5 % — AB (ref 11.5–15.5)
WBC: 14.9 10*3/uL — ABNORMAL HIGH (ref 4.0–10.5)

## 2014-03-11 LAB — COMPREHENSIVE METABOLIC PANEL
ALT: 36 U/L (ref 0–53)
AST: 29 U/L (ref 0–37)
Albumin: 3.7 g/dL (ref 3.5–5.2)
Alkaline Phosphatase: 45 U/L (ref 39–117)
BUN: 30 mg/dL — ABNORMAL HIGH (ref 6–23)
CO2: 30 mEq/L (ref 19–32)
CREATININE: 1.31 mg/dL (ref 0.50–1.35)
Calcium: 9.1 mg/dL (ref 8.4–10.5)
Chloride: 99 mEq/L (ref 96–112)
Glucose, Bld: 93 mg/dL (ref 70–99)
Potassium: 4.5 mEq/L (ref 3.5–5.3)
Sodium: 139 mEq/L (ref 135–145)
Total Bilirubin: 0.4 mg/dL (ref 0.2–1.2)
Total Protein: 6.2 g/dL (ref 6.0–8.3)

## 2014-03-11 NOTE — Patient Instructions (Signed)
Please finish prednisone taper. Continue medications as directed.  I will call you with your lab results. Please follow-up with Dr. Elsworth Soho as scheduled.  Call or return to clinic if any new symptoms develop.  WEAR YOUR COMPRESSION STOCKINGS!

## 2014-03-11 NOTE — Progress Notes (Signed)
Patient presents to clinic today for hospital follow-up.  Patient was admitted to the hospital for a 4-day stay on 02/26/14 for CAP and acute on chronic respiratory failure.     Hospital Course: (Taken from discharge summary) Acute on chronic respiratory failure secondary to COPD exacerbation, CAP, OSA, bilateral pneumonia/ consolidation   Patient was admitted to step down unit due to significant hypoxia. At baseline patient is on 8 L of O2 via nasal cannula and was desatting to low 70s on minimal exertion. Patient was placed on BiPAP overnight on the day of admission. Influenza negative, urine strep antigen remained negative . Patient was placed on IV Zithromax and Rocephin, scheduled nebulizer treatments, Tessalon Perles, Robitussin as needed . He was placed on IV Solu-Medrol, has been transitioned to oral prednisone. Patient was followed closely by pulmonology, Dr. Lamonte Sakai. Patient had CT angiogram of the chest done which showed no pulmonary embolism. CT angiogram showed airspace consolidation in both lower lobes.  The patient had history of right middle lung nodule which has shrunk and unchanged for over a year, patient has appointment with Dr. Elsworth Soho for follow-up.   Tachycardia/MAT: During hospitalization patient was noted to be in and out of sinus rhythm, ?atrial fibrillation. Cardiology was consulted, patient was seen by Dr. Ellyn Hack and Dr. Radford Pax who strongly felt that it was likely MAT then A. fib. Dr. Radford Pax recommended change beta blocker to Cardizem due to his severe emphysema and COPD. She recommended Cardizem 180 mg daily. Patient has been tolerating Cardizem very well and had no issues with arrhythmias after that. Per cardiology no need of anticoagulation at this time. Dr. Radford Pax will arrange for outpatient event monitor.  2-D echo showed EF of 31-49%, grade 1 diastolic dysfunction mild to moderate aortic stenosis.  OBSTRUCTIVE SLEEP APNEA - continue CPAP  CAP (community acquired pneumonia)  -  As #1  History of PE  - D-dimer elevated, CT angiogram of the chest negative for any PE but showed airspace consolidation in both lower lobes more on the left  - Doppler ultrasound of the lower extremities negative for DVT  Hypertension: BP stable  COPD (chronic obstructive pulmonary disease)  - As #1  Acute renal insufficiency on CKD stage III resolved, Lasix was held for one day, creatinine back to baseline at 1.4  Spiculated mass: Not seen on most recent CT chest done yesterday, pulmonology following   Patient endorses doing very well since discharge.  Has completed antibiotic and steroid taper.  Endorses good O2 saturation on 3-5L O2 via nasal cannula. Denies increased cough of SOB.  Denies fever, chills, wheezing or pleuritic chest pain.  Has upcoming appointment with Dr. Elsworth Soho.   Past Medical History  Diagnosis Date  . Chronic airway obstruction, not elsewhere classified   . Unspecified essential hypertension   . Cerebrovascular disease, unspecified   . Other and unspecified hyperlipidemia   . Diaphragmatic hernia without mention of obstruction or gangrene   . Diverticulosis of colon (without mention of hemorrhage)   . Benign neoplasm of colon   . Overweight(278.02)   . On home oxygen therapy   . H/O tobacco use, presenting hazards to health 01/25/2013  . Edema 04/18/2013  . OSA on CPAP   . Peripheral vascular disease     hx of DVT stopped Coumadin 06/05/2013   . Cancer     kidney cancer   . Arthritis   . Polycythemia   . DVT (deep venous thrombosis)     2014   . Pulmonary  embolism   . 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 Renal insufficiency 12/23/2013    Current Outpatient Prescriptions on File Prior to Visit  Medication Sig Dispense Refill  . acetaminophen (TYLENOL ARTHRITIS PAIN) 650 MG CR tablet Take 650 mg by mouth every morning.       Marland Kitchen ADVAIR DISKUS 250-50 MCG/DOSE AEPB USE ONE INHALATION TWICE A DAY.  60 each  3  . albuterol (PROVENTIL) (2.5 MG/3ML) 0.083% nebulizer  solution Take 3 mLs (2.5 mg total) by nebulization 4 (four) times daily. And every 4 hours as needed for SOB/wheezing  360 mL  12  . aspirin 81 MG tablet Take 81 mg by mouth 2 (two) times daily.       . benzonatate (TESSALON) 100 MG capsule Take 1 capsule (100 mg total) by mouth 3 (three) times daily.  60 capsule  0  . diltiazem (CARDIZEM CD) 180 MG 24 hr capsule Take 1 capsule (180 mg total) by mouth daily.  90 capsule  3  . furosemide (LASIX) 40 MG tablet Take 20-40 mg by mouth 2 (two) times daily. 40 mg in the morning and 20 mg in the evening      . guaiFENesin (MUCINEX) 600 MG 12 hr tablet Take 1,200 mg by mouth 2 (two) times daily.       Marland Kitchen guaiFENesin-codeine 100-10 MG/5ML syrup Take 5 mLs by mouth every 6 (six) hours as needed for cough.  120 mL  0  . Omega-3 Fatty Acids (FISH OIL) 1200 MG CAPS Take 1,200 mg by mouth every morning.      . psyllium (METAMUCIL) 58.6 % packet Take 1 packet by mouth daily.      . simvastatin (ZOCOR) 40 MG tablet TAKE ONE (1) TABLET EACH DAY  30 tablet  3  . SPIRIVA HANDIHALER 18 MCG inhalation capsule INHALE THE CONTENTS OF ONE CAPSULE DAILY  30 capsule  3   No current facility-administered medications on file prior to visit.    Allergies  Allergen Reactions  . Sulfamethoxazole Other (See Comments)    REACTION: "washes" him out per pt    Family History  Problem Relation Age of Onset  . Cancer Mother     colon with mets  . COPD Father   . Hypertension Father   . Arthritis Sister   . Other Son     h/o cystitis    History   Social History  . Marital Status: Married    Spouse Name: Zelphia Cairo    Number of Children: 3  . Years of Education: N/A   Occupational History  .     Social History Main Topics  . Smoking status: Former Smoker -- 1.50 packs/day for 36 years    Types: Cigarettes    Start date: 09/09/1945    Quit date: 09/27/1978  . Smokeless tobacco: Never Used     Comment: quit 34 years ago  . Alcohol Use: No  . Drug Use: No  .  Sexual Activity: No   Other Topics Concern  . None   Social History Narrative  . None   Review of Systems - See HPI.  All other ROS are negative.  BP 138/68  Pulse 61  Temp(Src) 98.1 F (36.7 C) (Oral)  Resp 18  Ht _0  (1.778 m)  Wt 201 lb (91.173 kg)  BMI 28.84 kg/m2  SpO2 93%  Physical Exam  Vitals reviewed. Constitutional: He is oriented to person, place, and time and well-developed, well-nourished, and in no distress.  HENT:  Head: Normocephalic and atraumatic.  Nose: Nose normal.  Mouth/Throat: Oropharynx is clear and moist. No oropharyngeal exudate.  Eyes: Conjunctivae and EOM are normal. Pupils are equal, round, and reactive to light.  Neck: Neck supple.  Cardiovascular: Normal rate, regular rhythm, normal heart sounds and intact distal pulses.   Pulmonary/Chest: Effort normal and breath sounds normal. No respiratory distress. He has no wheezes. He has no rales. He exhibits no tenderness.  Neurological: He is alert and oriented to person, place, and time.  Skin: Skin is warm and dry. No rash noted.  Psychiatric: Affect normal.   Recent Results (from the past 2160 hour(s))  CBC WITH DIFFERENTIAL ([facility])     Status: Abnormal   Collection Time    01/08/14  9:39 AM      Result Value Ref Range   WBC 8.8  4.0 - 10.0 10e3/uL   RBC 4.81  4.20 - 5.70 10e6/uL   HGB 13.8  13.0 - 17.1 g/dL   HCT 43.5  38.7 - 49.9 %   MCV 90  82 - 98 fL   MCH 28.7  28.0 - 33.4 pg   MCHC 31.7 (*) 32.0 - 35.9 g/dL   RDW 15.1  11.1 - 15.7 %   Platelets 231  145 - 400 10e3/uL   NEUT# 6.0  1.5 - 6.5 10e3/uL   LYMPH# 1.2  0.9 - 3.3 10e3/uL   MONO# 1.2 (*) 0.1 - 0.9 10e3/uL   Eosinophils Absolute 0.4  0.0 - 0.5 10e3/uL   BASO# 0.0  0.0 - 0.2 10e3/uL   NEUT% 68.0  40.0 - 80.0 %   LYMPH% 13.8 (*) 14.0 - 48.0 %   MONO% 13.5 (*) 0.0 - 13.0 %   EOS% 4.4  0.0 - 7.0 %   BASO% 0.3  0.0 - 2.0 %  BLOOD GAS, VENOUS     Status: Abnormal   Collection Time    02/26/14 10:15 PM       Result Value Ref Range   O2 Content 10.0     Delivery systems BILEVEL POSITIVE AIRWAY PRESSURE     Mode  AUTO TITRATION IMAX 20 EMIN 8     pH, Ven 7.362 (*) 7.250 - 7.300   pCO2, Ven 53.8 (*) 45.0 - 50.0 mmHg   pO2, Ven  VALUE BELOW REPORTABLE RANGE. RBV  30.0 - 45.0 mmHg   Comment:  COURTNEY HOR      VALUE BELOW REPORTABLE RANGE. RBV      Thayer Jew, MD AT [place], RRT, RCP ON 02/26/14.   Bicarbonate 29.2 (*) 20.0 - 24.0 mEq/L   TCO2 26.2  0 - 100 mmol/L   Acid-Base Excess 3.5 (*) 0.0 - 2.0 mmol/L   O2 Saturation 35.0     Patient temperature 101.0     Collection site VEIN     Drawn by [phone]     Sample type VEIN    CULTURE, BLOOD (ROUTINE X 2)     Status: None   Collection Time    02/26/14 10:28 PM      Result Value Ref Range   Specimen Description BLOOD LEFT ANTECUBITAL     Special Requests BOTTLES DRAWN AEROBIC AND ANAEROBIC 5CC     Culture  Setup Time       Value: 02/27/2014 01:08     Performed at [facility]   Culture       Value: NO GROWTH 5 DAYS     Performed at [facility]   Report Status 03/05/2014 FINAL    CBC WITH DIFFERENTIAL     Status: Abnormal   Collection Time    02/26/14 10:28 PM      Result Value Ref Range   WBC 19.9 (*) 4.0 - 10.5 K/uL   RBC 5.03  4.22 - 5.81 MIL/uL   Hemoglobin 14.0  13.0 - 17.0 g/dL   HCT 44.1  39.0 - 52.0 %   MCV 87.7  78.0 - 100.0 fL   MCH 27.8  26.0 - 34.0 pg   MCHC 31.7  30.0 - 36.0 g/dL   RDW 16.1 (*) 11.5 - 15.5 %   Platelets 255  150 - 400 K/uL   Neutrophils Relative % 86 (*) 43 - 77 %   Neutro Abs 17.1 (*) 1.7 - 7.7 K/uL   Lymphocytes Relative 5 (*) 12 - 46 %   Lymphs Abs 1.0  0.7 - 4.0 K/uL   Monocytes Relative 7  3 - 12 %   Monocytes Absolute 1.4 (*) 0.1 - 1.0 K/uL   Eosinophils Relative 2  0 - 5 %   Eosinophils Absolute 0.4  0.0 - 0.7 K/uL   Basophils Relative 0  0 - 1 %   Basophils Absolute 0.1  0.0 - 0.1 K/uL  COMPREHENSIVE METABOLIC PANEL     Status: Abnormal   Collection Time     02/26/14 10:28 PM      Result Value Ref Range   Sodium 135 (*) 137 - 147 mEq/L   Potassium 5.4 (*) 3.7 - 5.3 mEq/L   Comment: MODERATE HEMOLYSIS     HEMOLYSIS AT THIS LEVEL MAY AFFECT RESULT   Chloride 97  96 - 112 mEq/L   CO2 28  19 - 32 mEq/L   Glucose, Bld 106 (*) 70 - 99 mg/dL   BUN 30 (*) 6 - 23 mg/dL   Creatinine, Ser 1.40 (*) 0.50 - 1.35 mg/dL   Calcium 8.6  8.4 - 10.5 mg/dL   Total Protein 6.7  6.0 - 8.3 g/dL   Albumin 2.7 (*) 3.5 - 5.2 g/dL   AST 34  0 - 37 U/L   Comment: MODERATE HEMOLYSIS     HEMOLYSIS AT THIS LEVEL MAY AFFECT RESULT   ALT 30  0 - 53 U/L   Comment: MODERATE HEMOLYSIS     HEMOLYSIS AT THIS LEVEL MAY AFFECT RESULT   Alkaline Phosphatase 47  39 - 117 U/L   Total Bilirubin 0.5  0.3 - 1.2 mg/dL   GFR calc non Af Amer 44 (*) >90 mL/min   GFR calc Af Amer 51 (*) >90 mL/min   Comment: (NOTE)     The eGFR has been calculated using the CKD EPI equation.     This calculation has not been validated in all clinical situations.     eGFR's persistently <90 mL/min signify possible Chronic Kidney     Disease.  URINALYSIS, ROUTINE W REFLEX MICROSCOPIC     Status: Abnormal   Collection Time    02/26/14 10:39 PM      Result Value Ref Range   Color, Urine YELLOW  YELLOW   APPearance CLEAR  CLEAR   Specific Gravity, Urine 1.018  1.005 - 1.030   pH 6.0  5.0 - 8.0   Glucose, UA NEGATIVE  NEGATIVE mg/dL   Hgb urine dipstick NEGATIVE  NEGATIVE   Bilirubin Urine NEGATIVE  NEGATIVE   Ketones, ur NEGATIVE  NEGATIVE mg/dL   Protein, ur 30 (*) NEGATIVE mg/dL  Urobilinogen, UA 1.0  0.0 - 1.0 mg/dL   Nitrite NEGATIVE  NEGATIVE   Leukocytes, UA NEGATIVE  NEGATIVE  URINE CULTURE     Status: None   Collection Time    02/26/14 10:39 PM      Result Value Ref Range   Specimen Description URINE, RANDOM     Special Requests NONE     Culture  Setup Time       Value: 02/27/2014 02:02     Performed at [facility]       Value: NO GROWTH      Performed at [facility]   Culture       Value: NO GROWTH     Performed at [facility]   Report Status 02/28/2014 FINAL    URINE MICROSCOPIC-ADD ON     Status: None   Collection Time    02/26/14 10:39 PM      Result Value Ref Range   Squamous Epithelial / LPF RARE  RARE  LEGIONELLA ANTIGEN, URINE     Status: None   Collection Time    02/26/14 10:39 PM      Result Value Ref Range   Specimen Description URINE, CLEAN CATCH     Special Requests NONE     Legionella Antigen, Urine       Value: Negative for Legionella pneumophilia serogroup 1     Performed at [facility]   Report Status 02/27/2014 FINAL    STREP PNEUMONIAE URINARY ANTIGEN     Status: None   Collection Time    02/26/14 10:39 PM      Result Value Ref Range   Strep Pneumo Urinary Antigen NEGATIVE  NEGATIVE   Comment:            Infection due to S. pneumoniae     cannot be absolutely ruled out     since the antigen present     may be below the detection limit     of the test.     Performed at [place], BLOOD (ROUTINE X 2)     Status: None   Collection Time    02/26/14 10:45 PM      Result Value Ref Range   Specimen Description BLOOD RIGHT ANTECUBITAL     Special Requests BOTTLES DRAWN AEROBIC AND ANAEROBIC 5CC     Culture  Setup Time       Value: 02/27/2014 01:08     Performed at [facility]   Culture       Value: NO GROWTH 5 DAYS     Performed at [facility]   Report Status 03/05/2014 FINAL    I-STAT CG4 LACTIC ACID, ED     Status: None   Collection Time    02/26/14 10:54 PM      Result Value Ref Range   Lactic Acid, Venous 1.32  0.5 - 2.2 mmol/L  MRSA PCR SCREENING     Status: None   Collection Time    02/27/14  1:25 AM      Result Value Ref Range   MRSA by PCR NEGATIVE  NEGATIVE   Comment:            The GeneXpert MRSA Assay (FDA     approved for NASAL specimens     only), is one component of a     comprehensive MRSA colonization      surveillance program. It is not  intended to diagnose MRSA     infection nor to guide or     monitor treatment for     MRSA infections.  TROPONIN I     Status: None   Collection Time    02/27/14  2:58 AM      Result Value Ref Range   Troponin I <0.30  <0.30 ng/mL   Comment:            Due to the release kinetics of cTnI,     a negative result within the first hours     of the onset of symptoms does not rule out     myocardial infarction with certainty.     If myocardial infarction is still suspected,     repeat the test at appropriate intervals.  D-DIMER, QUANTITATIVE     Status: Abnormal   Collection Time    02/27/14  2:58 AM      Result Value Ref Range   D-Dimer, Quant 1.62 (*) 0.00 - 0.48 ug/mL-FEU   Comment:            AT THE INHOUSE ESTABLISHED CUTOFF     VALUE OF 0.48 ug/mL FEU,     THIS ASSAY HAS BEEN DOCUMENTED     IN THE LITERATURE TO HAVE     A SENSITIVITY AND NEGATIVE     PREDICTIVE VALUE OF AT LEAST     98 TO 99%.  THE TEST RESULT     SHOULD BE CORRELATED WITH     AN ASSESSMENT OF THE CLINICAL     PROBABILITY OF DVT / VTE.  TSH     Status: None   Collection Time    02/27/14  2:58 AM      Result Value Ref Range   TSH 1.240  0.350 - 4.500 uIU/mL   Comment: Performed at [facility] PANEL     Status: Abnormal   Collection Time    02/27/14  2:58 AM      Result Value Ref Range   Sodium 136 (*) 137 - 147 mEq/L   Potassium 5.1  3.7 - 5.3 mEq/L   Chloride 98  96 - 112 mEq/L   CO2 26  19 - 32 mEq/L   Glucose, Bld 143 (*) 70 - 99 mg/dL   BUN 30 (*) 6 - 23 mg/dL   Creatinine, Ser 1.45 (*) 0.50 - 1.35 mg/dL   Calcium 8.6  8.4 - 10.5 mg/dL   GFR calc non Af Amer 42 (*) >90 mL/min   GFR calc Af Amer 49 (*) >90 mL/min   Comment: (NOTE)     The eGFR has been calculated using the CKD EPI equation.     This calculation has not been validated in all clinical situations.     eGFR's persistently <90 mL/min signify possible Chronic Kidney      Disease.  CBC     Status: Abnormal   Collection Time    02/27/14  2:58 AM      Result Value Ref Range   WBC 19.2 (*) 4.0 - 10.5 K/uL   RBC 5.09  4.22 - 5.81 MIL/uL   Hemoglobin 14.2  13.0 - 17.0 g/dL   HCT 44.2  39.0 - 52.0 %   MCV 86.8  78.0 - 100.0 fL   MCH 27.9  26.0 - 34.0 pg   MCHC 32.1  30.0 - 36.0 g/dL   RDW 15.9 (*) 11.5 - 15.5 %   Platelets 222  150 - 400 K/uL  INFLUENZA PANEL BY PCR (TYPE A & B, H1N1)     Status: None   Collection Time    02/27/14  6:31 AM      Result Value Ref Range   Influenza A By PCR NEGATIVE  NEGATIVE   Influenza B By PCR NEGATIVE  NEGATIVE   H1N1 flu by pcr NOT DETECTED  NOT DETECTED   Comment:            The Xpert Flu assay (FDA approved for     nasal aspirates or washes and     nasopharyngeal swab specimens), is     intended as an aid in the diagnosis of     influenza and should not be used as     a sole basis for treatment.     Performed at [facility]  TROPONIN I     Status: None   Collection Time    02/27/14  8:35 AM      Result Value Ref Range   Troponin I <0.30  <0.30 ng/mL   Comment:            Due to the release kinetics of cTnI,     a negative result within the first hours     of the onset of symptoms does not rule out     myocardial infarction with certainty.     If myocardial infarction is still suspected,     repeat the test at appropriate intervals.  TROPONIN I     Status: None   Collection Time    02/27/14  3:23 PM      Result Value Ref Range   Troponin I <0.30  <0.30 ng/mL   Comment:            Due to the release kinetics of cTnI,     a negative result within the first hours     of the onset of symptoms does not rule out     myocardial infarction with certainty.     If myocardial infarction is still suspected,     repeat the test at appropriate intervals.  CBC     Status: Abnormal   Collection Time    02/28/14  3:10 AM      Result Value Ref Range   WBC 19.3 (*) 4.0 - 10.5 K/uL   RBC 4.51  4.22 - 5.81  MIL/uL   Hemoglobin 12.4 (*) 13.0 - 17.0 g/dL   HCT 39.0  39.0 - 52.0 %   MCV 86.5  78.0 - 100.0 fL   MCH 27.5  26.0 - 34.0 pg   MCHC 31.8  30.0 - 36.0 g/dL   RDW 15.9 (*) 11.5 - 15.5 %   Platelets 221  150 - 400 K/uL  BASIC METABOLIC PANEL     Status: Abnormal   Collection Time    02/28/14  3:10 AM      Result Value Ref Range   Sodium 137  137 - 147 mEq/L   Potassium 4.8  3.7 - 5.3 mEq/L   Chloride 99  96 - 112 mEq/L   CO2 28  19 - 32 mEq/L   Glucose, Bld 138 (*) 70 - 99 mg/dL   BUN 35 (*) 6 - 23 mg/dL   Creatinine, Ser 1.49 (*) 0.50 - 1.35 mg/dL   Calcium 8.6  8.4 - 10.5 mg/dL   GFR calc non Af Amer 41 (*) >90 mL/min   GFR calc Af Amer 48 (*) >  90 mL/min   Comment: (NOTE)     The eGFR has been calculated using the CKD EPI equation.     This calculation has not been validated in all clinical situations.     eGFR's persistently <90 mL/min signify possible Chronic Kidney     Disease.  CBC     Status: Abnormal   Collection Time    03/01/14  4:33 AM      Result Value Ref Range   WBC 19.3 (*) 4.0 - 10.5 K/uL   RBC 4.55  4.22 - 5.81 MIL/uL   Hemoglobin 12.6 (*) 13.0 - 17.0 g/dL   HCT 39.8  39.0 - 52.0 %   MCV 87.5  78.0 - 100.0 fL   MCH 27.7  26.0 - 34.0 pg   MCHC 31.7  30.0 - 36.0 g/dL   RDW 15.9 (*) 11.5 - 15.5 %   Platelets 265  150 - 400 K/uL  BASIC METABOLIC PANEL     Status: Abnormal   Collection Time    03/01/14  4:33 AM      Result Value Ref Range   Sodium 142  137 - 147 mEq/L   Potassium 4.3  3.7 - 5.3 mEq/L   Chloride 101  96 - 112 mEq/L   CO2 28  19 - 32 mEq/L   Glucose, Bld 132 (*) 70 - 99 mg/dL   BUN 38 (*) 6 - 23 mg/dL   Creatinine, Ser 1.72 (*) 0.50 - 1.35 mg/dL   Calcium 8.6  8.4 - 10.5 mg/dL   GFR calc non Af Amer 34 (*) >90 mL/min   GFR calc Af Amer 40 (*) >90 mL/min   Comment: (NOTE)     The eGFR has been calculated using the CKD EPI equation.     This calculation has not been validated in all clinical situations.     eGFR's persistently <90  mL/min signify possible Chronic Kidney     Disease.  CBC     Status: Abnormal   Collection Time    03/02/14  5:15 AM      Result Value Ref Range   WBC 13.7 (*) 4.0 - 10.5 K/uL   RBC 4.86  4.22 - 5.81 MIL/uL   Hemoglobin 13.1  13.0 - 17.0 g/dL   HCT 41.8  39.0 - 52.0 %   MCV 86.0  78.0 - 100.0 fL   MCH 27.0  26.0 - 34.0 pg   MCHC 31.3  30.0 - 36.0 g/dL   RDW 16.1 (*) 11.5 - 15.5 %   Platelets 292  150 - 400 K/uL  BASIC METABOLIC PANEL     Status: Abnormal   Collection Time    03/02/14  5:15 AM      Result Value Ref Range   Sodium 140  137 - 147 mEq/L   Potassium 5.1  3.7 - 5.3 mEq/L   Chloride 102  96 - 112 mEq/L   CO2 27  19 - 32 mEq/L   Glucose, Bld 108 (*) 70 - 99 mg/dL   BUN 44 (*) 6 - 23 mg/dL   Creatinine, Ser 1.47 (*) 0.50 - 1.35 mg/dL   Calcium 8.6  8.4 - 10.5 mg/dL   GFR calc non Af Amer 42 (*) >90 mL/min   GFR calc Af Amer 48 (*) >90 mL/min   Comment: (NOTE)     The eGFR has been calculated using the CKD EPI equation.     This calculation has not been validated in all clinical  situations.     eGFR's persistently <90 mL/min signify possible Chronic Kidney     Disease.  CBC     Status: Abnormal   Collection Time    03/11/14 10:30 AM      Result Value Ref Range   WBC 14.9 (*) 4.0 - 10.5 K/uL   RBC 5.15  4.22 - 5.81 MIL/uL   Hemoglobin 14.3  13.0 - 17.0 g/dL   HCT 43.1  39.0 - 52.0 %   MCV 83.7  78.0 - 100.0 fL   MCH 27.8  26.0 - 34.0 pg   MCHC 33.2  30.0 - 36.0 g/dL   RDW 16.5 (*) 11.5 - 15.5 %   Platelets 334  150 - 400 K/uL  COMPREHENSIVE METABOLIC PANEL     Status: Abnormal   Collection Time    03/11/14 10:30 AM      Result Value Ref Range   Sodium 139  135 - 145 mEq/L   Potassium 4.5  3.5 - 5.3 mEq/L   Chloride 99  96 - 112 mEq/L   CO2 30  19 - 32 mEq/L   Glucose, Bld 93  70 - 99 mg/dL   BUN 30 (*) 6 - 23 mg/dL   Creat 1.31  0.50 - 1.35 mg/dL   Total Bilirubin 0.4  0.2 - 1.2 mg/dL   Alkaline Phosphatase 45  39 - 117 U/L   AST 29  0 - 37 U/L   ALT  36  0 - 53 U/L   Total Protein 6.2  6.0 - 8.3 g/dL   Albumin 3.7  3.5 - 5.2 g/dL   Calcium 9.1  8.4 - 10.5 mg/dL    Assessment/Plan: CAP (community acquired pneumonia) Symptoms improved.  Patient afebrile. O2 stable. Will obtain CBC, CMP.  Continue COPD medications.  Follow-up with Pulmonologist as scheduled.  Multifocal atrial tachycardia Endorses taking Cardizem as directed.  Tachycardia resolved in hospital after medication change. Cardiology is setting up Holter monitor.  Cardiology has decided to hold off on anticoagulant for the time being.

## 2014-03-11 NOTE — Progress Notes (Signed)
Pre visit review using our clinic review tool, if applicable. No additional management support is needed unless otherwise documented below in the visit note/SLS  

## 2014-03-13 ENCOUNTER — Telehealth: Payer: Self-pay | Admitting: *Deleted

## 2014-03-13 DIAGNOSIS — J189 Pneumonia, unspecified organism: Secondary | ICD-10-CM

## 2014-03-13 NOTE — Telephone Encounter (Signed)
Message copied by Rockwell Germany on Wed Mar 13, 2014  4:54 PM ------      Message from: Raiford Noble      Created: Tue Mar 12, 2014  4:34 AM       Labs stable -- renal function improving.  WBC count has risen some but the patient has been feeling better.  I recommend he come back in 1 week for repeat CBC.  Return sooner if any URI symptoms recur. Follow-up with Dr. Elsworth Soho as scheduled. ------

## 2014-03-13 NOTE — Telephone Encounter (Signed)
Patient informed, understood & agreed; repeat lab order placed/SLS

## 2014-03-17 NOTE — Assessment & Plan Note (Signed)
Symptoms improved.  Patient afebrile. O2 stable. Will obtain CBC, CMP.  Continue COPD medications.  Follow-up with Pulmonologist as scheduled.

## 2014-03-17 NOTE — Assessment & Plan Note (Signed)
Endorses taking Cardizem as directed.  Tachycardia resolved in hospital after medication change. Cardiology is setting up Holter monitor.  Cardiology has decided to hold off on anticoagulant for the time being.

## 2014-03-19 LAB — CBC WITH DIFFERENTIAL/PLATELET
Basophils Absolute: 0 10*3/uL (ref 0.0–0.1)
Basophils Relative: 0 % (ref 0–1)
EOS ABS: 0.2 10*3/uL (ref 0.0–0.7)
Eosinophils Relative: 2 % (ref 0–5)
HEMATOCRIT: 41.1 % (ref 39.0–52.0)
HEMOGLOBIN: 13.5 g/dL (ref 13.0–17.0)
Lymphocytes Relative: 10 % — ABNORMAL LOW (ref 12–46)
Lymphs Abs: 1.1 10*3/uL (ref 0.7–4.0)
MCH: 27.7 pg (ref 26.0–34.0)
MCHC: 32.8 g/dL (ref 30.0–36.0)
MCV: 84.2 fL (ref 78.0–100.0)
MONO ABS: 1.1 10*3/uL — AB (ref 0.1–1.0)
MONOS PCT: 10 % (ref 3–12)
NEUTROS ABS: 8.9 10*3/uL — AB (ref 1.7–7.7)
Neutrophils Relative %: 78 % — ABNORMAL HIGH (ref 43–77)
Platelets: 177 10*3/uL (ref 150–400)
RBC: 4.88 MIL/uL (ref 4.22–5.81)
RDW: 17.4 % — ABNORMAL HIGH (ref 11.5–15.5)
WBC: 11.4 10*3/uL — ABNORMAL HIGH (ref 4.0–10.5)

## 2014-03-21 ENCOUNTER — Telehealth: Payer: Self-pay

## 2014-03-21 ENCOUNTER — Ambulatory Visit (INDEPENDENT_AMBULATORY_CARE_PROVIDER_SITE_OTHER): Payer: Medicare Other | Admitting: Pulmonary Disease

## 2014-03-21 ENCOUNTER — Encounter: Payer: Self-pay | Admitting: Pulmonary Disease

## 2014-03-21 VITALS — BP 134/80 | HR 96 | Ht 70.0 in | Wt 207.0 lb

## 2014-03-21 DIAGNOSIS — J4489 Other specified chronic obstructive pulmonary disease: Secondary | ICD-10-CM

## 2014-03-21 DIAGNOSIS — J9601 Acute respiratory failure with hypoxia: Secondary | ICD-10-CM

## 2014-03-21 DIAGNOSIS — J9600 Acute respiratory failure, unspecified whether with hypoxia or hypercapnia: Secondary | ICD-10-CM

## 2014-03-21 DIAGNOSIS — J449 Chronic obstructive pulmonary disease, unspecified: Secondary | ICD-10-CM

## 2014-03-21 NOTE — Assessment & Plan Note (Signed)
No clear reason for increasing hypoxia apparent ? Recent LLL pna

## 2014-03-21 NOTE — Progress Notes (Signed)
   Subjective:    Patient ID: Tracy [name], male    DOB: 1926/02/18, 80 y.o.   MRN: [id number]  HPI  PCP- [place]   80 y/o WM for FU of OSA; COPD, severe hypoxia   H/o sub segmental PE/ RLE DVT in dec 13  Sees Dr. Marin Olp for polycythemia with phlebotomy.  Significant tests/ events    OBSTRUCTIVE SLEEP APNEA - ~ sleep study 3/06 w/ RDI 26 w/ desat to 76%...  COPD - on home O2 .  ~ PFT 11/04 showed FVC=4.51 (103%), FEV1=1.93 (67%) and DLCO~50%...  ~ PFT 4/10 showed FVC= 3.00 (77%), FEV1= 1.37 (45%)  PFTs -12/13- fev1 52% (1.30 ) -unchanged from last  DLCO 29%  ~ CT Chest 9/05 & CT abd 1/12 showed marked emphysema w/ blebs, & thor spondylosis...    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 Karsten Ro.     03/21/2014  Chief Complaint  Patient presents with  . Hospitalization Follow-up    Pt was admitted at [facility] for SOB.  Pt c/o SOB with movement, worse than last week per pt.  Denies cough, congestion.     Adm 6/2- 03/02/14, CXR neg but CT s/o LLL consolidation Influenza negative, urine strep antigen remained negative . Patient was placed on IV Zithromax and Rocephin, CXR no infx MAT vs A fibn -CT angio neg PE, duplex neg DVT required transient bipap change beta blocker to Cardizem due to his severe emphysema and COPD. 2-D echo showed EF of 09-62%, grade 1 diastolic dysfunction mild to moderate aortic stenosis.  Felt better x 1 week post dc , now dyspneic again says wt is up a few lbs and ankles  swollen.  Now on lasix 40mg  in am and 20mg  in pm  No orthopnea or chest pain.  No fever, hemoptysis or n/v/d  Good appetite.  O2 demands about the same. 5-6 L at rest and 8-10 walking.      Review of Systems neg for any significant sore throat, dysphagia, itching, sneezing, nasal congestion or excess/ purulent secretions, fever, chills, sweats, unintended wt loss, pleuritic or exertional cp, hempoptysis, orthopnea pnd or change in chronic leg swelling. Also denies presyncope, palpitations, heartburn, abdominal pain, nausea, vomiting, diarrhea or change in bowel or urinary habits, dysuria,hematuria, rash, arthralgias, visual complaints, headache, numbness weakness or ataxia.     Objective:   Physical Exam  Gen. Pleasant, well-nourished, in no distress ENT - no lesions, no post nasal drip Neck: No JVD, no thyromegaly, no carotid bruits Lungs: no use of accessory muscles, no dullness to percussion, clear without rales or rhonchi  Cardiovascular: Rhythm regular, heart sounds  normal, no murmurs or gallops, no peripheral edema Musculoskeletal: No deformities, no cyanosis or clubbing        Assessment & Plan:

## 2014-03-21 NOTE — Patient Instructions (Signed)
Copy of blood tests Increase lasix to 40 mg twice daily x 5 days , then back to your usual dose of 40 am/ 20 pm After DULERA is done, get back on advair twice daily Increase nebs to thrice daily Call if worse

## 2014-03-21 NOTE — Assessment & Plan Note (Signed)
Copy of blood tests Increase lasix to 40 mg twice daily x 5 days , then back to your usual dose of 40 am/ 20 pm After DULERA is done, get back on advair twice daily Increase nebs to thrice daily Call if worse

## 2014-03-21 NOTE — Telephone Encounter (Signed)
Spoke with pt's wife to inform her that pt had left his Dulera inhaler in the exam room when they left their appt today.  This is being placed up front for them to pick up at their earliest convenience.  They are aware.  Nothing further needed at this time.

## 2014-03-25 ENCOUNTER — Inpatient Hospital Stay: Payer: Medicare Other | Admitting: Pulmonary Disease

## 2014-03-30 ENCOUNTER — Emergency Department (HOSPITAL_COMMUNITY): Payer: Medicare Other

## 2014-03-30 ENCOUNTER — Inpatient Hospital Stay (HOSPITAL_COMMUNITY)
Admission: EM | Admit: 2014-03-30 | Discharge: 2014-04-04 | DRG: 189 | Disposition: A | Discharge status: Hospice | Payer: Medicare Other | Attending: Internal Medicine | Admitting: Internal Medicine

## 2014-03-30 ENCOUNTER — Encounter (HOSPITAL_COMMUNITY): Payer: Self-pay | Admitting: Emergency Medicine

## 2014-03-30 DIAGNOSIS — E785 Hyperlipidemia, unspecified: Secondary | ICD-10-CM

## 2014-03-30 DIAGNOSIS — Z87891 Personal history of nicotine dependence: Secondary | ICD-10-CM

## 2014-03-30 DIAGNOSIS — R6883 Chills (without fever): Secondary | ICD-10-CM

## 2014-03-30 DIAGNOSIS — J209 Acute bronchitis, unspecified: Secondary | ICD-10-CM

## 2014-03-30 DIAGNOSIS — K5730 Diverticulosis of large intestine without perforation or abscess without bleeding: Secondary | ICD-10-CM

## 2014-03-30 DIAGNOSIS — D126 Benign neoplasm of colon, unspecified: Secondary | ICD-10-CM

## 2014-03-30 DIAGNOSIS — Z86718 Personal history of other venous thrombosis and embolism: Secondary | ICD-10-CM

## 2014-03-30 DIAGNOSIS — I2789 Other specified pulmonary heart diseases: Secondary | ICD-10-CM | POA: Diagnosis present

## 2014-03-30 DIAGNOSIS — R0609 Other forms of dyspnea: Secondary | ICD-10-CM

## 2014-03-30 DIAGNOSIS — Z0000 Encounter for general adult medical examination without abnormal findings: Secondary | ICD-10-CM

## 2014-03-30 DIAGNOSIS — Z860100 Personal history of colon polyps, unspecified: Secondary | ICD-10-CM

## 2014-03-30 DIAGNOSIS — I359 Nonrheumatic aortic valve disorder, unspecified: Secondary | ICD-10-CM

## 2014-03-30 DIAGNOSIS — Z6829 Body mass index (BMI) 29.0-29.9, adult: Secondary | ICD-10-CM

## 2014-03-30 DIAGNOSIS — J4489 Other specified chronic obstructive pulmonary disease: Secondary | ICD-10-CM

## 2014-03-30 DIAGNOSIS — I4719 Other supraventricular tachycardia: Secondary | ICD-10-CM

## 2014-03-30 DIAGNOSIS — J9601 Acute respiratory failure with hypoxia: Secondary | ICD-10-CM

## 2014-03-30 DIAGNOSIS — D751 Secondary polycythemia: Secondary | ICD-10-CM

## 2014-03-30 DIAGNOSIS — J9620 Acute and chronic respiratory failure, unspecified whether with hypoxia or hypercapnia: Principal | ICD-10-CM | POA: Diagnosis present

## 2014-03-30 DIAGNOSIS — M1990 Unspecified osteoarthritis, unspecified site: Secondary | ICD-10-CM

## 2014-03-30 DIAGNOSIS — I509 Heart failure, unspecified: Secondary | ICD-10-CM | POA: Diagnosis present

## 2014-03-30 DIAGNOSIS — I679 Cerebrovascular disease, unspecified: Secondary | ICD-10-CM

## 2014-03-30 DIAGNOSIS — R0602 Shortness of breath: Secondary | ICD-10-CM

## 2014-03-30 DIAGNOSIS — I5033 Acute on chronic diastolic (congestive) heart failure: Secondary | ICD-10-CM | POA: Diagnosis present

## 2014-03-30 DIAGNOSIS — C679 Malignant neoplasm of bladder, unspecified: Secondary | ICD-10-CM

## 2014-03-30 DIAGNOSIS — E663 Overweight: Secondary | ICD-10-CM

## 2014-03-30 DIAGNOSIS — R609 Edema, unspecified: Secondary | ICD-10-CM

## 2014-03-30 DIAGNOSIS — Z9981 Dependence on supplemental oxygen: Secondary | ICD-10-CM

## 2014-03-30 DIAGNOSIS — J189 Pneumonia, unspecified organism: Secondary | ICD-10-CM

## 2014-03-30 DIAGNOSIS — Z66 Do not resuscitate: Secondary | ICD-10-CM | POA: Diagnosis present

## 2014-03-30 DIAGNOSIS — D45 Polycythemia vera: Secondary | ICD-10-CM | POA: Diagnosis present

## 2014-03-30 DIAGNOSIS — R531 Weakness: Secondary | ICD-10-CM

## 2014-03-30 DIAGNOSIS — J449 Chronic obstructive pulmonary disease, unspecified: Secondary | ICD-10-CM

## 2014-03-30 DIAGNOSIS — R0600 Dyspnea, unspecified: Secondary | ICD-10-CM

## 2014-03-30 DIAGNOSIS — Z79899 Other long term (current) drug therapy: Secondary | ICD-10-CM

## 2014-03-30 DIAGNOSIS — I10 Essential (primary) hypertension: Secondary | ICD-10-CM

## 2014-03-30 DIAGNOSIS — I2699 Other pulmonary embolism without acute cor pulmonale: Secondary | ICD-10-CM

## 2014-03-30 DIAGNOSIS — Z7982 Long term (current) use of aspirin: Secondary | ICD-10-CM

## 2014-03-30 DIAGNOSIS — K449 Diaphragmatic hernia without obstruction or gangrene: Secondary | ICD-10-CM

## 2014-03-30 DIAGNOSIS — N289 Disorder of kidney and ureter, unspecified: Secondary | ICD-10-CM

## 2014-03-30 DIAGNOSIS — Z515 Encounter for palliative care: Secondary | ICD-10-CM

## 2014-03-30 DIAGNOSIS — E669 Obesity, unspecified: Secondary | ICD-10-CM | POA: Diagnosis present

## 2014-03-30 DIAGNOSIS — I471 Supraventricular tachycardia: Secondary | ICD-10-CM

## 2014-03-30 DIAGNOSIS — M79604 Pain in right leg: Secondary | ICD-10-CM

## 2014-03-30 DIAGNOSIS — R911 Solitary pulmonary nodule: Secondary | ICD-10-CM

## 2014-03-30 DIAGNOSIS — G4733 Obstructive sleep apnea (adult) (pediatric): Secondary | ICD-10-CM

## 2014-03-30 DIAGNOSIS — Z86711 Personal history of pulmonary embolism: Secondary | ICD-10-CM

## 2014-03-30 DIAGNOSIS — C689 Malignant neoplasm of urinary organ, unspecified: Secondary | ICD-10-CM

## 2014-03-30 DIAGNOSIS — Z8249 Family history of ischemic heart disease and other diseases of the circulatory system: Secondary | ICD-10-CM

## 2014-03-30 DIAGNOSIS — J440 Chronic obstructive pulmonary disease with acute lower respiratory infection: Secondary | ICD-10-CM | POA: Diagnosis present

## 2014-03-30 DIAGNOSIS — N139 Obstructive and reflux uropathy, unspecified: Secondary | ICD-10-CM

## 2014-03-30 DIAGNOSIS — I2609 Other pulmonary embolism with acute cor pulmonale: Secondary | ICD-10-CM

## 2014-03-30 DIAGNOSIS — R0989 Other specified symptoms and signs involving the circulatory and respiratory systems: Secondary | ICD-10-CM

## 2014-03-30 DIAGNOSIS — Z8673 Personal history of transient ischemic attack (TIA), and cerebral infarction without residual deficits: Secondary | ICD-10-CM

## 2014-03-30 LAB — BASIC METABOLIC PANEL
Anion gap: 11 (ref 5–15)
BUN: 26 mg/dL — ABNORMAL HIGH (ref 6–23)
CALCIUM: 8.8 mg/dL (ref 8.4–10.5)
CO2: 31 mEq/L (ref 19–32)
Chloride: 101 mEq/L (ref 96–112)
Creatinine, Ser: 1.33 mg/dL (ref 0.50–1.35)
GFR calc Af Amer: 55 mL/min — ABNORMAL LOW (ref >90)
GFR, EST NON AFRICAN AMERICAN: 47 mL/min — AB (ref >90)
GLUCOSE: 125 mg/dL — AB (ref 70–99)
Potassium: 4.7 mEq/L (ref 3.7–5.3)
SODIUM: 143 meq/L (ref 137–147)

## 2014-03-30 LAB — BLOOD GAS, ARTERIAL
Acid-Base Excess: 4.2 mmol/L — ABNORMAL HIGH (ref 0.0–2.0)
BICARBONATE: 29.2 meq/L — AB (ref 20.0–24.0)
Drawn by: 295031
FIO2: 1 %
O2 Saturation: 92 %
PATIENT TEMPERATURE: 98.6
PH ART: 7.403 (ref 7.350–7.450)
TCO2: 26.4 mmol/L (ref 0–100)
pCO2 arterial: 47.7 mmHg — ABNORMAL HIGH (ref 35.0–45.0)
pO2, Arterial: 64.1 mmHg — ABNORMAL LOW (ref 80.0–100.0)

## 2014-03-30 LAB — PRO B NATRIURETIC PEPTIDE: Pro B Natriuretic peptide (BNP): 686.1 pg/mL — ABNORMAL HIGH (ref 0–450)

## 2014-03-30 LAB — CBC WITH DIFFERENTIAL/PLATELET
BASOS ABS: 0 10*3/uL (ref 0.0–0.1)
Basophils Relative: 0 % (ref 0–1)
EOS PCT: 4 % (ref 0–5)
Eosinophils Absolute: 0.3 10*3/uL (ref 0.0–0.7)
HCT: 39.6 % (ref 39.0–52.0)
Hemoglobin: 12.2 g/dL — ABNORMAL LOW (ref 13.0–17.0)
LYMPHS ABS: 0.9 10*3/uL (ref 0.7–4.0)
Lymphocytes Relative: 11 % — ABNORMAL LOW (ref 12–46)
MCH: 27.9 pg (ref 26.0–34.0)
MCHC: 30.8 g/dL (ref 30.0–36.0)
MCV: 90.4 fL (ref 78.0–100.0)
Monocytes Absolute: 0.8 10*3/uL (ref 0.1–1.0)
Monocytes Relative: 9 % (ref 3–12)
Neutro Abs: 6.1 10*3/uL (ref 1.7–7.7)
Neutrophils Relative %: 76 % (ref 43–77)
PLATELETS: 297 10*3/uL (ref 150–400)
RBC: 4.38 MIL/uL (ref 4.22–5.81)
RDW: 19 % — AB (ref 11.5–15.5)
WBC: 8.1 10*3/uL (ref 4.0–10.5)

## 2014-03-30 LAB — TROPONIN I

## 2014-03-30 LAB — MRSA PCR SCREENING: MRSA BY PCR: NEGATIVE

## 2014-03-30 MED ORDER — GUAIFENESIN ER 600 MG PO TB12: 600.0000 mg | ORAL_TABLET | Freq: Two times a day (BID) | ORAL | Status: DC

## 2014-03-30 MED ORDER — IOHEXOL 350 MG/ML SOLN
100.0000 mL | Freq: Once | INTRAVENOUS | Status: AC | PRN
  Administered 2014-03-30: 100 mL via INTRAVENOUS

## 2014-03-30 MED ORDER — ONDANSETRON HCL 4 MG PO TABS: 4.0000 mg | ORAL_TABLET | Freq: Four times a day (QID) | ORAL | Status: DC | PRN

## 2014-03-30 MED ORDER — DILTIAZEM HCL ER COATED BEADS 180 MG PO CP24
180.0000 mg | ORAL_CAPSULE | Freq: Every day | ORAL | Status: DC
  Administered 2014-03-30 – 2014-04-03 (×5): 180 mg via ORAL
  Filled 2014-03-30 (×6): qty 1

## 2014-03-30 MED ORDER — VANCOMYCIN HCL 10 G IV SOLR
1500.0000 mg | INTRAVENOUS | Status: AC
  Administered 2014-03-30: 1500 mg via INTRAVENOUS
  Filled 2014-03-30: qty 1500

## 2014-03-30 MED ORDER — IPRATROPIUM-ALBUTEROL 0.5-2.5 (3) MG/3ML IN SOLN
3.0000 mL | RESPIRATORY_TRACT | Status: DC
  Administered 2014-03-30 – 2014-04-04 (×23): 3 mL via RESPIRATORY_TRACT
  Filled 2014-03-30 (×23): qty 3

## 2014-03-30 MED ORDER — FUROSEMIDE 10 MG/ML IJ SOLN
40.0000 mg | Freq: Two times a day (BID) | INTRAMUSCULAR | Status: DC
  Administered 2014-03-30 – 2014-04-02 (×6): 40 mg via INTRAVENOUS
  Filled 2014-03-30 (×8): qty 4

## 2014-03-30 MED ORDER — BENZONATATE 100 MG PO CAPS
100.0000 mg | ORAL_CAPSULE | Freq: Three times a day (TID) | ORAL | Status: DC | PRN
  Administered 2014-03-31: 100 mg via ORAL
  Filled 2014-03-30: qty 1

## 2014-03-30 MED ORDER — ATORVASTATIN CALCIUM 20 MG PO TABS
20.0000 mg | ORAL_TABLET | Freq: Every day | ORAL | Status: DC
Start: 2014-03-30 — End: 2014-04-04
  Administered 2014-03-30 – 2014-04-03 (×5): 20 mg via ORAL
  Filled 2014-03-30 (×3): qty 2
  Filled 2014-03-30 (×3): qty 1

## 2014-03-30 MED ORDER — VANCOMYCIN HCL IN DEXTROSE 750-5 MG/150ML-% IV SOLN
750.0000 mg | Freq: Two times a day (BID) | INTRAVENOUS | Status: DC
  Administered 2014-03-31 – 2014-04-02 (×5): 750 mg via INTRAVENOUS
  Filled 2014-03-30 (×6): qty 150

## 2014-03-30 MED ORDER — PANTOPRAZOLE SODIUM 40 MG PO TBEC
40.0000 mg | DELAYED_RELEASE_TABLET | Freq: Every day | ORAL | Status: DC
  Administered 2014-03-30 – 2014-04-04 (×6): 40 mg via ORAL
  Filled 2014-03-30 (×6): qty 1

## 2014-03-30 MED ORDER — DEXTROSE 5 % IV SOLN
1.0000 g | INTRAVENOUS | Status: AC
  Administered 2014-03-30: 1 g via INTRAVENOUS
  Filled 2014-03-30: qty 1

## 2014-03-30 MED ORDER — PSYLLIUM 95 % PO PACK
1.0000 | PACK | Freq: Every day | ORAL | Status: DC
  Administered 2014-03-31 – 2014-04-04 (×5): 1 via ORAL
  Filled 2014-03-30 (×6): qty 1

## 2014-03-30 MED ORDER — ACETAMINOPHEN 650 MG RE SUPP: 650.0000 mg | Freq: Four times a day (QID) | RECTAL | Status: DC | PRN

## 2014-03-30 MED ORDER — FUROSEMIDE 10 MG/ML IJ SOLN: 40.0000 mg | Freq: Two times a day (BID) | INTRAMUSCULAR | Status: DC

## 2014-03-30 MED ORDER — LEVOFLOXACIN IN D5W 750 MG/150ML IV SOLN
750.0000 mg | INTRAVENOUS | Status: DC
  Administered 2014-03-30 – 2014-04-03 (×5): 750 mg via INTRAVENOUS
  Filled 2014-03-30 (×7): qty 150

## 2014-03-30 MED ORDER — MOMETASONE FURO-FORMOTEROL FUM 100-5 MCG/ACT IN AERO
2.0000 | INHALATION_SPRAY | Freq: Two times a day (BID) | RESPIRATORY_TRACT | Status: DC
  Administered 2014-03-30 – 2014-04-04 (×9): 2 via RESPIRATORY_TRACT
  Filled 2014-03-30 (×2): qty 8.8

## 2014-03-30 MED ORDER — ALBUTEROL SULFATE (2.5 MG/3ML) 0.083% IN NEBU: 2.5000 mg | INHALATION_SOLUTION | RESPIRATORY_TRACT | Status: DC | PRN

## 2014-03-30 MED ORDER — OMEGA-3-ACID ETHYL ESTERS 1 G PO CAPS
1.0000 g | ORAL_CAPSULE | Freq: Every day | ORAL | Status: DC
  Administered 2014-03-31 – 2014-04-04 (×5): 1 g via ORAL
  Filled 2014-03-30 (×5): qty 1

## 2014-03-30 MED ORDER — ENOXAPARIN SODIUM 40 MG/0.4ML ~~LOC~~ SOLN
40.0000 mg | SUBCUTANEOUS | Status: DC
  Administered 2014-03-30 – 2014-04-03 (×5): 40 mg via SUBCUTANEOUS
  Filled 2014-03-30 (×6): qty 0.4

## 2014-03-30 MED ORDER — METHYLPREDNISOLONE SODIUM SUCC 125 MG IJ SOLR
125.0000 mg | Freq: Once | INTRAMUSCULAR | Status: AC
  Administered 2014-03-30: 125 mg via INTRAVENOUS
  Filled 2014-03-30: qty 2

## 2014-03-30 MED ORDER — SODIUM CHLORIDE 0.9 % IV SOLN
Freq: Once | INTRAVENOUS | Status: AC
  Administered 2014-03-30: 13:00:00 via INTRAVENOUS

## 2014-03-30 MED ORDER — BIOTENE DRY MOUTH MT LIQD
15.0000 mL | Freq: Two times a day (BID) | OROMUCOSAL | Status: DC
  Administered 2014-03-30 – 2014-04-04 (×8): 15 mL via OROMUCOSAL

## 2014-03-30 MED ORDER — METHYLPREDNISOLONE SODIUM SUCC 125 MG IJ SOLR
80.0000 mg | Freq: Three times a day (TID) | INTRAMUSCULAR | Status: DC
  Administered 2014-03-30 – 2014-04-02 (×9): 80 mg via INTRAVENOUS
  Filled 2014-03-30 (×2): qty 2
  Filled 2014-03-30: qty 1.28
  Filled 2014-03-30 (×3): qty 2
  Filled 2014-03-30 (×2): qty 1.28
  Filled 2014-03-30 (×3): qty 2

## 2014-03-30 MED ORDER — ACETAMINOPHEN 325 MG PO TABS: 650.0000 mg | ORAL_TABLET | Freq: Four times a day (QID) | ORAL | Status: DC | PRN

## 2014-03-30 MED ORDER — ALBUTEROL (5 MG/ML) CONTINUOUS INHALATION SOLN
10.0000 mg/h | INHALATION_SOLUTION | Freq: Once | RESPIRATORY_TRACT | Status: AC
  Administered 2014-03-30: 10 mg/h via RESPIRATORY_TRACT
  Filled 2014-03-30: qty 20

## 2014-03-30 MED ORDER — SODIUM CHLORIDE 0.9 % IJ SOLN
3.0000 mL | Freq: Two times a day (BID) | INTRAMUSCULAR | Status: DC
  Administered 2014-03-30 – 2014-04-03 (×7): 3 mL via INTRAVENOUS

## 2014-03-30 MED ORDER — ONDANSETRON HCL 4 MG/2ML IJ SOLN: 4.0000 mg | Freq: Four times a day (QID) | INTRAMUSCULAR | Status: DC | PRN

## 2014-03-30 MED ORDER — SIMVASTATIN 40 MG PO TABS: 40.0000 mg | ORAL_TABLET | Freq: Every day | ORAL | Status: DC

## 2014-03-30 MED ORDER — DM-GUAIFENESIN ER 30-600 MG PO TB12
1.0000 | ORAL_TABLET | Freq: Two times a day (BID) | ORAL | Status: DC
Start: 2014-03-30 — End: 2014-04-04
  Administered 2014-03-30 – 2014-04-04 (×10): 1 via ORAL
  Filled 2014-03-30 (×13): qty 1

## 2014-03-30 MED ORDER — ASPIRIN EC 81 MG PO TBEC
81.0000 mg | DELAYED_RELEASE_TABLET | Freq: Every morning | ORAL | Status: DC
  Administered 2014-03-31 – 2014-04-03 (×4): 81 mg via ORAL
  Filled 2014-03-30 (×4): qty 1

## 2014-03-30 MED ORDER — DEXTROSE 5 % IV SOLN
1.0000 g | Freq: Three times a day (TID) | INTRAVENOUS | Status: DC
  Administered 2014-03-30 – 2014-04-04 (×15): 1 g via INTRAVENOUS
  Filled 2014-03-30 (×17): qty 1

## 2014-03-30 NOTE — ED Notes (Signed)
Removed continuous neb d/t pt O2 sats dropped to 79%. Pt placed back on NRB per hospitalist who is at bedside. Pt now 94% on 15L.

## 2014-03-30 NOTE — ED Notes (Signed)
Patient denies pain and is resting comfortably.  

## 2014-03-30 NOTE — H&P (Addendum)
[facility] History and Physical  Tracy [name] ZJI:[id number] DOB: 1926/08/22 DOA: 03/30/2014  Referring physician: Dr. Joseph Berkshire PCP: Penni Homans, MD   Chief Complaint:  Progressive shortness of breath for 2 weeks  HPI:  80 year old male with history of advanced COPD on home O2 (8-10 L via nasal cannula), obstructive sleep apnea, recent hospitalization for respiratory failure with COPD exacerbation and bilateral lower lobe pneumonia, recently diagnosed MAT treated with Cardizem and plan for outpatient event monitor presented to the ED with 2 weeks of worsened dyspnea on exertion and increased leg swelling.  Patient reports that he was doing well and ambulating short distance after being discharged from the hospital for the first 2 weeks. However he then started developing increased work of breathing and noted that his O2 sats dropped to low 80s even on 10 L via nasal cannula. He also noticed increased leg settings. He was seen by his pulmonologist Dr Elsworth Soho 10 days back and he increased his Lasix to 40 mg twice daily (takes 40 mg in the morning and 20 mg at bedtime) . This morning he also had cough with small amount of hemoptysis. Patient denies headache, dizziness, fever, chills, nausea , vomiting, chest pain, palpitations, abdominal pain, bowel or urinary symptoms. Denies change in weight or appetite. Denies any sick contacts or recent travel. Denies missing his medications.  Course in the ED Patient was hypoxic with O2 sat in 70s to low 80s . Patient was placed on nonrebreather and given 125 mg IV Solu-Medrol followed by albuterol Neb.  Chest x-ray done suggested bilateral lower lobe pneumonia and was given a dose of IV vancomycin and cefepime. Given history of PE a CT angiogram of the chest was done which was negative for PE and showed somewhat improvement in findings of bilateral pneumonia from the imaging one month back. Hospitalist consulted for admission to step down. On  my evaluation patient maintaining sats on nonrebreather and he reports his shortness of breath doing much better now.  Review of Systems:  Constitutional: Denies fever, chills, diaphoresis, appetite change and fatigue.  HEENT: Denies  eye pain, , ear pain, congestion, sore throat, rhinorrhea, sneezing,  trouble swallowing, neck pain, neck stiffness and tinnitus.   Respiratory:  SOB, DOE, cough, hemoptysis , denies chest tightness,  and wheezing.   Cardiovascular: Denies chest pain, palpitations and leg swelling.  Gastrointestinal: Denies nausea, vomiting, abdominal pain, diarrhea, constipation, blood in stool and abdominal distention.  Genitourinary: Denies dysuria, urgency, frequency, hematuria, flank pain and difficulty urinating.  Endocrine: Denies: hot or cold intolerance, sweats, changes in hair or nails, polyuria, polydipsia. Musculoskeletal: Denies myalgias, back pain, joint swelling, arthralgias and gait problem.  Skin: Denies pallor, rash and wound.  Neurological: Denies dizziness, seizures, syncope, weakness, light-headedness, numbness and headaches.  Psychiatric/Behavioral: Denies  confusion,  sleep disturbance and agitation   Past Medical History  Diagnosis Date  . Chronic airway obstruction, not elsewhere classified   . Unspecified essential hypertension   . Cerebrovascular disease, unspecified   . Other and unspecified hyperlipidemia   . Diaphragmatic hernia without mention of obstruction or gangrene   . Diverticulosis of colon (without mention of hemorrhage)   . Benign neoplasm of colon   . Overweight(278.02)   . On home oxygen therapy   . H/O tobacco use, presenting hazards to health 01/25/2013  . Edema 04/18/2013  . OSA on CPAP   . Peripheral vascular disease     hx of DVT stopped Coumadin 06/05/2013   . Cancer  kidney cancer   . Arthritis   . Polycythemia   . DVT (deep venous thrombosis)     2014   . Pulmonary embolism   . 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  Renal insufficiency 12/23/2013   Past Surgical History  Procedure Laterality Date  . Hernia repair    . Nephroureterectomy    . Blasdder tumor removed     . Transurethral resection of bladder tumor N/A 06/25/2013    Procedure: TRANSURETHRAL RESECTION OF BLADDER TUMOR (TURBT);  Surgeon: Claybon Jabs, MD;  Location: [facility];  Service: Urology;  Laterality: N/A;  . Hydrocele excision Right 06/25/2013    Procedure: HYDROCELECTOMY ADULT;  Surgeon: Claybon Jabs, MD;  Location: [facility];  Service: Urology;  Laterality: Right;   Social History:  reports that he quit smoking about 35 years ago. His smoking use included Cigarettes. He started smoking about 68 years ago. He has a 54 pack-year smoking history. He has never used smokeless tobacco. He reports that he does not drink alcohol or use illicit drugs.  Allergies  Allergen Reactions  . Sulfamethoxazole Other (See Comments)    REACTION: "washes" him out per pt    Family History  Problem Relation Age of Onset  . Cancer Mother     colon with mets  . COPD Father   . Hypertension Father   . Arthritis Sister   . Other Son     h/o cystitis    Prior to Admission medications   Medication Sig Start Date End Date Taking? Authorizing Provider  acetaminophen (TYLENOL ARTHRITIS PAIN) 650 MG CR tablet Take 650 mg by mouth every morning.    Yes Historical Provider, MD  albuterol (PROVENTIL) (2.5 MG/3ML) 0.083% nebulizer solution Take 2.5 mg by nebulization every 4 (four) hours as needed for wheezing or shortness of breath.   Yes Historical Provider, MD  aspirin EC 81 MG tablet Take 81 mg by mouth every morning.   Yes Historical Provider, MD  benzonatate (TESSALON) 100 MG capsule Take 100 mg by mouth 3 (three) times daily as needed for cough.   Yes Historical Provider, MD  dextromethorphan-guaiFENesin (MUCINEX DM) 30-600 MG per 12 hr tablet Take 1 tablet by mouth 2 (two) times daily.   Yes Historical Provider, MD  diltiazem (CARDIZEM CD) 180 MG 24 hr  capsule Take 180 mg by mouth at bedtime.    Yes Historical Provider, MD  Fluticasone-Salmeterol (ADVAIR) 250-50 MCG/DOSE AEPB Inhale 1 puff into the lungs 2 (two) times daily.   Yes Historical Provider, MD  furosemide (LASIX) 40 MG tablet Take 20-40 mg by mouth 2 (two) times daily. 40 mg in the morning and 20 mg in the evening   Yes Historical Provider, MD  Omega-3 Fatty Acids (FISH OIL) 1200 MG CAPS Take 1,200 mg by mouth every morning.   Yes Historical Provider, MD  psyllium (METAMUCIL) 58.6 % packet Take 1 packet by mouth daily.   Yes Historical Provider, MD  simvastatin (ZOCOR) 40 MG tablet Take 40 mg by mouth at bedtime.    Yes Historical Provider, MD  tiotropium (SPIRIVA) 18 MCG inhalation capsule Place 18 mcg into inhaler and inhale daily.   Yes Historical Provider, MD     Physical Exam:  Filed Vitals:   03/30/14 1315 03/30/14 1330 03/30/14 1349 03/30/14 1400  BP:  116/65  104/71  Pulse: 78 80  78  Temp:      TempSrc:      Resp: 19 25  23   Height:  Weight:      SpO2: 99% 94% 95% 88%    Constitutional: Vital signs reviewed. Elderly obese male on nonrebreather HEENT: no pallor, no icterus, moist oral mucosa, no JVD Cardiovascular: RRR, S1 normal, S2 normal, no MRG Chest: Diminished breath sounds over lung bases, no rhonchi, wheeze or crackles Abdominal: Soft. Non-tender, non-distended, bowel sounds are normal,  Ext: warm, 1 + pitting edema bilateral  Neurological: A&O x3, non focal  Labs on Admission:  Basic Metabolic Panel:  Recent Labs Lab 03/30/14 1122  NA 143  K 4.7  CL 101  CO2 31  GLUCOSE 125*  BUN 26*  CREATININE 1.33  CALCIUM 8.8   Liver Function Tests: No results found for this basename: AST, ALT, ALKPHOS, BILITOT, PROT, ALBUMIN,  in the last 168 hours No results found for this basename: LIPASE, AMYLASE,  in the last 168 hours No results found for this basename: AMMONIA,  in the last 168 hours CBC:  Recent Labs Lab 03/30/14 1122  WBC 8.1   NEUTROABS 6.1  HGB 12.2*  HCT 39.6  MCV 90.4  PLT 297   Cardiac Enzymes:  Recent Labs Lab 03/30/14 1122  TROPONINI <0.30   BNP: No components found with this basename: POCBNP,  CBG: No results found for this basename: GLUCAP,  in the last 168 hours  Radiological Exams on Admission: Dg Chest 2 View  03/30/2014   CLINICAL DATA:  COPD, coughing up blood  EXAM: CHEST  2 VIEW  COMPARISON:  CT chest 02/27/2014  FINDINGS: Bilateral emphysematous changes. Bilateral lower lobe airspace disease. Small bilateral pleural effusions. No pneumothorax. Stable cardiomediastinal silhouette. Thoracic aortic atherosclerosis. Unremarkable osseous structures.  IMPRESSION: Findings concerning for bilateral lower lobe pneumonia.   Electronically Signed   By: Kathreen Devoid   On: 03/30/2014 11:17   Ct Angio Chest Pe W/cm &/or Wo Cm  03/30/2014   CLINICAL DATA:  Shortness of breath. Recent episodes of hemoptysis. Current history of COPD. Prior history of bladder cancer post TURBT.  EXAM: CT ANGIOGRAPHY CHEST WITH CONTRAST  TECHNIQUE: Multidetector CT imaging of the chest was performed using the standard protocol during bolus administration of intravenous contrast. Multiplanar CT image reconstructions and MIPs were obtained to evaluate the vascular anatomy.  CONTRAST:  179mL OMNIPAQUE IOHEXOL 350 MG/ML IV.  COMPARISON:  CTA chest 02/27/2014, 09/18/2012.  CT chest 03/19/2013.  FINDINGS: Contrast opacification of the pulmonary arteries is very good respiratory motion blurred images of the bases. Overall, the study is of good diagnostic quality.  No filling defects within either main pulmonary artery or their branches in either lung to suggest pulmonary embolism. Heart size normal. Mild left ventricular hypertrophy. Mitral annular calcification. Extensive aortic valvular calcification. Severe 3 vessel coronary atherosclerosis. Stable very small pericardial effusion. Moderate to severe atherosclerosis involving the thoracic  and upper abdominal aorta without aneurysm or dissection.  Severe bullous emphysematous changes in both lungs. Mild airspace consolidation in the lower lobes, associated with bilateral pleural effusions. Pleural effusions have increased in size since the prior CT 1 month ago. The airspace consolidation in the right lower lobe is not significantly changed. The consolidation in the left lower lobe has improved. No evidence of airspace consolidation elsewhere in either lung.  Numerous normal-sized and mildly enlarged mediastinal and bilateral hilar lymph nodes, unchanged, the largest a subcarinal node measuring approximately 1.3 x 2.5 cm (series 4, image 54). No new or enlarging lymphadenopathy. Thyroid gland atrophic.  Hernia involving the anterior portion of the diaphragm, with extrusion of intra-abdominal  fat into the anterior mediastinum. Remaining visualized upper abdomen unremarkable. Bone window images demonstrate DISH, osseous demineralization, and exaggeration of the usual thoracic kyphosis.  Review of the MIP images confirms the above findings.  IMPRESSION: 1. No evidence of pulmonary embolism. 2. Severe COPD/emphysema. Bilateral lower lobe pneumonia and associated small bilateral pleural effusions. The pneumonia in the right lower lobe is stable since 1 month ago and the pneumonia in the left lower lobe has improved. 3. Stable reactive mediastinal and bilateral hilar lymphadenopathy. No new or enlarging lymphadenopathy. 4. Diaphragmatic hernia anteriorly as noted previously. 5. Severe 3 vessel coronary atherosclerosis. Extensive aortic valvular calcification.   Electronically Signed   By: Evangeline Dakin M.D.   On: 03/30/2014 13:19    EKG: Normal sinus rhythm at 88, no ST-T changes  Assessment/Plan Acute on chronic respiratory failure Admit to step down. Likely the setting of COPD exacerbation. No signs of PE on CT angiogram and recent pneumonia seems to be improving. -pt currently maintaining sats  on NRB . We'll try to wean off to Ventimask if tolerated. -Patient received 125 mg IV Solu-Medrol in the ED. Will place her on solumederol 80 mg q8hr. -place on scheduled duoneb q4hr. Albuterol nebs every 2 hours as needed. Add empiric levaquin 750 mg q24hr. Send sputum culture. -I would place him on Lasix 40 mg IV twice a day. Monitor I/O -Pulmonary consult as needed.   Active Problems:   OBSTRUCTIVE SLEEP APNEA On CPAP at home which will be ordered.  Hemoptysis Likely due to bronchitis . Check sputum culture.  Recent MAT HR stable. Continue cardizem  Polycythemia Follows with Dr Marin Olp as outpt   CVA Continue aspirin and statin    Diet:cardiac  DVT prophylaxis: sq lovenox   Code Status: Partial code ( Do not intubate)  Family Communication: Wife and son at bedside  Disposition Plan: Home once improved  Aksel Bencomo, [facility] Pager [phone]  Total time spent on admission :70 minutes  If 7PM-7AM, please contact night-coverage [email or web address] Password [email or web address] 03/30/2014, 2:26 PM

## 2014-03-30 NOTE — ED Notes (Signed)
Resp at bedside admin continuous neb tx

## 2014-03-30 NOTE — Progress Notes (Signed)
ANTIBIOTIC CONSULT NOTE - INITIAL  Pharmacy Consult for Vancomycin/Cefepime Indication: pneumonia  Allergies  Allergen Reactions  . Sulfamethoxazole Other (See Comments)    REACTION: "washes" him out per pt    Patient Measurements:     Vital Signs: Temp: 98.6 F (37 C) (07/04 1053) Temp src: Oral (07/04 1053) BP: 106/58 mmHg (07/04 1200) Pulse Rate: 83 (07/04 1200) Intake/Output from previous day:   Intake/Output from this shift:    Labs:  Recent Labs  03/30/14 1122  WBC 8.1  HGB 12.2*  PLT 297  CREATININE 1.33   The CrCl is unknown because both a height and weight (above a minimum accepted value) are required for this calculation. No results found for this basename: VANCOTROUGH, VANCOPEAK, VANCORANDOM, GENTTROUGH, GENTPEAK, GENTRANDOM, TOBRATROUGH, TOBRAPEAK, TOBRARND, AMIKACINPEAK, AMIKACINTROU, AMIKACIN,  in the last 72 hours   Microbiology: No results found for this or any previous visit (from the past 720 hour(s)).  Medical History: Past Medical History  Diagnosis Date  . Chronic airway obstruction, not elsewhere classified   . Unspecified essential hypertension   . Cerebrovascular disease, unspecified   . Other and unspecified hyperlipidemia   . Diaphragmatic hernia without mention of obstruction or gangrene   . Diverticulosis of colon (without mention of hemorrhage)   . Benign neoplasm of colon   . Overweight(278.02)   . On home oxygen therapy   . H/O tobacco use, presenting hazards to health 01/25/2013  . Edema 04/18/2013  . OSA on CPAP   . Peripheral vascular disease     hx of DVT stopped Coumadin 06/05/2013   . Cancer     kidney cancer   . Arthritis   . Polycythemia   . DVT (deep venous thrombosis)     2014   . Pulmonary embolism   . 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 Renal insufficiency 12/23/2013     Assessment: 36 yoM presents with shortness of breath.  Patient was recently admitted for 4 days on 02/26/14 for CAP and acute on chronic  respiratory failure.  Beginning Cefepime and Vancomycin for HCAP.  Tmax: afebrile WBC: WNL Renal: SCr 1.33 (appears to be near baseline), CrCl~51 ml/min (CG), ~40 ml/min (normalized) Blood cultures sent.  Goal of Therapy:  Vancomycin trough level 15-20 mcg/ml  Plan:  Vancomycin 1500 mg IV x1 then 750 mg IV q12h. Cefepime 1g IV q8h. F/u SCr, trough levels, culture results.   Hershal Coria 03/30/2014,12:32 PM

## 2014-03-30 NOTE — ED Notes (Signed)
Pt from assisted living accompanied by wife via EMS c/o SOB and hemoptysis x2 days. Pt states that he does not know if this has happened before. Pt has mild edema to bilateral ankles but pt states that he always has edema and takes lasix at home. Pt denies N/V/D, CP, urinary s/sx. Pt is A&O and in NAD

## 2014-03-30 NOTE — ED Provider Notes (Addendum)
CSN: [id number]     Arrival date & time 03/30/14  1041 History   First MD Initiated Contact with Patient 03/30/14 1042     Chief Complaint  Patient presents with  . Shortness of Breath  . Hemoptysis     (Consider location/radiation/quality/duration/timing/severity/associated sxs/prior Treatment) HPI Comments: Patient presents to the ER for evaluation of difficulty breathing. Patient reports that he has had increased shortness of the last 2 days. Patient did not have any cough until approximately 30 minutes ago, then developed a cough productive of brown sputum. He has not had fever. She denies abdominal pain, chest pain.  Patient brought to the ER by ambulance. He was found at home on 10 L oxygen by nasal cannula. He reports that he is normally on around 9 L and his oxygen saturation is usually 88-90%. He has not been able to keep his oxygen up.  Patient does report that he has noticed increased swelling of his legs.  Patient is a 80 y.o. male presenting with shortness of breath.  Shortness of Breath Associated symptoms: cough     Past Medical History  Diagnosis Date  . Chronic airway obstruction, not elsewhere classified   . Unspecified essential hypertension   . Cerebrovascular disease, unspecified   . Other and unspecified hyperlipidemia   . Diaphragmatic hernia without mention of obstruction or gangrene   . Diverticulosis of colon (without mention of hemorrhage)   . Benign neoplasm of colon   . Overweight(278.02)   . On home oxygen therapy   . H/O tobacco use, presenting hazards to health 01/25/2013  . Edema 04/18/2013  . OSA on CPAP   . Peripheral vascular disease     hx of DVT stopped Coumadin 06/05/2013   . Cancer     kidney cancer   . Arthritis   . Polycythemia   . DVT (deep venous thrombosis)     2014   . Pulmonary embolism   . 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 Renal insufficiency 12/23/2013   Past Surgical History  Procedure Laterality Date  . Hernia repair    .  Nephroureterectomy    . Blasdder tumor removed     . Transurethral resection of bladder tumor N/A 06/25/2013    Procedure: TRANSURETHRAL RESECTION OF BLADDER TUMOR (TURBT);  Surgeon: Claybon Jabs, MD;  Location: [facility];  Service: Urology;  Laterality: N/A;  . Hydrocele excision Right 06/25/2013    Procedure: HYDROCELECTOMY ADULT;  Surgeon: Claybon Jabs, MD;  Location: [facility];  Service: Urology;  Laterality: Right;   Family History  Problem Relation Age of Onset  . Cancer Mother     colon with mets  . COPD Father   . Hypertension Father   . Arthritis Sister   . Other Son     h/o cystitis   History  Substance Use Topics  . Smoking status: Former Smoker -- 1.50 packs/day for 36 years    Types: Cigarettes    Start date: 09/09/1945    Quit date: 09/27/1978  . Smokeless tobacco: Never Used     Comment: quit 34 years ago  . Alcohol Use: No    Review of Systems  Respiratory: Positive for cough and shortness of breath.   All other systems reviewed and are negative.     Allergies  Sulfamethoxazole  Home Medications   Prior to Admission medications   Medication Sig Start Date End Date Taking? Authorizing Provider  acetaminophen (TYLENOL ARTHRITIS PAIN) 650 MG CR tablet Take 650 mg by  mouth every morning.    Yes Historical Provider, MD  albuterol (PROVENTIL) (2.5 MG/3ML) 0.083% nebulizer solution Take 2.5 mg by nebulization every 4 (four) hours as needed for wheezing or shortness of breath.   Yes Historical Provider, MD  aspirin EC 81 MG tablet Take 81 mg by mouth every morning.   Yes Historical Provider, MD  benzonatate (TESSALON) 100 MG capsule Take 100 mg by mouth 3 (three) times daily as needed for cough.   Yes Historical Provider, MD  dextromethorphan-guaiFENesin (MUCINEX DM) 30-600 MG per 12 hr tablet Take 1 tablet by mouth 2 (two) times daily.   Yes Historical Provider, MD  diltiazem (CARDIZEM CD) 180 MG 24 hr capsule Take 180 mg by mouth at bedtime.    Yes Historical  Provider, MD  Fluticasone-Salmeterol (ADVAIR) 250-50 MCG/DOSE AEPB Inhale 1 puff into the lungs 2 (two) times daily.   Yes Historical Provider, MD  furosemide (LASIX) 40 MG tablet Take 20-40 mg by mouth 2 (two) times daily. 40 mg in the morning and 20 mg in the evening   Yes Historical Provider, MD  Omega-3 Fatty Acids (FISH OIL) 1200 MG CAPS Take 1,200 mg by mouth every morning.   Yes Historical Provider, MD  psyllium (METAMUCIL) 58.6 % packet Take 1 packet by mouth daily.   Yes Historical Provider, MD  simvastatin (ZOCOR) 40 MG tablet Take 40 mg by mouth at bedtime.    Yes Historical Provider, MD  tiotropium (SPIRIVA) 18 MCG inhalation capsule Place 18 mcg into inhaler and inhale daily.   Yes Historical Provider, MD   BP 124/64  Pulse 78  Temp(Src) 98.6 F (37 C) (Oral)  Resp 19  Ht 5\' 10"  (1.778 m)  Wt 200 lb (90.719 kg)  BMI 28.70 kg/m2  SpO2 99% Physical Exam  Constitutional: He is oriented to person, place, and time. He appears well-developed and well-nourished. No distress.  HENT:  Head: Normocephalic and atraumatic.  Right Ear: Hearing normal.  Left Ear: Hearing normal.  Nose: Nose normal.  Mouth/Throat: Oropharynx is clear and moist and mucous membranes are normal.  Eyes: Conjunctivae and EOM are normal. Pupils are equal, round, and reactive to light.  Neck: Normal range of motion. Neck supple.  Cardiovascular: Regular rhythm, S1 normal and S2 normal.  Exam reveals no gallop and no friction rub.   No murmur heard. Pulmonary/Chest: Effort normal. No respiratory distress. He has decreased breath sounds. He has wheezes. He has rhonchi. He exhibits no tenderness.  Abdominal: Soft. Normal appearance and bowel sounds are normal. There is no hepatosplenomegaly. There is no tenderness. There is no rebound, no guarding, no tenderness at McBurney's point and negative Murphy's sign. No hernia.  Musculoskeletal: Normal range of motion. He exhibits edema.  Neurological: He is alert and  oriented to person, place, and time. He has normal strength. No cranial nerve deficit or sensory deficit. Coordination normal. GCS eye subscore is 4. GCS verbal subscore is 5. GCS motor subscore is 6.  Skin: Skin is warm, dry and intact. No rash noted. No cyanosis.  Psychiatric: He has a normal mood and affect. His speech is normal and behavior is normal. Thought content normal.    ED Course  Procedures (including critical care time) Labs Review Labs Reviewed  CBC WITH DIFFERENTIAL - Abnormal; Notable for the following:    Hemoglobin 12.2 (*)    RDW 19.0 (*)    Lymphocytes Relative 11 (*)    All other components within normal limits  BASIC METABOLIC PANEL -  Abnormal; Notable for the following:    Glucose, Bld 125 (*)    BUN 26 (*)    GFR calc non Af Amer 47 (*)    GFR calc Af Amer 55 (*)    All other components within normal limits  PRO B NATRIURETIC PEPTIDE - Abnormal; Notable for the following:    Pro B Natriuretic peptide (BNP) 686.1 (*)    All other components within normal limits  BLOOD GAS, ARTERIAL - Abnormal; Notable for the following:    pCO2 arterial 47.7 (*)    pO2, Arterial 64.1 (*)    Bicarbonate 29.2 (*)    Acid-Base Excess 4.2 (*)    All other components within normal limits  CULTURE, BLOOD (ROUTINE X 2)  CULTURE, BLOOD (ROUTINE X 2)  TROPONIN I    Imaging Review Dg Chest 2 View  03/30/2014   CLINICAL DATA:  COPD, coughing up blood  EXAM: CHEST  2 VIEW  COMPARISON:  CT chest 02/27/2014  FINDINGS: Bilateral emphysematous changes. Bilateral lower lobe airspace disease. Small bilateral pleural effusions. No pneumothorax. Stable cardiomediastinal silhouette. Thoracic aortic atherosclerosis. Unremarkable osseous structures.  IMPRESSION: Findings concerning for bilateral lower lobe pneumonia.   Electronically Signed   By: Kathreen Devoid   On: 03/30/2014 11:17   Ct Angio Chest Pe W/cm &/or Wo Cm  03/30/2014   CLINICAL DATA:  Shortness of breath. Recent episodes of  hemoptysis. Current history of COPD. Prior history of bladder cancer post TURBT.  EXAM: CT ANGIOGRAPHY CHEST WITH CONTRAST  TECHNIQUE: Multidetector CT imaging of the chest was performed using the standard protocol during bolus administration of intravenous contrast. Multiplanar CT image reconstructions and MIPs were obtained to evaluate the vascular anatomy.  CONTRAST:  139mL OMNIPAQUE IOHEXOL 350 MG/ML IV.  COMPARISON:  CTA chest 02/27/2014, 09/18/2012.  CT chest 03/19/2013.  FINDINGS: Contrast opacification of the pulmonary arteries is very good respiratory motion blurred images of the bases. Overall, the study is of good diagnostic quality.  No filling defects within either main pulmonary artery or their branches in either lung to suggest pulmonary embolism. Heart size normal. Mild left ventricular hypertrophy. Mitral annular calcification. Extensive aortic valvular calcification. Severe 3 vessel coronary atherosclerosis. Stable very small pericardial effusion. Moderate to severe atherosclerosis involving the thoracic and upper abdominal aorta without aneurysm or dissection.  Severe bullous emphysematous changes in both lungs. Mild airspace consolidation in the lower lobes, associated with bilateral pleural effusions. Pleural effusions have increased in size since the prior CT 1 month ago. The airspace consolidation in the right lower lobe is not significantly changed. The consolidation in the left lower lobe has improved. No evidence of airspace consolidation elsewhere in either lung.  Numerous normal-sized and mildly enlarged mediastinal and bilateral hilar lymph nodes, unchanged, the largest a subcarinal node measuring approximately 1.3 x 2.5 cm (series 4, image 54). No new or enlarging lymphadenopathy. Thyroid gland atrophic.  Hernia involving the anterior portion of the diaphragm, with extrusion of intra-abdominal fat into the anterior mediastinum. Remaining visualized upper abdomen unremarkable. Bone  window images demonstrate DISH, osseous demineralization, and exaggeration of the usual thoracic kyphosis.  Review of the MIP images confirms the above findings.  IMPRESSION: 1. No evidence of pulmonary embolism. 2. Severe COPD/emphysema. Bilateral lower lobe pneumonia and associated small bilateral pleural effusions. The pneumonia in the right lower lobe is stable since 1 month ago and the pneumonia in the left lower lobe has improved. 3. Stable reactive mediastinal and bilateral hilar lymphadenopathy. No new or  enlarging lymphadenopathy. 4. Diaphragmatic hernia anteriorly as noted previously. 5. Severe 3 vessel coronary atherosclerosis. Extensive aortic valvular calcification.   Electronically Signed   By: Evangeline Dakin M.D.   On: 03/30/2014 13:19     EKG Interpretation   Date/Time:  Saturday March 30 2014 10:55:39 EDT Ventricular Rate:  88 PR Interval:  178 QRS Duration: 92 QT Interval:  372 QTC Calculation: 450 R Axis:   31 Text Interpretation:  Sinus rhythm Sinus pause Confirmed by Jevante Hollibaugh  MD,  Darshay Deupree ([id number]) on 03/30/2014 11:07:12 AM      MDM   Final diagnoses:  None   COPD  Hypoxia  Bronchitis  Patient presents to the ER for evaluation of shortness of breath. Patient does have a history of COPD. Patient is normally on a significant amount of oxygen by nasal cannula, had to drop his oxygen even further this morning. He was still hypoxic. Patient has begun to cough this morning. Initially it was brown sputum, now it is bloody in the ER. Reviewing the records reveals that he does have a history of PE. Recurrence of PE was therefore considered. Patient was also treated recently for pneumonia.   Chest x-ray did suggest bilateral lower lobe pneumonia. Patient was initiated on cefepime and vancomycin, as he was recently treated as an outpatient for pneumonia.  CT scan was performed to further evaluate the infiltrate and also rule out PE. No PE was seen, bilateral infiltrates and  effusions are seen, but radiologist feels that they are somewhat improved from the previous x-ray one month ago. Because of the patient's hemoptysis is unclear, likely secondary to COPD and bronchitis.  As the patient has significant COPD history with oxygen demand, will admit to step down unit for further treatment.    Orpah Greek, MD 03/30/14 [place], MD 03/30/14 1409

## 2014-03-30 NOTE — ED Notes (Signed)
Notified RT that hospitalist wants to transition pt to a venti mask to see if he can tolerate it. Tracy [name] is on the way to dept

## 2014-03-30 NOTE — Progress Notes (Signed)
RT placed patient on Auto Titrait CPAP. Auto setting is min of 8 and max. Of 20. Patient required 15 liters of oxygen bleed into tubing due to low oxygen saturation levels. RT will continue to monitor.

## 2014-03-30 NOTE — ED Notes (Signed)
Patient transported to X-ray 

## 2014-03-30 NOTE — ED Notes (Signed)
Bed: RESA Expected date:  Expected time:  Means of arrival:  Comments: EMS/shob a-fib

## 2014-03-30 NOTE — ED Notes (Addendum)
Pt from assisted living facility [facility], via EMS c/o SOB x2 days. Pt has hx of COPD and is on 10L at home. EMS reports that pt was 87% on NRB en route. Pt refused neb, C pap with EMS. Pr denies CP. Pt is A&O and in NAD. Pt wife rode along with pt via EMS

## 2014-03-30 NOTE — Progress Notes (Signed)
Patient had on CPAP 5 min and pt started desating. Patient unable to tolerate at this time. Pat sats would not go above 87%. RT placed patient back on non rebreather. RT will continue to monitor.

## 2014-03-31 DIAGNOSIS — J9600 Acute respiratory failure, unspecified whether with hypoxia or hypercapnia: Secondary | ICD-10-CM

## 2014-03-31 DIAGNOSIS — J440 Chronic obstructive pulmonary disease with acute lower respiratory infection: Secondary | ICD-10-CM

## 2014-03-31 DIAGNOSIS — I2609 Other pulmonary embolism with acute cor pulmonale: Secondary | ICD-10-CM

## 2014-03-31 DIAGNOSIS — J449 Chronic obstructive pulmonary disease, unspecified: Secondary | ICD-10-CM

## 2014-03-31 LAB — BASIC METABOLIC PANEL
Anion gap: 13 (ref 5–15)
BUN: 26 mg/dL — ABNORMAL HIGH (ref 6–23)
CALCIUM: 8.3 mg/dL — AB (ref 8.4–10.5)
CHLORIDE: 100 meq/L (ref 96–112)
CO2: 27 mEq/L (ref 19–32)
CREATININE: 1.27 mg/dL (ref 0.50–1.35)
GFR calc non Af Amer: 50 mL/min — ABNORMAL LOW (ref >90)
GFR, EST AFRICAN AMERICAN: 58 mL/min — AB (ref >90)
Glucose, Bld: 167 mg/dL — ABNORMAL HIGH (ref 70–99)
Potassium: 4.8 mEq/L (ref 3.7–5.3)
Sodium: 140 mEq/L (ref 137–147)

## 2014-03-31 LAB — CBC
HCT: 35.6 % — ABNORMAL LOW (ref 39.0–52.0)
Hemoglobin: 11 g/dL — ABNORMAL LOW (ref 13.0–17.0)
MCH: 27.8 pg (ref 26.0–34.0)
MCHC: 30.9 g/dL (ref 30.0–36.0)
MCV: 89.9 fL (ref 78.0–100.0)
Platelets: 284 10*3/uL (ref 150–400)
RBC: 3.96 MIL/uL — ABNORMAL LOW (ref 4.22–5.81)
RDW: 18.7 % — AB (ref 11.5–15.5)
WBC: 7.1 10*3/uL (ref 4.0–10.5)

## 2014-03-31 LAB — EXPECTORATED SPUTUM ASSESSMENT W REFEX TO RESP CULTURE: SPECIAL REQUESTS: NORMAL

## 2014-03-31 LAB — PROCALCITONIN: PROCALCITONIN: 0.12 ng/mL

## 2014-03-31 LAB — EXPECTORATED SPUTUM ASSESSMENT W GRAM STAIN, RFLX TO RESP C

## 2014-03-31 NOTE — Consult Note (Signed)
PULMONARY / CRITICAL CARE MEDICINE  Name: Tracy [name] MRN: [id number] DOB: 1925-10-25    ADMISSION DATE:  03/30/2014 CONSULTATION DATE:  03/31/2014  REFERRING MD :  Dhungel PRIMARY SERVICE:  TRH  CHIEF COMPLAINT:  Respiratory failure  BRIEF PATIENT DESCRIPTION: 80 yo patient of RA with COPD, OSA and chronic hypoxemic respiratory failure ( 8-10 liters of supplemental oxygen ), recently admitted for bilateral lobe pneumonia and COPD exacerbation admitted 7/4 with increased dyspnea x 2 weeks associated with pedal edema and desaturations to 80's with exertion.  10 days prior to admission he was seen by RA and his Lasix was increased to 40 mg PO bid.  He also reports coughing up some phlegm with blood  in it, but there was no purulent sputum.  SIGNIFICANT EVENTS / STUDIES:  12/13 PFT >>> FEV 1.3 (52%), DLCO 5.7 (29%) 5/28   TTE >>> EF 65%, PAP 51 torr  7/4     Admitted with progressive dyspnea and pedal edema 7/4     Chest CTA >>> severe emphysema, bibasilar airspace disease, small bilateral effusions, stable lymphadenopathy  LINES / TUBES:  CULTURES: 7/4 Blood >>> 7/5 Respiratory >>> non diagnostic  ANTIBIOTICS: Cefepime 7/4 >>> Vancomycin 7/4 >>> Levaquin 7/4 >>>  HISTORY OF PRESENT ILLNESS:  80 yo patient of RA with COPD, OSA and chronic hypoxemic respiratory failure ( 8-10 liters of supplemental oxygen ), recently admitted for bilateral lobe pneumonia and COPD exacerbation admitted 7/4 with increased dyspnea x 2 weeks associated with pedal edema and desaturations to 80's with exertion.  10 days prior to admission he was seen by RA and his Lasix was increased to 40 mg PO bid.  He also reports coughing up some phlegm with blood  in it, but there was no purulent sputum.  PAST MEDICAL HISTORY :  Past Medical History  Diagnosis Date  . Chronic airway obstruction, not elsewhere classified   . Unspecified essential hypertension   . Cerebrovascular disease, unspecified   . Other and  unspecified hyperlipidemia   . Diaphragmatic hernia without mention of obstruction or gangrene   . Diverticulosis of colon (without mention of hemorrhage)   . Benign neoplasm of colon   . Overweight(278.02)   . On home oxygen therapy   . H/O tobacco use, presenting hazards to health 01/25/2013  . Edema 04/18/2013  . OSA on CPAP   . Peripheral vascular disease     hx of DVT stopped Coumadin 06/05/2013   . Cancer     kidney cancer   . Arthritis   . Polycythemia   . DVT (deep venous thrombosis)     2014   . Pulmonary embolism   . 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 Renal insufficiency 12/23/2013   Past Surgical History  Procedure Laterality Date  . Hernia repair    . Nephroureterectomy    . Blasdder tumor removed     . Transurethral resection of bladder tumor N/A 06/25/2013    Procedure: TRANSURETHRAL RESECTION OF BLADDER TUMOR (TURBT);  Surgeon: Claybon Jabs, MD;  Location: [facility];  Service: Urology;  Laterality: N/A;  . Hydrocele excision Right 06/25/2013    Procedure: HYDROCELECTOMY ADULT;  Surgeon: Claybon Jabs, MD;  Location: [facility];  Service: Urology;  Laterality: Right;   Prior to Admission medications   Medication Sig Start Date End Date Taking? Authorizing Provider  acetaminophen (TYLENOL ARTHRITIS PAIN) 650 MG CR tablet Take 650 mg by mouth every morning.    Yes Historical Provider, MD  albuterol (  PROVENTIL) (2.5 MG/3ML) 0.083% nebulizer solution Take 2.5 mg by nebulization every 4 (four) hours as needed for wheezing or shortness of breath.   Yes Historical Provider, MD  aspirin EC 81 MG tablet Take 81 mg by mouth every morning.   Yes Historical Provider, MD  benzonatate (TESSALON) 100 MG capsule Take 100 mg by mouth 3 (three) times daily as needed for cough.   Yes Historical Provider, MD  dextromethorphan-guaiFENesin (MUCINEX DM) 30-600 MG per 12 hr tablet Take 1 tablet by mouth 2 (two) times daily.   Yes Historical Provider, MD  diltiazem (CARDIZEM CD) 180 MG 24 hr capsule  Take 180 mg by mouth at bedtime.    Yes Historical Provider, MD  Fluticasone-Salmeterol (ADVAIR) 250-50 MCG/DOSE AEPB Inhale 1 puff into the lungs 2 (two) times daily.   Yes Historical Provider, MD  furosemide (LASIX) 40 MG tablet Take 20-40 mg by mouth 2 (two) times daily. 40 mg in the morning and 20 mg in the evening   Yes Historical Provider, MD  Omega-3 Fatty Acids (FISH OIL) 1200 MG CAPS Take 1,200 mg by mouth every morning.   Yes Historical Provider, MD  psyllium (METAMUCIL) 58.6 % packet Take 1 packet by mouth daily.   Yes Historical Provider, MD  simvastatin (ZOCOR) 40 MG tablet Take 40 mg by mouth at bedtime.    Yes Historical Provider, MD  tiotropium (SPIRIVA) 18 MCG inhalation capsule Place 18 mcg into inhaler and inhale daily.   Yes Historical Provider, MD   Allergies  Allergen Reactions  . Sulfamethoxazole Other (See Comments)    REACTION: "washes" him out per pt   FAMILY HISTORY:  Family History  Problem Relation Age of Onset  . Cancer Mother     colon with mets  . COPD Father   . Hypertension Father   . Arthritis Sister   . Other Son     h/o cystitis   SOCIAL HISTORY:  reports that he quit smoking about 35 years ago. His smoking use included Cigarettes. He started smoking about 68 years ago. He has a 54 pack-year smoking history. He has never used smokeless tobacco. He reports that he does not drink alcohol or use illicit drugs.  REVIEW OF SYSTEMS:  Per HPI  INTERVAL HISTORY:  VITAL SIGNS: Temp:  [97.6 F (36.4 C)-99.2 F (37.3 C)] 97.7 F (36.5 C) (07/05 0800) Pulse Rate:  [78-121] 121 (07/05 1000) Resp:  [11-29] 17 (07/05 1000) BP: (100-140)/(38-94) 135/74 mmHg (07/05 1000) SpO2:  [88 %-100 %] 91 % (07/05 1000) FiO2 (%):  [100 %] 100 % (07/05 0905) Weight:  [90.719 kg (200 lb)-96.5 kg (212 lb 11.9 oz)] 96.5 kg (212 lb 11.9 oz) (07/05 0400) HEMODYNAMICS:   VENTILATOR SETTINGS: Vent Mode:  [-]  FiO2 (%):  [100 %] 100 % INTAKE / OUTPUT: Intake/Output      07/04 0701 - 07/05 0700 07/05 0701 - 07/06 0700   P.O. 480    I.V. (mL/kg) 10 (0.1) 10 (0.1)   IV Piggyback 900    Total Intake(mL/kg) 1390 (14.4) 10 (0.1)   Urine (mL/kg/hr) 1400    Total Output 1400     Net -10 +10        Stool Occurrence  1 x    PHYSICAL EXAMINATION: General:  No apparent distress Neuro:  Awake, alert HEENT:  PERRL Cardiovascular:  RRR, no m/r/g Lungs:  Bilateral diminished air entry, no added sounds Abdomen:  Soft, nontender, bowel sounds diminished Musculoskeletal:  Moves all extremities, pitting edema  Skin:  Intact  LABS: CBC  Recent Labs Lab 03/30/14 1122 03/31/14 0403  WBC 8.1 7.1  HGB 12.2* 11.0*  HCT 39.6 35.6*  PLT 297 284   Coag's No results found for this basename: APTT, INR,  in the last 168 hours  BMET  Recent Labs Lab 03/30/14 1122 03/31/14 0403  NA 143 140  K 4.7 4.8  CL 101 100  CO2 31 27  BUN 26* 26*  CREATININE 1.33 1.27  GLUCOSE 125* 167*   Electrolytes  Recent Labs Lab 03/30/14 1122 03/31/14 0403  CALCIUM 8.8 8.3*   Sepsis Markers No results found for this basename: LATICACIDVEN, PROCALCITON, O2SATVEN,  in the last 168 hours  ABG  Recent Labs Lab 03/30/14 1052  PHART 7.403  PCO2ART 47.7*  PO2ART 64.1*   Liver Enzymes No results found for this basename: AST, ALT, ALKPHOS, BILITOT, ALBUMIN,  in the last 168 hours  Cardiac Enzymes  Recent Labs Lab 03/30/14 1122 03/30/14 1125  TROPONINI <0.30  --   PROBNP  --  686.1*   Glucose No results found for this basename: GLUCAP,  in the last 168 hours  IMAGING: Dg Chest 2 View  03/30/2014   CLINICAL DATA:  COPD, coughing up blood  EXAM: CHEST  2 VIEW  COMPARISON:  CT chest 02/27/2014  FINDINGS: Bilateral emphysematous changes. Bilateral lower lobe airspace disease. Small bilateral pleural effusions. No pneumothorax. Stable cardiomediastinal silhouette. Thoracic aortic atherosclerosis. Unremarkable osseous structures.  IMPRESSION: Findings concerning for  bilateral lower lobe pneumonia.   Electronically Signed   By: Kathreen Devoid   On: 03/30/2014 11:17   Ct Angio Chest Pe W/cm &/or Wo Cm  03/30/2014   CLINICAL DATA:  Shortness of breath. Recent episodes of hemoptysis. Current history of COPD. Prior history of bladder cancer post TURBT.  EXAM: CT ANGIOGRAPHY CHEST WITH CONTRAST  TECHNIQUE: Multidetector CT imaging of the chest was performed using the standard protocol during bolus administration of intravenous contrast. Multiplanar CT image reconstructions and MIPs were obtained to evaluate the vascular anatomy.  CONTRAST:  130mL OMNIPAQUE IOHEXOL 350 MG/ML IV.  COMPARISON:  CTA chest 02/27/2014, 09/18/2012.  CT chest 03/19/2013.  FINDINGS: Contrast opacification of the pulmonary arteries is very good respiratory motion blurred images of the bases. Overall, the study is of good diagnostic quality.  No filling defects within either main pulmonary artery or their branches in either lung to suggest pulmonary embolism. Heart size normal. Mild left ventricular hypertrophy. Mitral annular calcification. Extensive aortic valvular calcification. Severe 3 vessel coronary atherosclerosis. Stable very small pericardial effusion. Moderate to severe atherosclerosis involving the thoracic and upper abdominal aorta without aneurysm or dissection.  Severe bullous emphysematous changes in both lungs. Mild airspace consolidation in the lower lobes, associated with bilateral pleural effusions. Pleural effusions have increased in size since the prior CT 1 month ago. The airspace consolidation in the right lower lobe is not significantly changed. The consolidation in the left lower lobe has improved. No evidence of airspace consolidation elsewhere in either lung.  Numerous normal-sized and mildly enlarged mediastinal and bilateral hilar lymph nodes, unchanged, the largest a subcarinal node measuring approximately 1.3 x 2.5 cm (series 4, image 54). No new or enlarging lymphadenopathy.  Thyroid gland atrophic.  Hernia involving the anterior portion of the diaphragm, with extrusion of intra-abdominal fat into the anterior mediastinum. Remaining visualized upper abdomen unremarkable. Bone window images demonstrate DISH, osseous demineralization, and exaggeration of the usual thoracic kyphosis.  Review of the MIP images confirms the above  findings.  IMPRESSION: 1. No evidence of pulmonary embolism. 2. Severe COPD/emphysema. Bilateral lower lobe pneumonia and associated small bilateral pleural effusions. The pneumonia in the right lower lobe is stable since 1 month ago and the pneumonia in the left lower lobe has improved. 3. Stable reactive mediastinal and bilateral hilar lymphadenopathy. No new or enlarging lymphadenopathy. 4. Diaphragmatic hernia anteriorly as noted previously. 5. Severe 3 vessel coronary atherosclerosis. Extensive aortic valvular calcification.   Electronically Signed   By: Evangeline Dakin M.D.   On: 03/30/2014 13:19   ASSESSMENT / PLAN:  Severe COPD with exacerbation Pulmonary hypertension Cor pulmonale Possible acute on chronic diastolic CHF exacerbation, but pro-BNP is reassuring Doubt acute pneumonia as afebrile without elevated WBC, imaging likely represents residual treated pneumonia History of PE / DVT ( not on anticoagulation, completed 6 months ), acute PE ruled out OSA on CPAP    Supplemental oxygen for SpO2>92  CPAP q HS  Albuterol / Atrovent / Dulera  Solu-Medrol 80 mg IV q8h  Check PCT ( ordered )  Would simplify abx to Levaquin only  Agree with Lasix 40 IV daily ( increase x 2 from home regimen of 40 mg PO daily ), but not sure it will be very effective given primarily R  sided failure   Palliative Care consult to discuss goals of care / hospice placement as this patient has end-stage, progressive, incurable, debilitating  pulmonary disease; also consider clarifying code status as actively stated as CPR "YES" and intubation "NO" in case of arrest,   which is not technically achievable  Will ask case management if there any limitations ( oxygen requirements, etc. ) should the patient desire to be discahrged back to his  independent living    I have personally obtained history, examined patient, evaluated and interpreted laboratory and imaging results, reviewed medical records, formulated assessment / plan and placed orders.  Doree Fudge, MD [facility] [place] Pager: [phone]  03/31/2014, 11:21 AM

## 2014-03-31 NOTE — Progress Notes (Signed)
RT went to help patient with his CPAP and mask and stated he just wanted to wear the partial rebreather. RT will continue to monitor.

## 2014-03-31 NOTE — Progress Notes (Signed)
Goals of care discussion with patient / family >>> DNR status ( NO interventions in case of cardiopulmonary arrest ), DNI ( NO intubation under any circumstances ), otherwise would accept full medical treatment.

## 2014-03-31 NOTE — Progress Notes (Signed)
[facility] PROGRESS NOTE  SELMER ADDUCI IEP:[id number] DOB: [birth year] DOA: 03/30/2014 PCP: Penni Homans, MD  Assessment/Plan  Acute on chronic respiratory failure  Secondary to acute on chronic COPD exacerbation and underlying OSA -Continue stepdown monitoring. Patient still requiring NRB. At home he is on 9-10 L via [place] and satting at 88-90% -IV solumederol 80 mg q8hr.  - scheduled duoneb q4hr. Albuterol nebs every 2 hours as needed. Added empiric levaquin 750 mg q24hr. Send sputum culture.  - Lasix 40 mg IV twice a day.  -Pulmonary consulted. Given chronicity and severe COPD may need hospice.  Active Problems:  OBSTRUCTIVE SLEEP APNEA  On CPAP   Hemoptysis  Likely due to bronchitis . Check sputum culture.   Recent MAT  HR stable. Continue cardizem   Polycythemia  Follows with Dr Marin Olp as outpt   CVA  Continue aspirin and statin   Diet:cardiac  DVT prophylaxis: sq lovenox   Code Status: Partial code ( Do not intubate)   Family Communication: None at bedside  Disposition Plan: Home once improved   HPI/Subjective: Patient seen and examined this morning. He reports his shortness of breath to be better. Still requires NRB  Objective: Filed Vitals:   03/31/14 1000  BP: 135/74  Pulse: 121  Temp:   Resp: 17    Intake/Output Summary (Last 24 hours) at 03/31/14 1022 Last data filed at 03/31/14 0940  Gross per 24 hour  Intake   1400 ml  Output   1400 ml  Net      0 ml   Filed Weights   03/30/14 1233 03/30/14 1535 03/31/14 0400  Weight: 90.719 kg (200 lb) 94.9 kg (209 lb 3.5 oz) 96.5 kg (212 lb 11.9 oz)    Exam:   General:  Elderly obese male NRB. Not in distress  HEENT: No pallor, moist oral mucosa  Cardiovascular: Normal S1 and S2, no murmurs rub or gallop  Respiratory: clear bilaterally, no added sounds  Abdomen: Soft, Nontender, nondistended, bowel sounds present  Musculoskeletal: Warm, no edema  CNS: AAO x3  Data Reviewed: Basic  Metabolic Panel:  Recent Labs Lab 03/30/14 1122 03/31/14 0403  NA 143 140  K 4.7 4.8  CL 101 100  CO2 31 27  GLUCOSE 125* 167*  BUN 26* 26*  CREATININE 1.33 1.27  CALCIUM 8.8 8.3*   Liver Function Tests: No results found for this basename: AST, ALT, ALKPHOS, BILITOT, PROT, ALBUMIN,  in the last 168 hours No results found for this basename: LIPASE, AMYLASE,  in the last 168 hours No results found for this basename: AMMONIA,  in the last 168 hours CBC:  Recent Labs Lab 03/30/14 1122 03/31/14 0403  WBC 8.1 7.1  NEUTROABS 6.1  --   HGB 12.2* 11.0*  HCT 39.6 35.6*  MCV 90.4 89.9  PLT 297 284   Cardiac Enzymes:  Recent Labs Lab 03/30/14 1122  TROPONINI <0.30   BNP (last 3 results)  Recent Labs  03/30/14 1125  PROBNP 686.1*   CBG: No results found for this basename: GLUCAP,  in the last 168 hours  Recent Results (from the past 240 hour(s))  CULTURE, BLOOD (ROUTINE X 2)     Status: None   Collection Time    03/30/14 11:35 AM      Result Value Ref Range Status   Specimen Description BLOOD LEFT ANTECUBITAL   Final   Special Requests BOTTLES DRAWN AEROBIC ONLY 3CC   Final   Culture  Setup Time  Final   Value: 03/30/2014 18:30     Performed at [facility]   Culture     Final   Value:        BLOOD CULTURE RECEIVED NO GROWTH TO DATE CULTURE WILL BE HELD FOR 5 DAYS BEFORE ISSUING A FINAL NEGATIVE REPORT     Performed at [facility]   Report Status PENDING   Incomplete  CULTURE, BLOOD (ROUTINE X 2)     Status: None   Collection Time    03/30/14 11:36 AM      Result Value Ref Range Status   Specimen Description BLOOD RIGHT ANTECUBITAL   Final   Special Requests BOTTLES DRAWN AEROBIC AND ANAEROBIC 5CC EACH   Final   Culture  Setup Time     Final   Value: 03/30/2014 18:30     Performed at [facility]   Culture     Final   Value:        BLOOD CULTURE RECEIVED NO GROWTH TO DATE CULTURE WILL BE HELD FOR 5 DAYS BEFORE ISSUING A FINAL  NEGATIVE REPORT     Performed at [facility]   Report Status PENDING   Incomplete  MRSA PCR SCREENING     Status: None   Collection Time    03/30/14  3:38 PM      Result Value Ref Range Status   MRSA by PCR NEGATIVE  NEGATIVE Final   Comment:            The GeneXpert MRSA Assay (FDA     approved for NASAL specimens     only), is one component of a     comprehensive MRSA colonization     surveillance program. It is not     intended to diagnose MRSA     infection nor to guide or     monitor treatment for     MRSA infections.     Studies: Dg Chest 2 View  03/30/2014   CLINICAL DATA:  COPD, coughing up blood  EXAM: CHEST  2 VIEW  COMPARISON:  CT chest 02/27/2014  FINDINGS: Bilateral emphysematous changes. Bilateral lower lobe airspace disease. Small bilateral pleural effusions. No pneumothorax. Stable cardiomediastinal silhouette. Thoracic aortic atherosclerosis. Unremarkable osseous structures.  IMPRESSION: Findings concerning for bilateral lower lobe pneumonia.   Electronically Signed   By: Kathreen Devoid   On: 03/30/2014 11:17   Ct Angio Chest Pe W/cm &/or Wo Cm  03/30/2014   CLINICAL DATA:  Shortness of breath. Recent episodes of hemoptysis. Current history of COPD. Prior history of bladder cancer post TURBT.  EXAM: CT ANGIOGRAPHY CHEST WITH CONTRAST  TECHNIQUE: Multidetector CT imaging of the chest was performed using the standard protocol during bolus administration of intravenous contrast. Multiplanar CT image reconstructions and MIPs were obtained to evaluate the vascular anatomy.  CONTRAST:  140mL OMNIPAQUE IOHEXOL 350 MG/ML IV.  COMPARISON:  CTA chest 02/27/2014, 09/18/2012.  CT chest 03/19/2013.  FINDINGS: Contrast opacification of the pulmonary arteries is very good respiratory motion blurred images of the bases. Overall, the study is of good diagnostic quality.  No filling defects within either main pulmonary artery or their branches in either lung to suggest pulmonary  embolism. Heart size normal. Mild left ventricular hypertrophy. Mitral annular calcification. Extensive aortic valvular calcification. Severe 3 vessel coronary atherosclerosis. Stable very small pericardial effusion. Moderate to severe atherosclerosis involving the thoracic and upper abdominal aorta without aneurysm or dissection.  Severe bullous emphysematous changes in both lungs. Mild  airspace consolidation in the lower lobes, associated with bilateral pleural effusions. Pleural effusions have increased in size since the prior CT 1 month ago. The airspace consolidation in the right lower lobe is not significantly changed. The consolidation in the left lower lobe has improved. No evidence of airspace consolidation elsewhere in either lung.  Numerous normal-sized and mildly enlarged mediastinal and bilateral hilar lymph nodes, unchanged, the largest a subcarinal node measuring approximately 1.3 x 2.5 cm (series 4, image 54). No new or enlarging lymphadenopathy. Thyroid gland atrophic.  Hernia involving the anterior portion of the diaphragm, with extrusion of intra-abdominal fat into the anterior mediastinum. Remaining visualized upper abdomen unremarkable. Bone window images demonstrate DISH, osseous demineralization, and exaggeration of the usual thoracic kyphosis.  Review of the MIP images confirms the above findings.  IMPRESSION: 1. No evidence of pulmonary embolism. 2. Severe COPD/emphysema. Bilateral lower lobe pneumonia and associated small bilateral pleural effusions. The pneumonia in the right lower lobe is stable since 1 month ago and the pneumonia in the left lower lobe has improved. 3. Stable reactive mediastinal and bilateral hilar lymphadenopathy. No new or enlarging lymphadenopathy. 4. Diaphragmatic hernia anteriorly as noted previously. 5. Severe 3 vessel coronary atherosclerosis. Extensive aortic valvular calcification.   Electronically Signed   By: Evangeline Dakin M.D.   On: 03/30/2014 13:19     Scheduled Meds: . antiseptic oral rinse  15 mL Mouth Rinse BID  . aspirin EC  81 mg Oral q morning - 10a  . atorvastatin  20 mg Oral QHS  . ceFEPime (MAXIPIME) IV  1 g Intravenous 3 times per day  . dextromethorphan-guaiFENesin  1 tablet Oral BID  . diltiazem  180 mg Oral QHS  . enoxaparin (LOVENOX) injection  40 mg Subcutaneous Q24H  . furosemide  40 mg Intravenous BID  . ipratropium-albuterol  3 mL Nebulization Q4H  . levofloxacin (LEVAQUIN) IV  750 mg Intravenous Q24H  . methylPREDNISolone (SOLU-MEDROL) injection  80 mg Intravenous 3 times per day  . mometasone-formoterol  2 puff Inhalation BID  . omega-3 acid ethyl esters  1 g Oral Daily  . pantoprazole  40 mg Oral Daily  . psyllium  1 packet Oral Daily  . sodium chloride  3 mL Intravenous Q12H  . vancomycin  750 mg Intravenous Q12H   Continuous Infusions:     Time spent: 25 minutes    Baeleigh Devincent, [place]  [facility] Pager [phone] If 7PM-7AM, please contact night-coverage at [email or web address], password [facility] 03/31/2014, 10:22 AM  LOS: 1 day

## 2014-04-01 ENCOUNTER — Encounter (HOSPITAL_COMMUNITY): Payer: Self-pay

## 2014-04-01 DIAGNOSIS — R5381 Other malaise: Secondary | ICD-10-CM

## 2014-04-01 DIAGNOSIS — R5383 Other fatigue: Secondary | ICD-10-CM

## 2014-04-01 DIAGNOSIS — Z515 Encounter for palliative care: Secondary | ICD-10-CM

## 2014-04-01 LAB — PROCALCITONIN: PROCALCITONIN: 0.1 ng/mL

## 2014-04-01 NOTE — Progress Notes (Signed)
[facility] PROGRESS NOTE  Tracy [name] XHB:[id number] DOB: 05/20/1926 DOA: 03/30/2014 PCP: Penni Homans, MD   Brief narrative 80 year old male with history of advanced COPD on home O2 (8-10 L via nasal cannula), obstructive sleep apnea, recent hospitalization for respiratory failure with COPD exacerbation and bilateral lower lobe pneumonia, recently diagnosed MAT treated with Cardizem and plan for outpatient event monitor presented to the ED with 2 weeks of worsened dyspnea on exertion and increased leg swelling. Patient admitted with acute on chronic respiratory failure secondary to underlying advanced COPD.   Assessment/Plan  Acute on chronic respiratory failure  Secondary to acute on chronic COPD exacerbation and underlying OSA -Continue stepdown monitoring. Patient still requiring NRB. At home he is on 9-10 L via [place] and satting at 88-90% -Continue IV solumederol 80 mg q8hr.  - scheduled duoneb q4hr. Albuterol nebs every 2 hours as needed. Added empiric levaquin 750 mg q24hr. Follow sputum culture.  - Lasix 40 mg IV twice a day.  -Pulmonary consult appreciated . Plan to keep o2 sat >85 %. Given chronicity and severe COPD may need hospice. Patient now wishes to be DNR and he and his family agree on discussion for [place] with palliative care.   Active Problems:  OBSTRUCTIVE SLEEP APNEA  On CPAP at night  Hemoptysis  Likely due to bronchitis . Check sputum culture.   Recent MAT  HR stable. Continue cardizem   Polycythemia  Follows with Dr Marin Olp as outpt   CVA  Continue aspirin and statin   Diet:cardiac  DVT prophylaxis: sq lovenox   Code Status: DNR  Family Communication: None at bedside  Disposition Plan: Home once improved   HPI/Subjective: Patient seen and examined this morning. Still requires NRB  Objective: Filed Vitals:   04/01/14 0800  BP:   Pulse:   Temp: 97.9 F (36.6 C)  Resp:     Intake/Output Summary (Last 24 hours) at 04/01/14 1046 Last  data filed at 04/01/14 1000  Gross per 24 hour  Intake    950 ml  Output   1875 ml  Net   -925 ml   Filed Weights   03/30/14 1535 03/31/14 0400 04/01/14 0400  Weight: 94.9 kg (209 lb 3.5 oz) 96.5 kg (212 lb 11.9 oz) 95 kg (209 lb 7 oz)    Exam:   General:  Elderly obese male NRB. Not in distress  HEENT: No pallor, moist oral mucosa  Cardiovascular: Normal S1 and S2, no murmurs rub or gallop  Respiratory: clear bilaterally, no added sounds  Abdomen: Soft, Nontender, nondistended, bowel sounds present  Musculoskeletal: Warm, no edema  CNS: AAO x3  Data Reviewed: Basic Metabolic Panel:  Recent Labs Lab 03/30/14 1122 03/31/14 0403  NA 143 140  K 4.7 4.8  CL 101 100  CO2 31 27  GLUCOSE 125* 167*  BUN 26* 26*  CREATININE 1.33 1.27  CALCIUM 8.8 8.3*   Liver Function Tests: No results found for this basename: AST, ALT, ALKPHOS, BILITOT, PROT, ALBUMIN,  in the last 168 hours No results found for this basename: LIPASE, AMYLASE,  in the last 168 hours No results found for this basename: AMMONIA,  in the last 168 hours CBC:  Recent Labs Lab 03/30/14 1122 03/31/14 0403  WBC 8.1 7.1  NEUTROABS 6.1  --   HGB 12.2* 11.0*  HCT 39.6 35.6*  MCV 90.4 89.9  PLT 297 284   Cardiac Enzymes:  Recent Labs Lab 03/30/14 1122  TROPONINI <0.30   BNP (last 3 results)  Recent Labs  03/30/14 1125  PROBNP 686.1*   CBG: No results found for this basename: GLUCAP,  in the last 168 hours  Recent Results (from the past 240 hour(s))  CULTURE, BLOOD (ROUTINE X 2)     Status: None   Collection Time    03/30/14 11:35 AM      Result Value Ref Range Status   Specimen Description BLOOD LEFT ANTECUBITAL   Final   Special Requests BOTTLES DRAWN AEROBIC ONLY 3CC   Final   Culture  Setup Time     Final   Value: 03/30/2014 18:30     Performed at [facility]   Culture     Final   Value: GRAM POSITIVE COCCI IN CLUSTERS     Note: Gram Stain Report Called to,Read Back By  and Verified With: MIKE MORGAN 03/31/14 @ 7:31PM BY RUSCOE A.     Performed at [facility]   Report Status PENDING   Incomplete  CULTURE, BLOOD (ROUTINE X 2)     Status: None   Collection Time    03/30/14 11:36 AM      Result Value Ref Range Status   Specimen Description BLOOD RIGHT ANTECUBITAL   Final   Special Requests BOTTLES DRAWN AEROBIC AND ANAEROBIC 5CC EACH   Final   Culture  Setup Time     Final   Value: 03/30/2014 18:30     Performed at [facility]   Culture     Final   Value:        BLOOD CULTURE RECEIVED NO GROWTH TO DATE CULTURE WILL BE HELD FOR 5 DAYS BEFORE ISSUING A FINAL NEGATIVE REPORT     Performed at [facility]   Report Status PENDING   Incomplete  MRSA PCR SCREENING     Status: None   Collection Time    03/30/14  3:38 PM      Result Value Ref Range Status   MRSA by PCR NEGATIVE  NEGATIVE Final   Comment:            The GeneXpert MRSA Assay (FDA     approved for NASAL specimens     only), is one component of a     comprehensive MRSA colonization     surveillance program. It is not     intended to diagnose MRSA     infection nor to guide or     monitor treatment for     MRSA infections.  CULTURE, EXPECTORATED SPUTUM-ASSESSMENT     Status: None   Collection Time    03/31/14  7:00 AM      Result Value Ref Range Status   Specimen Description SPUTUM   Final   Special Requests Normal   Final   Sputum evaluation     Final   Value: MICROSCOPIC FINDINGS SUGGEST THAT THIS SPECIMEN IS NOT REPRESENTATIVE OF LOWER RESPIRATORY SECRETIONS. PLEASE RECOLLECT.     CALLED TO DENISE AT [phone] ON [id number] BY HOOKER,B   Report Status 03/31/2014 FINAL   Final     Studies: Dg Chest 2 View  03/30/2014   CLINICAL DATA:  COPD, coughing up blood  EXAM: CHEST  2 VIEW  COMPARISON:  CT chest 02/27/2014  FINDINGS: Bilateral emphysematous changes. Bilateral lower lobe airspace disease. Small bilateral pleural effusions. No pneumothorax. Stable cardiomediastinal  silhouette. Thoracic aortic atherosclerosis. Unremarkable osseous structures.  IMPRESSION: Findings concerning for bilateral lower lobe pneumonia.   Electronically Signed   By: Kathreen Devoid  On: 03/30/2014 11:17   Ct Angio Chest Pe W/cm &/or Wo Cm  03/30/2014   CLINICAL DATA:  Shortness of breath. Recent episodes of hemoptysis. Current history of COPD. Prior history of bladder cancer post TURBT.  EXAM: CT ANGIOGRAPHY CHEST WITH CONTRAST  TECHNIQUE: Multidetector CT imaging of the chest was performed using the standard protocol during bolus administration of intravenous contrast. Multiplanar CT image reconstructions and MIPs were obtained to evaluate the vascular anatomy.  CONTRAST:  174mL OMNIPAQUE IOHEXOL 350 MG/ML IV.  COMPARISON:  CTA chest 02/27/2014, 09/18/2012.  CT chest 03/19/2013.  FINDINGS: Contrast opacification of the pulmonary arteries is very good respiratory motion blurred images of the bases. Overall, the study is of good diagnostic quality.  No filling defects within either main pulmonary artery or their branches in either lung to suggest pulmonary embolism. Heart size normal. Mild left ventricular hypertrophy. Mitral annular calcification. Extensive aortic valvular calcification. Severe 3 vessel coronary atherosclerosis. Stable very small pericardial effusion. Moderate to severe atherosclerosis involving the thoracic and upper abdominal aorta without aneurysm or dissection.  Severe bullous emphysematous changes in both lungs. Mild airspace consolidation in the lower lobes, associated with bilateral pleural effusions. Pleural effusions have increased in size since the prior CT 1 month ago. The airspace consolidation in the right lower lobe is not significantly changed. The consolidation in the left lower lobe has improved. No evidence of airspace consolidation elsewhere in either lung.  Numerous normal-sized and mildly enlarged mediastinal and bilateral hilar lymph nodes, unchanged, the largest a  subcarinal node measuring approximately 1.3 x 2.5 cm (series 4, image 54). No new or enlarging lymphadenopathy. Thyroid gland atrophic.  Hernia involving the anterior portion of the diaphragm, with extrusion of intra-abdominal fat into the anterior mediastinum. Remaining visualized upper abdomen unremarkable. Bone window images demonstrate DISH, osseous demineralization, and exaggeration of the usual thoracic kyphosis.  Review of the MIP images confirms the above findings.  IMPRESSION: 1. No evidence of pulmonary embolism. 2. Severe COPD/emphysema. Bilateral lower lobe pneumonia and associated small bilateral pleural effusions. The pneumonia in the right lower lobe is stable since 1 month ago and the pneumonia in the left lower lobe has improved. 3. Stable reactive mediastinal and bilateral hilar lymphadenopathy. No new or enlarging lymphadenopathy. 4. Diaphragmatic hernia anteriorly as noted previously. 5. Severe 3 vessel coronary atherosclerosis. Extensive aortic valvular calcification.   Electronically Signed   By: Evangeline Dakin M.D.   On: 03/30/2014 13:19    Scheduled Meds: . antiseptic oral rinse  15 mL Mouth Rinse BID  . aspirin EC  81 mg Oral q morning - 10a  . atorvastatin  20 mg Oral QHS  . ceFEPime (MAXIPIME) IV  1 g Intravenous 3 times per day  . dextromethorphan-guaiFENesin  1 tablet Oral BID  . diltiazem  180 mg Oral QHS  . enoxaparin (LOVENOX) injection  40 mg Subcutaneous Q24H  . furosemide  40 mg Intravenous BID  . ipratropium-albuterol  3 mL Nebulization Q4H  . levofloxacin (LEVAQUIN) IV  750 mg Intravenous Q24H  . methylPREDNISolone (SOLU-MEDROL) injection  80 mg Intravenous 3 times per day  . mometasone-formoterol  2 puff Inhalation BID  . omega-3 acid ethyl esters  1 g Oral Daily  . pantoprazole  40 mg Oral Daily  . psyllium  1 packet Oral Daily  . sodium chloride  3 mL Intravenous Q12H  . vancomycin  750 mg Intravenous Q12H   Continuous Infusions:     Time spent: 25  minutes  Louellen Molder  [facility] Pager [phone] If 7PM-7AM, please contact night-coverage at [email or web address], password [facility] 04/01/2014, 10:46 AM  LOS: 2 days

## 2014-04-01 NOTE — Progress Notes (Signed)
Patient placed himself on home CPAP with home nasal mask. Machine has been checked by [employer]. 13L bled in per patient request. Patient sats 94%. RT will continue to monitor.

## 2014-04-01 NOTE — Progress Notes (Signed)
Clinical Social Work Department BRIEF PSYCHOSOCIAL ASSESSMENT 04/01/2014  Patient:  Tracy [name], Tracy [name]     Account Number:  [id number]     Admit date:  03/30/2014  Clinical Social Worker:  Lacie Scotts  Date/Time:  04/01/2014 12:08 PM  Referred by:  Physician  Date Referred:  04/01/2014 Referred for  SNF Placement   Other Referral:   Interview type:  Patient Other interview type:    PSYCHOSOCIAL DATA Living Status:  FACILITY Admitted from facility:  [facility] Level of care:  Independent Living Primary support name:  Zelphia [place] Primary support relationship to patient:  SPOUSE Degree of support available:   supportive    CURRENT CONCERNS Current Concerns  Post-Acute Placement   Other Concerns:    SOCIAL WORK ASSESSMENT / PLAN Pt is an 80 yr old gentleman admitted to [place] from [facility]. Pt has been residing there with spouse. Pt now requires SNF placement at facility. Clinicals have been provided to SNF and a bed offer has been received. Pt plans to have hospice at SNF. He has  14 " grace days " that he can use for room & board. Insurance will cover hospice. Family will contact SNF to discuss options following grace days . CSW will continue to follow to assist with d/c planning.   Assessment/plan status:  Psychosocial Support/Ongoing Assessment of Needs Other assessment/ plan:   Information/referral to community resources:   Insurance coverage for SNF placement reviewed.    PATIENT'S/FAMILY'S RESPONSE TO PLAN OF CARE: Pt and family feel SNF placement is needed at this time. Daughter understands that Insurance will cover Hospice at [facility] and grace days will cover room & board for 14 days.  If pt remains in SNF past 14 days he will be responsible for room & board. Daughter will contact SNF and begin long term planning.   Tracy Lean LCSW [phone]

## 2014-04-01 NOTE — Consult Note (Signed)
Patient Tracy [name]      DOB: 1926-03-03      WSF:[id number]     Consult Note from the Palliative Medicine Team at [place] Requested by: Dr Tracy [name]    PCP: Tracy Homans, MD Reason for Consultation:Clarifiction of [place] and options     Phone Number:[phone]  Assessment of patients Current state:  80 year old male with history of advanced COPD on home O2 (8-10 L via nasal cannula), obstructive sleep apnea, recent hospitalization for respiratory failure with COPD exacerbation and bilateral lower lobe pneumonia.  Continued physical, functional decline secondary to chronic disease.  Pateitn is faced with advanced directive decisions and anticipatory care nneds   Consult is for review of medical treatment options, clarification of goals of care and end of life issues, disposition and options, and symptom recommendation.  This NP Tracy [name] reviewed medical records, received report from team, assessed the patient and then meet at the patient's bedside along with his wife and daughter Tracy [name] # [phone]  to discuss diagnosis prognosis, GOC, EOL wishes disposition and options.  A detailed discussion was had today regarding advanced directives.  Concepts specific to code status, artifical feeding and hydration, continued IV antibiotics and rehospitalization was had.  The difference between a aggressive medical intervention path  and a palliative comfort care path for this patient at this time was had.  Values and goals of care important to patient and family were attempted to be elicited.  Concept of Hospice and Palliative Care were discussed  Natural trajectory and expectations at EOL were discussed.  Questions and concerns addressed.  Hard Choices booklet left for review. Family encouraged to call with questions or concerns.  PMT will continue to support holistically.   Goals of Care: 1.  Code Status:DNR/DNI  2. Scope of Treatment:  Patient understands his  overall poor prognosis.  His main focus is quality of life and comfort.  Completed MOST form details his wishes   1. Vital Sign: per unit  2. Respiratory/Oxygen: as needed for comfort, he hopes to continue with his high dose oxygen once discahrged 3. Nutritional Support/Tube Feeds: no artifical feeding now or in the future 4. Antibiotics: yes 5. Labs: until discharge   3 . Disposition:  Hopeful for discharge to appropriate level of  care at [facility].  He presently lives in [place]  and  his wife will not be able to care for him there (she has cognitive deficit and is elderly herself) Hopeful for Skilled level with hospice service/appraoch.  It will be important to have a solid plan in place before discharge, this pateitn may deteriorate quickly and need aggressive symptom managmetn   4. Symptom Management:   Recommend  1. Anxiety/Agitation: Ativan 1 mg PO every 6 hrs prn 2. Pain/Dyspnea: Roxanol 5 mg PO every 2 hrs prn  Convert IV medications to oral in preparation for discahrge   5. Psychosocial:  Emotional support offered to patient and family    Patient Documents Completed or Given: Document Given Completed  Advanced Directives Pkt    MOST  yes  DNR    Gone from [facility]    Hard Choices yes     Brief HPI: 80 yo patient of RA with COPD, OSA and chronic hypoxemic respiratory failure ( 8-10 liters of supplemental oxygen ), recently admitted for bilateral lobe pneumonia and COPD exacerbation admitted 7/4 with increased dyspnea x 2 weeks associated with pedal edema and desaturations to 80's with exertion.  ROS: weakness, dyspnea on minimal exertion   PMH:  Past Medical History  Diagnosis Date  . Chronic airway obstruction, not elsewhere classified   . Unspecified essential hypertension   . Cerebrovascular disease, unspecified   . Other and unspecified hyperlipidemia   . Diaphragmatic hernia without mention of obstruction or gangrene   . Diverticulosis  of colon (without mention of hemorrhage)   . Benign neoplasm of colon   . Overweight(278.02)   . On home oxygen therapy   . H/O tobacco use, presenting hazards to health 01/25/2013  . Edema 04/18/2013  . OSA on CPAP   . Peripheral vascular disease     hx of DVT stopped Coumadin 06/05/2013   . Cancer     kidney cancer   . Arthritis   . Polycythemia   . DVT (deep venous thrombosis)     2014   . Pulmonary embolism   . 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 Renal insufficiency 12/23/2013     PSH: Past Surgical History  Procedure Laterality Date  . Hernia repair    . Nephroureterectomy    . Blasdder tumor removed     . Transurethral resection of bladder tumor N/A 06/25/2013    Procedure: TRANSURETHRAL RESECTION OF BLADDER TUMOR (TURBT);  Surgeon: Claybon Jabs, MD;  Location: [facility];  Service: Urology;  Laterality: N/A;  . Hydrocele excision Right 06/25/2013    Procedure: HYDROCELECTOMY ADULT;  Surgeon: Claybon Jabs, MD;  Location: [facility];  Service: Urology;  Laterality: Right;   I have reviewed the [place] and SH and  If appropriate update it with new information. Allergies  Allergen Reactions  . Sulfamethoxazole Other (See Comments)    REACTION: "washes" him out per pt   Scheduled Meds: . antiseptic oral rinse  15 mL Mouth Rinse BID  . aspirin EC  81 mg Oral q morning - 10a  . atorvastatin  20 mg Oral QHS  . ceFEPime (MAXIPIME) IV  1 g Intravenous 3 times per day  . dextromethorphan-guaiFENesin  1 tablet Oral BID  . diltiazem  180 mg Oral QHS  . enoxaparin (LOVENOX) injection  40 mg Subcutaneous Q24H  . furosemide  40 mg Intravenous BID  . ipratropium-albuterol  3 mL Nebulization Q4H  . levofloxacin (LEVAQUIN) IV  750 mg Intravenous Q24H  . methylPREDNISolone (SOLU-MEDROL) injection  80 mg Intravenous 3 times per day  . mometasone-formoterol  2 puff Inhalation BID  . omega-3 acid ethyl esters  1 g Oral Daily  . pantoprazole  40 mg Oral Daily  . psyllium  1 packet Oral Daily  .  sodium chloride  3 mL Intravenous Q12H  . vancomycin  750 mg Intravenous Q12H   Continuous Infusions:  PRN Meds:.acetaminophen, acetaminophen, albuterol, benzonatate, ondansetron (ZOFRAN) IV, ondansetron    BP 113/70  Pulse 70  Temp(Src) 98.3 F (36.8 C) (Oral)  Resp 19  Ht 5\' 10"  (1.778 m)  Wt 95 kg (209 lb 7 oz)  BMI 30.05 kg/m2  SpO2 86%   PPS: 30 %   Intake/Output Summary (Last 24 hours) at 04/01/14 8921 Last data filed at 04/01/14 0600  Gross per 24 hour  Intake   1310 ml  Output   1475 ml  Net   -165 ml    Physical Exam:  General: Chronically ill appearing HEENT:  moist buccal membranes Chest:   Diminished throughout CVS: RRR Abdomen:soft NT +BS Ext:  Neuro: alert and oriented X3  Labs: CBC    Component Value Date/Time  WBC 7.1 03/31/2014 0403   WBC 8.8 01/08/2014 0939   RBC 3.96* 03/31/2014 0403   RBC 4.81 01/08/2014 0939   HGB 11.0* 03/31/2014 0403   HGB 13.8 01/08/2014 0939   HCT 35.6* 03/31/2014 0403   HCT 43.5 01/08/2014 0939   PLT 284 03/31/2014 0403   PLT 231 01/08/2014 0939   MCV 89.9 03/31/2014 0403   MCV 90 01/08/2014 0939   MCH 27.8 03/31/2014 0403   MCH 28.7 01/08/2014 0939   MCHC 30.9 03/31/2014 0403   MCHC 31.7* 01/08/2014 0939   RDW 18.7* 03/31/2014 0403   RDW 15.1 01/08/2014 0939   LYMPHSABS 0.9 03/30/2014 1122   LYMPHSABS 1.2 01/08/2014 0939   MONOABS 0.8 03/30/2014 1122   EOSABS 0.3 03/30/2014 1122   EOSABS 0.4 01/08/2014 0939   BASOSABS 0.0 03/30/2014 1122   BASOSABS 0.0 01/08/2014 0939    BMET    Component Value Date/Time   NA 140 03/31/2014 0403   K 4.8 03/31/2014 0403   CL 100 03/31/2014 0403   CO2 27 03/31/2014 0403   GLUCOSE 167* 03/31/2014 0403   BUN 26* 03/31/2014 0403   CREATININE 1.27 03/31/2014 0403   CREATININE 1.31 03/11/2014 1030   CALCIUM 8.3* 03/31/2014 0403   GFRNONAA 50* 03/31/2014 0403   GFRAA 58* 03/31/2014 0403    CMP     Component Value Date/Time   NA 140 03/31/2014 0403   K 4.8 03/31/2014 0403   CL 100 03/31/2014 0403   CO2 27 03/31/2014 0403    GLUCOSE 167* 03/31/2014 0403   BUN 26* 03/31/2014 0403   CREATININE 1.27 03/31/2014 0403   CREATININE 1.31 03/11/2014 1030   CALCIUM 8.3* 03/31/2014 0403   PROT 6.2 03/11/2014 1030   ALBUMIN 3.7 03/11/2014 1030   AST 29 03/11/2014 1030   ALT 36 03/11/2014 1030   ALKPHOS 45 03/11/2014 1030   BILITOT 0.4 03/11/2014 1030   GFRNONAA 50* 03/31/2014 0403   GFRAA 58* 03/31/2014 0403     Time In Time Out Total Time Spent with Patient Total Overall Time  0845 1000 70 min 75 min    Greater than 50%  of this time was spent counseling and coordinating care related to the above assessment and plan.   Tracy Lessen NP  Palliative Medicine Team Team Phone # [phone] Pager [phone]  Discussed with Dr Tracy [name]

## 2014-04-01 NOTE — Progress Notes (Addendum)
Clinical Social Work Department CLINICAL SOCIAL WORK PLACEMENT NOTE 04/01/2014  Patient:  Tracy [name], Tracy [name]  Account Number:  [id number] Admit date:  03/30/2014  Clinical Social Worker:  Werner Lean, LCSW  Date/time:  04/01/2014 12:36 PM  Clinical Social Work is seeking post-discharge placement for this patient at the following level of care:   SKILLED NURSING   (*CSW will update this form in Epic as items are completed)   04/01/2014  Patient/family provided with [facility] Department of Clinical Social Work's list of facilities offering this level of care within the geographic area requested by the patient (or if unable, by the patient's family).  04/01/2014  Patient/family informed of their freedom to choose among providers that offer the needed level of care, that participate in Medicare, Medicaid or managed care program needed by the patient, have an available bed and are willing to accept the patient.    Patient/family informed of [facility]' ownership interest in [facility], as well as of the fact that they are under no obligation to receive care at this facility.  PASARR submitted to EDS on 04/01/2014 PASARR number received on 04/01/2014  FL2 transmitted to all facilities in geographic area requested by pt/family on  04/01/2014 FL2 transmitted to all facilities within larger geographic area on   Patient informed that his/her managed care company has contracts with or will negotiate with  certain facilities, including the following:     Patient/family informed of bed offers received:  04/01/2014 Patient chooses bed at [facility] recommends and patient chooses bed at    Patient to be transferred to  on  [facility] on 04/04/2014 Patient to be transferred to facility by ambulance Corey Harold) Patient and family notified of transfer on 04/04/2014 Name of family member notified:  Pt notified at bedside, left voice message for pt daughter, Anderson [place]   The  following physician request were entered in Epic:   Additional Comments:  Werner Lean LCSW [place], MSW, [place] Work [phone]

## 2014-04-01 NOTE — Progress Notes (Signed)
PULMONARY / CRITICAL CARE MEDICINE  Name: Tracy [name] MRN: [id number] DOB: 08-17-1926    ADMISSION DATE:  03/30/2014 CONSULTATION DATE:  03/31/2014  REFERRING MD :  Dhungel PRIMARY SERVICE:  TRH  CHIEF COMPLAINT:  Respiratory failure  BRIEF PATIENT DESCRIPTION: 80 yo patient of RA with COPD, OSA and chronic hypoxemic respiratory failure ( 8-10 liters of supplemental oxygen ), recently admitted for bilateral lobe pneumonia and COPD exacerbation admitted 7/4 with increased dyspnea x 2 weeks associated with pedal edema and desaturations to 80's with exertion.  10 days prior to admission he was seen by RA and his Lasix was increased to 40 mg PO bid.  He also reports coughing up some phlegm with blood  in it, but there was no purulent sputum.  SIGNIFICANT EVENTS / STUDIES:  12/13 PFT >>> FEV 1.3 (52%), DLCO 5.7 (29%) 5/28   TTE >>> EF 65%, PAP 51 torr  7/4     Admitted with progressive dyspnea and pedal edema 7/4     Chest CTA >>> severe emphysema, bibasilar airspace disease -improved, small bilateral effusions, stable lymphadenopathy  LINES / TUBES:  CULTURES: 7/4 Blood >>> GPC clusters >> 7/5 Respiratory >>> non diagnostic  ANTIBIOTICS: Cefepime 7/4 >>> Vancomycin 7/4 >>> Levaquin 7/4 >>>   INTERVAL HISTORY: afebrile Denies CP Desatn when takes NRB off  VITAL SIGNS: Temp:  [98.3 F (36.8 C)-99.4 F (37.4 C)] 98.3 F (36.8 C) (07/06 0400) Pulse Rate:  [70-124] 70 (07/06 0400) Resp:  [16-27] 19 (07/06 0400) BP: (113-138)/(55-75) 113/70 mmHg (07/06 0400) SpO2:  [85 %-97 %] 86 % (07/06 0417) FiO2 (%):  [100 %] 100 % (07/05 1551) Weight:  [209 lb 7 oz (95 kg)] 209 lb 7 oz (95 kg) (07/06 0400) HEMODYNAMICS:   VENTILATOR SETTINGS: Vent Mode:  [-]  FiO2 (%):  [100 %] 100 % INTAKE / OUTPUT: Intake/Output     07/05 0701 - 07/06 0700 07/06 0701 - 07/07 0700   P.O. 1050    I.V. (mL/kg) 10 (0.1)    IV Piggyback 600    Total Intake(mL/kg) 1660 (17.5)    Urine (mL/kg/hr) 1475  (0.6)    Total Output 1475     Net +185          Stool Occurrence 1 x     PHYSICAL EXAMINATION: General:  No apparent distress Neuro:  Awake, alert HEENT:  PERRL Cardiovascular:  RRR, no m/r/g Lungs:  Bilateral diminished air entry, no added sounds Abdomen:  Soft, nontender, bowel sounds diminished Musculoskeletal:  Moves all extremities,1 + pitting edema Skin:  Intact  LABS: CBC  Recent Labs Lab 03/30/14 1122 03/31/14 0403  WBC 8.1 7.1  HGB 12.2* 11.0*  HCT 39.6 35.6*  PLT 297 284   Coag's No results found for this basename: APTT, INR,  in the last 168 hours  BMET  Recent Labs Lab 03/30/14 1122 03/31/14 0403  NA 143 140  K 4.7 4.8  CL 101 100  CO2 31 27  BUN 26* 26*  CREATININE 1.33 1.27  GLUCOSE 125* 167*   Electrolytes  Recent Labs Lab 03/30/14 1122 03/31/14 0403  CALCIUM 8.8 8.3*   Sepsis Markers  Recent Labs Lab 03/31/14 1204 04/01/14 0412  PROCALCITON 0.12 0.10    ABG  Recent Labs Lab 03/30/14 1052  PHART 7.403  PCO2ART 47.7*  PO2ART 64.1*   Liver Enzymes No results found for this basename: AST, ALT, ALKPHOS, BILITOT, ALBUMIN,  in the last 168 hours  Cardiac Enzymes  Recent  Labs Lab 03/30/14 1122 03/30/14 1125  TROPONINI <0.30  --   PROBNP  --  686.1*   Glucose No results found for this basename: GLUCAP,  in the last 168 hours  IMAGING: Dg Chest 2 View  03/30/2014   CLINICAL DATA:  COPD, coughing up blood  EXAM: CHEST  2 VIEW  COMPARISON:  CT chest 02/27/2014  FINDINGS: Bilateral emphysematous changes. Bilateral lower lobe airspace disease. Small bilateral pleural effusions. No pneumothorax. Stable cardiomediastinal silhouette. Thoracic aortic atherosclerosis. Unremarkable osseous structures.  IMPRESSION: Findings concerning for bilateral lower lobe pneumonia.   Electronically Signed   By: Kathreen Devoid   On: 03/30/2014 11:17   Ct Angio Chest Pe W/cm &/or Wo Cm  03/30/2014   CLINICAL DATA:  Shortness of breath. Recent  episodes of hemoptysis. Current history of COPD. Prior history of bladder cancer post TURBT.  EXAM: CT ANGIOGRAPHY CHEST WITH CONTRAST  TECHNIQUE: Multidetector CT imaging of the chest was performed using the standard protocol during bolus administration of intravenous contrast. Multiplanar CT image reconstructions and MIPs were obtained to evaluate the vascular anatomy.  CONTRAST:  180mL OMNIPAQUE IOHEXOL 350 MG/ML IV.  COMPARISON:  CTA chest 02/27/2014, 09/18/2012.  CT chest 03/19/2013.  FINDINGS: Contrast opacification of the pulmonary arteries is very good respiratory motion blurred images of the bases. Overall, the study is of good diagnostic quality.  No filling defects within either main pulmonary artery or their branches in either lung to suggest pulmonary embolism. Heart size normal. Mild left ventricular hypertrophy. Mitral annular calcification. Extensive aortic valvular calcification. Severe 3 vessel coronary atherosclerosis. Stable very small pericardial effusion. Moderate to severe atherosclerosis involving the thoracic and upper abdominal aorta without aneurysm or dissection.  Severe bullous emphysematous changes in both lungs. Mild airspace consolidation in the lower lobes, associated with bilateral pleural effusions. Pleural effusions have increased in size since the prior CT 1 month ago. The airspace consolidation in the right lower lobe is not significantly changed. The consolidation in the left lower lobe has improved. No evidence of airspace consolidation elsewhere in either lung.  Numerous normal-sized and mildly enlarged mediastinal and bilateral hilar lymph nodes, unchanged, the largest a subcarinal node measuring approximately 1.3 x 2.5 cm (series 4, image 54). No new or enlarging lymphadenopathy. Thyroid gland atrophic.  Hernia involving the anterior portion of the diaphragm, with extrusion of intra-abdominal fat into the anterior mediastinum. Remaining visualized upper abdomen  unremarkable. Bone window images demonstrate DISH, osseous demineralization, and exaggeration of the usual thoracic kyphosis.  Review of the MIP images confirms the above findings.  IMPRESSION: 1. No evidence of pulmonary embolism. 2. Severe COPD/emphysema. Bilateral lower lobe pneumonia and associated small bilateral pleural effusions. The pneumonia in the right lower lobe is stable since 1 month ago and the pneumonia in the left lower lobe has improved. 3. Stable reactive mediastinal and bilateral hilar lymphadenopathy. No new or enlarging lymphadenopathy. 4. Diaphragmatic hernia anteriorly as noted previously. 5. Severe 3 vessel coronary atherosclerosis. Extensive aortic valvular calcification.   Electronically Signed   By: Evangeline Dakin M.D.   On: 03/30/2014 13:19   ASSESSMENT / PLAN:  Severe COPD with exacerbation Pulmonary hypertension Cor pulmonale Possible acute on chronic diastolic CHF exacerbation, but pro-BNP is reassuring Doubt acute pneumonia as afebrile without elevated WBC, imaging likely represents residual treated pneumonia History of PE / DVT ( not on anticoagulation, completed 6 months ), acute PE ruled out OSA on CPAP    Supplemental oxygen for SpO2>92  CPAP q HS  Albuterol / Atrovent / Dulera  Solu-Medrol 80 mg IV q8h  ct Levaquin + vanc -until Zihlman speciated (expect contaminant)  ct Lasix 40 IV q 12h ( increase x 2 from home regimen of 40 mg PO daily ) as renal fn permits, but not sure it will be very effective given primarily R sided failure   Palliative Care consult to discuss goals of care / hospice placement as this patient has end-stage, progressive, incurable, debilitating pulmonary disease;DNR appropriate; he is agreeable to hospice  High flow oxygen may,imit his discharge back to [facility]   I have personally obtained history, examined patient, evaluated and interpreted laboratory and imaging results, reviewed medical records, formulated assessment / plan and  placed orders.  Rigoberto Noel., MD Kara Mead MD. FCCP. [facility] Pager [phone] If no response call [phone]    04/01/2014, 8:17 AM

## 2014-04-02 DIAGNOSIS — I359 Nonrheumatic aortic valve disorder, unspecified: Secondary | ICD-10-CM

## 2014-04-02 LAB — CULTURE, BLOOD (ROUTINE X 2)

## 2014-04-02 LAB — PROCALCITONIN: PROCALCITONIN: 0.1 ng/mL

## 2014-04-02 MED ORDER — METHYLPREDNISOLONE SODIUM SUCC 125 MG IJ SOLR
80.0000 mg | Freq: Two times a day (BID) | INTRAMUSCULAR | Status: DC
  Administered 2014-04-03 (×2): 80 mg via INTRAVENOUS
  Filled 2014-04-02 (×3): qty 1.28

## 2014-04-02 MED ORDER — FUROSEMIDE 40 MG PO TABS
40.0000 mg | ORAL_TABLET | Freq: Two times a day (BID) | ORAL | Status: DC
  Administered 2014-04-02 – 2014-04-04 (×4): 40 mg via ORAL
  Filled 2014-04-02 (×6): qty 1

## 2014-04-02 NOTE — Progress Notes (Signed)
Progress Note from the Palliative Medicine Team at [facility]:  -patient is resting comfortably in bed,  NAD  -continued conversation with daughter [name] regarding goals of care and discharge plan   Objective: Allergies  Allergen Reactions  . Sulfamethoxazole Other (See Comments)    REACTION: "washes" him out per pt   Scheduled Meds: . antiseptic oral rinse  15 mL Mouth Rinse BID  . aspirin EC  81 mg Oral q morning - 10a  . atorvastatin  20 mg Oral QHS  . ceFEPime (MAXIPIME) IV  1 g Intravenous 3 times per day  . dextromethorphan-guaiFENesin  1 tablet Oral BID  . diltiazem  180 mg Oral QHS  . enoxaparin (LOVENOX) injection  40 mg Subcutaneous Q24H  . furosemide  40 mg Intravenous BID  . ipratropium-albuterol  3 mL Nebulization Q4H  . levofloxacin (LEVAQUIN) IV  750 mg Intravenous Q24H  . methylPREDNISolone (SOLU-MEDROL) injection  80 mg Intravenous 3 times per day  . mometasone-formoterol  2 puff Inhalation BID  . omega-3 acid ethyl esters  1 g Oral Daily  . pantoprazole  40 mg Oral Daily  . psyllium  1 packet Oral Daily  . sodium chloride  3 mL Intravenous Q12H   Continuous Infusions:  PRN Meds:.acetaminophen, acetaminophen, albuterol, benzonatate, ondansetron (ZOFRAN) IV, ondansetron  BP 148/70  Pulse 85  Temp(Src) 97.7 F (36.5 C) (Oral)  Resp 16  Ht 5\' 10"  (1.778 m)  Wt 94.2 kg (207 lb 10.8 oz)  BMI 29.80 kg/m2  SpO2 100%   PPS: 40 % at best  Pain Score:denies    Intake/Output Summary (Last 24 hours) at 04/02/14 1224 Last data filed at 04/02/14 0900  Gross per 24 hour  Intake    600 ml  Output   1725 ml  Net  -1125 ml       Physical Exam:  General: chronically ill appearing HEENT:  Moist membranes, no exudate Chest:   Diminished through out, CTA CVS: RRR Abdomen:soft NT +BS Ext:  Without edema Neuro:alert and oriented X3  Labs: CBC    Component Value Date/Time   WBC 7.1 03/31/2014 0403   WBC 8.8 01/08/2014 0939   RBC  3.96* 03/31/2014 0403   RBC 4.81 01/08/2014 0939   HGB 11.0* 03/31/2014 0403   HGB 13.8 01/08/2014 0939   HCT 35.6* 03/31/2014 0403   HCT 43.5 01/08/2014 0939   PLT 284 03/31/2014 0403   PLT 231 01/08/2014 0939   MCV 89.9 03/31/2014 0403   MCV 90 01/08/2014 0939   MCH 27.8 03/31/2014 0403   MCH 28.7 01/08/2014 0939   MCHC 30.9 03/31/2014 0403   MCHC 31.7* 01/08/2014 0939   RDW 18.7* 03/31/2014 0403   RDW 15.1 01/08/2014 0939   LYMPHSABS 0.9 03/30/2014 1122   LYMPHSABS 1.2 01/08/2014 0939   MONOABS 0.8 03/30/2014 1122   EOSABS 0.3 03/30/2014 1122   EOSABS 0.4 01/08/2014 0939   BASOSABS 0.0 03/30/2014 1122   BASOSABS 0.0 01/08/2014 0939    BMET    Component Value Date/Time   NA 140 03/31/2014 0403   K 4.8 03/31/2014 0403   CL 100 03/31/2014 0403   CO2 27 03/31/2014 0403   GLUCOSE 167* 03/31/2014 0403   BUN 26* 03/31/2014 0403   CREATININE 1.27 03/31/2014 0403   CREATININE 1.31 03/11/2014 1030   CALCIUM 8.3* 03/31/2014 0403   GFRNONAA 50* 03/31/2014 0403   GFRAA 58* 03/31/2014 0403    CMP     Component Value Date/Time  NA 140 03/31/2014 0403   K 4.8 03/31/2014 0403   CL 100 03/31/2014 0403   CO2 27 03/31/2014 0403   GLUCOSE 167* 03/31/2014 0403   BUN 26* 03/31/2014 0403   CREATININE 1.27 03/31/2014 0403   CREATININE 1.31 03/11/2014 1030   CALCIUM 8.3* 03/31/2014 0403   PROT 6.2 03/11/2014 1030   ALBUMIN 3.7 03/11/2014 1030   AST 29 03/11/2014 1030   ALT 36 03/11/2014 1030   ALKPHOS 45 03/11/2014 1030   BILITOT 0.4 03/11/2014 1030   GFRNONAA 50* 03/31/2014 0403   GFRAA 58* 03/31/2014 0403    Assessment and Plan: 1. Code Status:  DNR/DNI-comfort is main focus of care  2. Symptom Control:  Recommend 1. Anxiety/Agitation: Ativan 1 mg PO every 6 hrs prn 2. Pain/Dyspnea: Roxanol 5 mg PO every 2 hrs prn  3. Psycho/Social:  Emotional support offered to patient and his family, all understand the overall long term poor prognosis  4. Spiritual  Strong community church support  5. Disposition:  Likely back to [facility].  Facility is  working closely with family to provide him the best holistic care within his goals for himself.  Patient needs a hospice approach. 6.   Patient Documents Completed or Given: Document Given Completed  Advanced Directives Pkt    MOST yes   DNR yes   Gone from [facility]    Hard Choices yes     Time In Time Out Total Time Spent with Patient Total Overall Time  1300 1335 35 min 35 min    Greater than 50%  of this time was spent counseling and coordinating care related to the above assessment and plan.  Wadie Lessen NP  Palliative Medicine Team Team Phone # [phone] Pager [phone]   1

## 2014-04-02 NOTE — Progress Notes (Signed)
CSW continuing to follow for disposition planning.  Pt admitted from [facility] and plans to discharge to SNF level of care at [facility] when medically stable.  CSW spoke to admissions at [facility] who stated that they have spoken with daughter and daughter would like for pt to go to SNF level at [facility] with palliative care services to follow.  CSW reviewed chart and noted that pt currently on non-rebreather and this will not be able to be managed at SNF level of care.  [facility] recommended that pt have PT consult if pt able to participate.  CSW to continue to follow to assist with pt discharge planning needs.   Alison Murray, MSW, [place] [facility] [phone]

## 2014-04-02 NOTE — Progress Notes (Addendum)
ANTIBIOTIC CONSULT NOTE - follow-up  Pharmacy Consult for Vancomycin/Cefepime Indication: pneumonia  Allergies  Allergen Reactions  . Sulfamethoxazole Other (See Comments)    REACTION: "washes" him out per pt    Patient Measurements: Height: 5\' 10"  (177.8 cm) Weight: 207 lb 10.8 oz (94.2 kg) IBW/kg (Calculated) : 73   Vital Signs: Temp: 97.7 F (36.5 C) (07/07 0800) Temp src: Oral (07/07 0800) BP: 148/70 mmHg (07/07 0400) Pulse Rate: 85 (07/07 0400) Intake/Output from previous day: 07/06 0701 - 07/07 0700 In: 1200 [P.O.:600; IV Piggyback:600] Out: 1900 [Urine:1900] Intake/Output from this shift: Total I/O In: -  Out: 475 [Urine:475]  Labs:  Recent Labs  03/31/14 0403  WBC 7.1  HGB 11.0*  PLT 284  CREATININE 1.27   Estimated Creatinine Clearance: 49 ml/min (by C-G formula based on Cr of 1.27). No results found for this basename: VANCOTROUGH, VANCOPEAK, VANCORANDOM, GENTTROUGH, GENTPEAK, GENTRANDOM, TOBRATROUGH, TOBRAPEAK, TOBRARND, AMIKACINPEAK, AMIKACINTROU, AMIKACIN,  in the last 72 hours   Microbiology: Recent Results (from the past 720 hour(s))  CULTURE, BLOOD (ROUTINE X 2)     Status: None   Collection Time    03/30/14 11:35 AM      Result Value Ref Range Status   Specimen Description BLOOD LEFT ANTECUBITAL   Final   Special Requests BOTTLES DRAWN AEROBIC ONLY 3CC   Final   Culture  Setup Time     Final   Value: 03/30/2014 18:30     Performed at [facility]   Culture     Final   Value: STAPHYLOCOCCUS SPECIES (COAGULASE NEGATIVE)     Note: THE SIGNIFICANCE OF ISOLATING THIS ORGANISM FROM A SINGLE SET OF BLOOD CULTURES WHEN MULTIPLE SETS ARE DRAWN IS UNCERTAIN. PLEASE NOTIFY THE MICROBIOLOGY DEPARTMENT WITHIN ONE WEEK IF SPECIATION AND SENSITIVITIES ARE REQUIRED.     Note: Gram Stain Report Called to,Read Back By and Verified With: MIKE MORGAN 03/31/14 @ 7:31PM BY RUSCOE A.     Performed at [facility]   Report Status 04/02/2014 FINAL    Final  CULTURE, BLOOD (ROUTINE X 2)     Status: None   Collection Time    03/30/14 11:36 AM      Result Value Ref Range Status   Specimen Description BLOOD RIGHT ANTECUBITAL   Final   Special Requests BOTTLES DRAWN AEROBIC AND ANAEROBIC [facility] EACH   Final   Culture  Setup Time     Final   Value: 03/30/2014 18:30     Performed at [facility]   Culture     Final   Value:        BLOOD CULTURE RECEIVED NO GROWTH TO DATE CULTURE WILL BE HELD FOR 5 DAYS BEFORE ISSUING A FINAL NEGATIVE REPORT     Performed at [facility]   Report Status PENDING   Incomplete  MRSA PCR SCREENING     Status: None   Collection Time    03/30/14  3:38 PM      Result Value Ref Range Status   MRSA by PCR NEGATIVE  NEGATIVE Final   Comment:            The GeneXpert MRSA Assay (FDA     approved for NASAL specimens     only), is one component of a     comprehensive MRSA colonization     surveillance program. It is not     intended to diagnose MRSA     infection nor to guide or  monitor treatment for     MRSA infections.  CULTURE, EXPECTORATED SPUTUM-ASSESSMENT     Status: None   Collection Time    03/31/14  7:00 AM      Result Value Ref Range Status   Specimen Description SPUTUM   Final   Special Requests Normal   Final   Sputum evaluation     Final   Value: MICROSCOPIC FINDINGS SUGGEST THAT THIS SPECIMEN IS NOT REPRESENTATIVE OF LOWER RESPIRATORY SECRETIONS. PLEASE RECOLLECT.     CALLED TO DENISE AT [phone] ON [id number] BY HOOKER,B   Report Status 03/31/2014 FINAL   Final    Medical History: Past Medical History  Diagnosis Date  . Chronic airway obstruction, not elsewhere classified   . Unspecified essential hypertension   . Cerebrovascular disease, unspecified   . Other and unspecified hyperlipidemia   . Diaphragmatic hernia without mention of obstruction or gangrene   . Diverticulosis of colon (without mention of hemorrhage)   . Benign neoplasm of colon   . Overweight(278.02)   . On  home oxygen therapy   . H/O tobacco use, presenting hazards to health 01/25/2013  . Edema 04/18/2013  . OSA on CPAP   . Peripheral vascular disease     hx of DVT stopped Coumadin 06/05/2013   . Cancer     kidney cancer   . Arthritis   . Polycythemia   . DVT (deep venous thrombosis)     2014   . Pulmonary embolism   . 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 Renal insufficiency 12/23/2013     Assessment: 23 yoM presents with shortness of breath. Patient was recently admitted for 4 days on 02/26/14 for CAP and acute on chronic respiratory failure. Beginning Cefepime and Vancomycin for HCAP.  7/4 >> Cefepime >> 7/4 >> Vanc >> 7/7 7/4>> Levaquin (MD) >>  Tmax: afebrile WBC: WNL Renal: SCr 1.27   7/4 Blood: CoNS 7/5 Sputum: not adequate specimen 7/5 MRSA screen neg   Goal of Therapy:  Vancomycin trough level 15-20 mcg/ml  Plan:  Day #4 antibiotics  Orders to d/c vancomycin for likely contaminant in blood cx  Cefepime 1gm IV q8h appropriate at this time (adjust if Scr worsens)  Continue levofloxacin 750mg  IV q24h (adjust if Scr worsens)  F/u narrow to levofloxacin alone when appropriate.   Doreene Eland, PharmD, BCPS.   Pager: [phone]  04/02/2014,12:21 PM

## 2014-04-02 NOTE — Care Management Note (Addendum)
    Page 1 of 1   04/03/2014     11:20:44 AM CARE MANAGEMENT NOTE 04/03/2014  Patient:  Tracy [name], Tracy [name]   Account Number:  [id number]  Date Initiated:  04/02/2014  Documentation initiated by:  Dessa Phi  Subjective/Objective Assessment:   80 Y/O M ADMITTED W/COPD.READMIT 6/2-03/02/14-PNA.     Action/Plan:   FROM [facility]-INDEP LIV.HAS HOME 02-[facility],CANE,RW,3N1.PCP-DR. STACEY BLYTH.   Anticipated DC Date:  04/08/2014   Anticipated DC Plan:  [place]  CM consult      Choice offered to / List presented to:             Status of service:  In process, will continue to follow Medicare Important Message given?  YES (If response is "NO", the following Medicare IM given date fields will be blank) Date Medicare IM given:  03/30/2014 Medicare IM given by:   Date Additional Medicare IM given:  04/03/2014 Additional Medicare IM given by:  [facility] Korbyn Vanes  Discharge Disposition:    Per UR Regulation:  Reviewed for med. necessity/level of care/duration of stay  If discussed at [place] of Stay Meetings, dates discussed:    Comments:  04/03/14 09:15 CM notes pt is on Non-rebreather with plan to attempt weaning off today.  CSW arranging placement.  No other CM needs were communicated.  Mariane Masters, BSN, [place].  04/02/14 KATHY MAHABIR RN,BSN NCM [place] LEVEL OF CARE.RECOMMEND PT CONS.

## 2014-04-02 NOTE — Progress Notes (Signed)
Full report given to 3W RN. VSS on transfer.

## 2014-04-02 NOTE — Progress Notes (Signed)
[facility] PROGRESS NOTE  KIREE DEJARNETTE XHB:[id number] DOB: 06/06/1926 DOA: 03/30/2014 PCP: Penni Homans, MD   Brief narrative 80 year old male with history of advanced COPD on home O2 (8-10 L via nasal cannula), obstructive sleep apnea, recent hospitalization for respiratory failure with COPD exacerbation and bilateral lower lobe pneumonia, recently diagnosed MAT treated with Cardizem and plan for outpatient event monitor presented to the ED with 2 weeks of worsened dyspnea on exertion and increased leg swelling. Patient admitted with acute on chronic respiratory failure secondary to underlying advanced COPD.   Assessment/Plan  Acute on chronic respiratory failure  Secondary to acute on chronic COPD exacerbation and underlying OSA. -admitted to stepdown. Patient still requiring NRB. At home he is on 9-10 L via [place] and satting at 88-90% -Continue IV solumederol 80 mg q12 hr. Reduce lasix to 40 mg po bid. - scheduled duoneb q4hr. Albuterol nebs every 2 hours as needed. Added empiric levaquin 750 mg q24hr. Follow sputum culture.  -Pulmonary consult appreciated. Plan to keep o2 sat >85 %. Given chronicity and severe COPD palliative care consulted for [employer]. Patient and family are aware of chronicity of illness and progressive disease and opt for discharge back to [facility] with palliative care follow up.. patient needs to be stable on high flow [place] for discharge to SNF. -transferred out to medical floor.   Active Problems:  OBSTRUCTIVE SLEEP APNEA  On CPAP at night   ? Bacteremia Coagulase negative staph 1/2 blood cx. like contaminant. Will d/c vancomycin  Hemoptysis  Likely due to bronchitis . negative sputum culture.   Recent MAT  HR stable. Continue cardizem   Polycythemia  Follows with Dr Marin Olp as outpt   CVA  Continue aspirin and statin   Diet:cardiac  DVT prophylaxis: sq lovenox   Code Status: DNR  Family Communication: called son and updated Disposition Plan:  SNF with palliative care follow up   HPI/Subjective: Patient seen and examined this morning. Still requires NRB  Objective: Filed Vitals:   04/02/14 0800  BP:   Pulse:   Temp: 97.7 F (36.5 C)  Resp:     Intake/Output Summary (Last 24 hours) at 04/02/14 1454 Last data filed at 04/02/14 0900  Gross per 24 hour  Intake    550 ml  Output   1725 ml  Net  -1175 ml   Filed Weights   03/31/14 0400 04/01/14 0400 04/02/14 0500  Weight: 96.5 kg (212 lb 11.9 oz) 95 kg (209 lb 7 oz) 94.2 kg (207 lb 10.8 oz)    Exam:   General:  Elderly obese male NRB. Not in distress  HEENT: No pallor, moist oral mucosa  Cardiovascular: Normal S1 and S2, no murmurs rub or gallop  Respiratory: clear bilaterally, no added sounds  Abdomen: Soft, Nontender, nondistended, bowel sounds present  Musculoskeletal: Warm, no edema  CNS: AAO x3  Data Reviewed: Basic Metabolic Panel:  Recent Labs Lab 03/30/14 1122 03/31/14 0403  NA 143 140  K 4.7 4.8  CL 101 100  CO2 31 27  GLUCOSE 125* 167*  BUN 26* 26*  CREATININE 1.33 1.27  CALCIUM 8.8 8.3*   Liver Function Tests: No results found for this basename: AST, ALT, ALKPHOS, BILITOT, PROT, ALBUMIN,  in the last 168 hours No results found for this basename: LIPASE, AMYLASE,  in the last 168 hours No results found for this basename: AMMONIA,  in the last 168 hours CBC:  Recent Labs Lab 03/30/14 1122 03/31/14 0403  WBC 8.1 7.1  NEUTROABS 6.1  --   HGB 12.2* 11.0*  HCT 39.6 35.6*  MCV 90.4 89.9  PLT 297 284   Cardiac Enzymes:  Recent Labs Lab 03/30/14 1122  TROPONINI <0.30   BNP (last 3 results)  Recent Labs  03/30/14 1125  PROBNP 686.1*   CBG: No results found for this basename: GLUCAP,  in the last 168 hours  Recent Results (from the past 240 hour(s))  CULTURE, BLOOD (ROUTINE X 2)     Status: None   Collection Time    03/30/14 11:35 AM      Result Value Ref Range Status   Specimen Description BLOOD LEFT ANTECUBITAL    Final   Special Requests BOTTLES DRAWN AEROBIC ONLY 3CC   Final   Culture  Setup Time     Final   Value: 03/30/2014 18:30     Performed at [facility]   Culture     Final   Value: STAPHYLOCOCCUS SPECIES (COAGULASE NEGATIVE)     Note: THE SIGNIFICANCE OF ISOLATING THIS ORGANISM FROM A SINGLE SET OF BLOOD CULTURES WHEN MULTIPLE SETS ARE DRAWN IS UNCERTAIN. PLEASE NOTIFY THE MICROBIOLOGY DEPARTMENT WITHIN ONE WEEK IF SPECIATION AND SENSITIVITIES ARE REQUIRED.     Note: Gram Stain Report Called to,Read Back By and Verified With: MIKE MORGAN 03/31/14 @ 7:31PM BY RUSCOE A.     Performed at [facility]   Report Status 04/02/2014 FINAL   Final  CULTURE, BLOOD (ROUTINE X 2)     Status: None   Collection Time    03/30/14 11:36 AM      Result Value Ref Range Status   Specimen Description BLOOD RIGHT ANTECUBITAL   Final   Special Requests BOTTLES DRAWN AEROBIC AND ANAEROBIC [facility] EACH   Final   Culture  Setup Time     Final   Value: 03/30/2014 18:30     Performed at [facility]   Culture     Final   Value:        BLOOD CULTURE RECEIVED NO GROWTH TO DATE CULTURE WILL BE HELD FOR 5 DAYS BEFORE ISSUING A FINAL NEGATIVE REPORT     Performed at [facility]   Report Status PENDING   Incomplete  MRSA PCR SCREENING     Status: None   Collection Time    03/30/14  3:38 PM      Result Value Ref Range Status   MRSA by PCR NEGATIVE  NEGATIVE Final   Comment:            The GeneXpert MRSA Assay (FDA     approved for NASAL specimens     only), is one component of a     comprehensive MRSA colonization     surveillance program. It is not     intended to diagnose MRSA     infection nor to guide or     monitor treatment for     MRSA infections.  CULTURE, EXPECTORATED SPUTUM-ASSESSMENT     Status: None   Collection Time    03/31/14  7:00 AM      Result Value Ref Range Status   Specimen Description SPUTUM   Final   Special Requests Normal   Final   Sputum evaluation      Final   Value: MICROSCOPIC FINDINGS SUGGEST THAT THIS SPECIMEN IS NOT REPRESENTATIVE OF LOWER RESPIRATORY SECRETIONS. PLEASE RECOLLECT.     CALLED TO DENISE AT [phone] ON [id number] BY HOOKER,B   Report Status 03/31/2014  FINAL   Final     Studies: No results found.  Scheduled Meds: . antiseptic oral rinse  15 mL Mouth Rinse BID  . aspirin EC  81 mg Oral q morning - 10a  . atorvastatin  20 mg Oral QHS  . ceFEPime (MAXIPIME) IV  1 g Intravenous 3 times per day  . dextromethorphan-guaiFENesin  1 tablet Oral BID  . diltiazem  180 mg Oral QHS  . enoxaparin (LOVENOX) injection  40 mg Subcutaneous Q24H  . furosemide  40 mg Intravenous BID  . ipratropium-albuterol  3 mL Nebulization Q4H  . levofloxacin (LEVAQUIN) IV  750 mg Intravenous Q24H  . methylPREDNISolone (SOLU-MEDROL) injection  80 mg Intravenous 3 times per day  . mometasone-formoterol  2 puff Inhalation BID  . omega-3 acid ethyl esters  1 g Oral Daily  . pantoprazole  40 mg Oral Daily  . psyllium  1 packet Oral Daily  . sodium chloride  3 mL Intravenous Q12H   Continuous Infusions:     Time spent: 25 minutes    Kerman Pfost, [place]  [facility] Pager [phone] If 7PM-7AM, please contact night-coverage at [email or web address], password [facility] 04/02/2014, 2:54 PM  LOS: 3 days

## 2014-04-02 NOTE — Progress Notes (Signed)
Called to PT bedside by RN. Pt Sp02 = 77. Replaced flap on NRB to increase Fi02. Reassured Pt that Sp02 would increase as it did while in ICU. Sp02 increased to 88%. Pt thanked RT.

## 2014-04-03 ENCOUNTER — Telehealth: Payer: Self-pay | Admitting: Pulmonary Disease

## 2014-04-03 DIAGNOSIS — Z7982 Long term (current) use of aspirin: Secondary | ICD-10-CM

## 2014-04-03 DIAGNOSIS — D751 Secondary polycythemia: Secondary | ICD-10-CM

## 2014-04-03 DIAGNOSIS — Z86718 Personal history of other venous thrombosis and embolism: Secondary | ICD-10-CM

## 2014-04-03 DIAGNOSIS — R0602 Shortness of breath: Secondary | ICD-10-CM

## 2014-04-03 DIAGNOSIS — Z86711 Personal history of pulmonary embolism: Secondary | ICD-10-CM

## 2014-04-03 MED ORDER — METHYLPREDNISOLONE SODIUM SUCC 40 MG IJ SOLR
40.0000 mg | Freq: Two times a day (BID) | INTRAMUSCULAR | Status: DC
  Administered 2014-04-04 (×2): 40 mg via INTRAVENOUS
  Filled 2014-04-03 (×2): qty 1

## 2014-04-03 MED ORDER — ASPIRIN EC 325 MG PO TBEC
325.0000 mg | DELAYED_RELEASE_TABLET | Freq: Every morning | ORAL | Status: DC
  Administered 2014-04-04: 325 mg via ORAL
  Filled 2014-04-03: qty 1

## 2014-04-03 NOTE — Progress Notes (Signed)
OT Cancellation Note  Patient Details Name: SHIGERU LAMPERT MRN: [id number] DOB: [birth year]   Cancelled Treatment:    Reason Eval/Treat Not Completed: Other (comment)  Pt is on a NRB and is not ready for OT at this time. Will check back tomorrow.  Mystic Labo 04/03/2014, 2:14 PM Lesle Chris, OTR/L [phone] 04/03/2014

## 2014-04-03 NOTE — Progress Notes (Signed)
Patient ID: Tracy [name], male   DOB: 01/18/1926, 80 y.o.   MRN: [id number]  [facility] PROGRESS NOTE  Tracy [name] QVZ:[id number] DOB: 1926/09/05 DOA: 03/30/2014 PCP: Penni Homans, MD  Brief narrative: 80 year old male with history of advanced COPD on home O2 (8-10 L via nasal cannula), obstructive sleep apnea, recent hospitalization for respiratory failure with COPD exacerbation and bilateral lower lobe pneumonia, recently diagnosed MAT treated with Cardizem and plan for outpatient event monitor, presented to the ED with 2 weeks of worsened dyspnea on exertion and increased leg swelling. Patient admitted with acute on chronic respiratory failure secondary to underlying advanced COPD.   Assessment/Plan  Acute on chronic respiratory failure  Secondary to acute on chronic COPD exacerbation and underlying OSA.  - admitted to stepdown. At home he is on 9-10 L via [place] and satting at 88-90%  - continue IV solumedrol and plan on tapering down prior to discharge  - scheduled duoneb q4hr.  - currently receiving empiric Levaquin and Maxipime, continue to follow up sputum cultures  - Given chronicity and severe COPD, PCT consulted for [place]. - Patient aware of progressive nature of the illness and deterioration and opt for discharge back to [facility] with PCT F/U Active Problems:  OBSTRUCTIVE SLEEP APNEA  - continue CPAP at night -  ? Bacteremia  - Coagulase negative staph 1/2 blood cx. Likely contaminant - off vancomycin  Hemoptysis  - Likely due to bronchitis . negative sputum culture.  - No events overnight  Recent MAT  - HR stable. Continue cardizem  Polycythemia  - Follows with Dr Marin Olp as outpt  - appreciate his input  CVA  - Continue aspirin and statin  - will increase the dose of aspirin   Diet:cardiac  DVT prophylaxis: sq lovenox   Consultants:  Oncology   PCCM   Procedures/Studies:  None  Antibiotics:  Maxipime 7/4 -->  Levaquin 7/4 -->  Code Status:  DNR Family Communication: Pt at bedside Disposition Plan: Home when medically stable  HPI/Subjective: No events overnight.   Objective: Filed Vitals:   04/03/14 0650 04/03/14 0759 04/03/14 1227 04/03/14 1237  BP: 127/74     Pulse: 79     Temp: 97.8 F (36.6 C)     TempSrc: Oral     Resp: 20     Height:      Weight:      SpO2: 96% 95% 87% 84%    Intake/Output Summary (Last 24 hours) at 04/03/14 1459 Last data filed at 04/03/14 1159  Gross per 24 hour  Intake    240 ml  Output   1225 ml  Net   -985 ml    Exam:   General:  Pt is alert, follows commands appropriately, in mild distress due to cough   Cardiovascular: Regular rate and rhythm, S1/S2, no murmurs, no rubs, no gallops  Respiratory: Clear to auscultation bilaterally, diminished air movement at bases   Abdomen: Soft, non tender, non distended, bowel sounds present, no guarding  Extremities: pulses DP and PT palpable bilaterally  Neuro: Grossly nonfocal  Data Reviewed: Basic Metabolic Panel:  Recent Labs Lab 03/30/14 1122 03/31/14 0403  NA 143 140  K 4.7 4.8  CL 101 100  CO2 31 27  GLUCOSE 125* 167*  BUN 26* 26*  CREATININE 1.33 1.27  CALCIUM 8.8 8.3*   CBC:  Recent Labs Lab 03/30/14 1122 03/31/14 0403  WBC 8.1 7.1  NEUTROABS 6.1  --   HGB 12.2* 11.0*  HCT  39.6 35.6*  MCV 90.4 89.9  PLT 297 284   Cardiac Enzymes:  Recent Labs Lab 03/30/14 1122  TROPONINI <0.30   Recent Results (from the past 240 hour(s))  CULTURE, BLOOD (ROUTINE X 2)     Status: None   Collection Time    03/30/14 11:35 AM      Result Value Ref Range Status   Specimen Description BLOOD LEFT ANTECUBITAL   Final   Special Requests BOTTLES DRAWN AEROBIC ONLY 3CC   Final   Culture  Setup Time     Final   Value: 03/30/2014 18:30     Performed at [facility]   Culture     Final   Value: STAPHYLOCOCCUS SPECIES (COAGULASE NEGATIVE)     Note: THE SIGNIFICANCE OF ISOLATING THIS ORGANISM FROM A SINGLE SET OF  BLOOD CULTURES WHEN MULTIPLE SETS ARE DRAWN IS UNCERTAIN. PLEASE NOTIFY THE MICROBIOLOGY DEPARTMENT WITHIN ONE WEEK IF SPECIATION AND SENSITIVITIES ARE REQUIRED.     Note: Gram Stain Report Called to,Read Back By and Verified With: MIKE MORGAN 03/31/14 @ 7:31PM BY RUSCOE A.     Performed at [facility]   Report Status 04/02/2014 FINAL   Final  CULTURE, BLOOD (ROUTINE X 2)     Status: None   Collection Time    03/30/14 11:36 AM      Result Value Ref Range Status   Specimen Description BLOOD RIGHT ANTECUBITAL   Final   Special Requests BOTTLES DRAWN AEROBIC AND ANAEROBIC [facility] EACH   Final   Culture  Setup Time     Final   Value: 03/30/2014 18:30     Performed at [facility]   Culture     Final   Value:        BLOOD CULTURE RECEIVED NO GROWTH TO DATE CULTURE WILL BE HELD FOR 5 DAYS BEFORE ISSUING A FINAL NEGATIVE REPORT     Performed at [facility]   Report Status PENDING   Incomplete  MRSA PCR SCREENING     Status: None   Collection Time    03/30/14  3:38 PM      Result Value Ref Range Status   MRSA by PCR NEGATIVE  NEGATIVE Final   Comment:            The GeneXpert MRSA Assay (FDA     approved for NASAL specimens     only), is one component of a     comprehensive MRSA colonization     surveillance program. It is not     intended to diagnose MRSA     infection nor to guide or     monitor treatment for     MRSA infections.  CULTURE, EXPECTORATED SPUTUM-ASSESSMENT     Status: None   Collection Time    03/31/14  7:00 AM      Result Value Ref Range Status   Specimen Description SPUTUM   Final   Special Requests Normal   Final   Sputum evaluation     Final   Value: MICROSCOPIC FINDINGS SUGGEST THAT THIS SPECIMEN IS NOT REPRESENTATIVE OF LOWER RESPIRATORY SECRETIONS. PLEASE RECOLLECT.     CALLED TO DENISE AT [phone] ON [id number] BY HOOKER,B   Report Status 03/31/2014 FINAL   Final     Scheduled Meds: . aspirin EC  81 mg Oral q morning - 10a  . atorvastatin  20  mg Oral QHS  . ceFEPime  IV  1 g Intravenous 3 times per  day  . dextromethorphan  1 tablet Oral BID  . diltiazem  180 mg Oral QHS  . enoxaparin injection  40 mg Subcutaneous Q24H  . furosemide  40 mg Oral BID  . ipratropium-albuterol  3 mL Nebulization Q4H  . levofloxacin IV  750 mg Intravenous Q24H  . methylPREDNISolone inj  80 mg Intravenous Q12H  . mometasone-formoterol  2 puff Inhalation BID  . pantoprazole  40 mg Oral Daily   Continuous Infusions:    Faye Ramsay, MD  [facility] Pager [phone]  If 7PM-7AM, please contact night-coverage [email or web address] Password [email or web address] 04/03/2014, 2:59 PM   LOS: 4 days

## 2014-04-03 NOTE — Consult Note (Signed)
Tracy Ramsay, MD  [facility] Pager [phone]  If 7PM-7AM, please contact night-coverage [email or web address] Password [email or web address]

## 2014-04-03 NOTE — Consult Note (Signed)
#   [id number] is consult note.  Tracy [name] 1:12

## 2014-04-03 NOTE — Progress Notes (Signed)
RT worked with PT on deep breathing and coughing.

## 2014-04-03 NOTE — Progress Notes (Signed)
PT Cancellation Note  Patient Details Name: Tracy [name] MRN: [id number] DOB: 08-Apr-1926   Cancelled Treatment:    Reason Eval/Treat Not Completed: Medical issues which prohibited therapy Pt placed on NRB earlier and per RN hold therapy today, check back tomorrow.   Kenedee Molesky,KATHrine E 04/03/2014, 2:20 PM Carmelia Bake, PT, DPT 04/03/2014 Pager: [phone]

## 2014-04-03 NOTE — Consult Note (Signed)
Tracy [name], [name]                ACCOUNT NO.:  [id number]  MEDICAL RECORD NO.:  [id number]  LOCATION:  6834                         FACILITY:  [place]:  Volanda Napoleon, M.D.  DATE OF BIRTH:  1926-01-24  DATE OF CONSULTATION:  04/03/2014 DATE OF DISCHARGE:                                CONSULTATION   REFERRING PHYSICIAN:  Dr. Doyle Askew.  REASON FOR CONSULTATION: 1. Secondary polycythemia. 2. 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  HISTORY OF PRESENT ILLNESS:  Tracy [name] is an 80 year old gentleman.  He has severe COPD.  I have been seeing him for a history of secondary polycythemia.  He gets phlebotomized on occasion. He also has a past history of idiopathic pulmonary embolism and DVT of the right leg.  He was on Coumadin for 1 year.  He currently is on aspirin at 162 mg a day.  He was admitted because of worsening shortness of breath.  He had bilateral pneumonia.  CT angiogram was negative for any pulmonary embolism.  He did have a repeat Doppler of his legs in June of this year.  There is no evidence of thromboembolic disease in the right leg.  When he was admitted, his hemoglobin was 12.2 with hematocrit 39.6.  He is improving slowly.  He is on IV antibiotics.  He has had no bleeding.  He has had no nausea or vomiting.  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  He sees Dr. Kathie Rhodes of Urology for this.  PAST MEDICAL HISTORY: 1. Remarkable for severe COPD. 2. Hypertension. 3. Hyperlipidemia. 4. Diverticulosis. 5. Obstructive sleep apnea. 6. Past history of kidney cancer. 7. History of deep venous thrombosis of the right leg. 8. Pulmonary embolism. 9. History of secondary polycythemia.  ALLERGIES:  To SULFA medications.  MEDICATIONS:  His admission medications are stated in the chart.  SOCIAL HISTORY:  Negative for tobacco use.  He did have past tobacco use.  He has about a 55 pack year history of tobacco  use.  He stopped back in 1980.  He has no alcohol use.  PHYSICAL EXAMINATION:  GENERAL:  This is an elderly gentleman, in no obvious distress.  He has his CPAP on. VITAL SIGNS:  Temperature of 97.8.  Pulse 79.  Blood pressure 127/74. HEAD AND NECK:  No ocular or oral lesions.  There are no palpable cervical or supraclavicular lymph nodes. LUNGS:  Decent breath sounds bilaterally.  He has some bilateral crackles.  An occasional wheeze is noted. CARDIAC:  Regular rate and rhythm with no murmurs, rubs, or bruits. ABDOMEN:  Soft.  He is mildly obese.  He has good bowel sounds.  There is no fluid wave.  There is no palpable liver or spleen tip. EXTREMITIES:  No clubbing, cyanosis, or edema.  No obvious venous cord is noted.  He has decent range of motion of his joints. NEUROLOGICAL:  Nonfocal.  IMPRESSION:  Tracy [name] is an 80 year old gentleman with a history of severe chronic obstructive pulmonary disease.  He has secondary polycythemia.  He has history of pulmonary embolism and deep vein thrombosis.  He is on aspirin right now.  He  certainly does not need to be phlebotomized.  He is on low-dose Lovenox for DVT prophylaxis.  This certainly is reasonable.  He is on 81 mg of aspirin.  I would make sure that he is on 162 mg of aspirin.  We will be having to follow along and help out in any way possible.  Tracy [name] is a very nice guy.  We will pray hard for him.     Volanda Napoleon, M.D.     PRE/MEDQ  D:  04/03/2014  T:  04/03/2014  Job:  [id number]

## 2014-04-03 NOTE — Progress Notes (Signed)
Pt placed on home CPAP with 12 LPM O2 bleed in.  Pt tolerating well at this time, RT to monitor and assess as needed.

## 2014-04-03 NOTE — Progress Notes (Signed)
CSW continuing to follow for disposition planning.  Pt plans to return to [facility] level of care upon discharge.  Pt requiring non-re breather mask with high flow of oxygen. CSW contacted [facility] and send updated clinical information. Per [facility], facility can manage this level of oxygen need.  CSW met with pt at bedside to update pt that CSW is communicating with [facility] regarding his return. Pt expressed appreciation for visit and agreeable to CSW contacting pt daughter, Anderson [place] via telephone.  CSW contacted pt daughter, Anderson [place] via telephone. Pt daughter discussed that she has been in communication with [facility] and has been informed by [facility] that facility can meet pt oxygen requirements and that facility has a MD with palliative care that comes to the facility 4 days a week. Pt daughter requested for CSW to verify the information regarding the palliative care MD as pt daughter wants to ensure that pt symptoms can be managed at [facility]. CSW expressed understanding and discussed with pt daughter that CSW will be in communication with [facility] to verify this information. CSW discussed that CSW will also make sure that [facility] has notes from palliative care team from hospital to assist in management of pt care. Pt daughter very appreciative for support and assistance.  CSW to follow up with [facility] tomorrow morning.  CSW to continue to follow and facilitate pt discharge needs when pt medically ready for discharge.  Alison Murray, MSW, [place] [facility] [phone]

## 2014-04-03 NOTE — Telephone Encounter (Signed)
Will need to fax back to [employer] at [phone] once signed by RA.

## 2014-04-04 LAB — BASIC METABOLIC PANEL
ANION GAP: 13 (ref 5–15)
BUN: 41 mg/dL — ABNORMAL HIGH (ref 6–23)
CO2: 29 mEq/L (ref 19–32)
Calcium: 8.4 mg/dL (ref 8.4–10.5)
Chloride: 97 mEq/L (ref 96–112)
Creatinine, Ser: 1.28 mg/dL (ref 0.50–1.35)
GFR, EST AFRICAN AMERICAN: 57 mL/min — AB (ref >90)
GFR, EST NON AFRICAN AMERICAN: 49 mL/min — AB (ref >90)
Glucose, Bld: 162 mg/dL — ABNORMAL HIGH (ref 70–99)
POTASSIUM: 3.9 meq/L (ref 3.7–5.3)
Sodium: 139 mEq/L (ref 137–147)

## 2014-04-04 LAB — CBC
HCT: 37.9 % — ABNORMAL LOW (ref 39.0–52.0)
Hemoglobin: 12.2 g/dL — ABNORMAL LOW (ref 13.0–17.0)
MCH: 27.9 pg (ref 26.0–34.0)
MCHC: 32.2 g/dL (ref 30.0–36.0)
MCV: 86.7 fL (ref 78.0–100.0)
PLATELETS: 416 10*3/uL — AB (ref 150–400)
RBC: 4.37 MIL/uL (ref 4.22–5.81)
RDW: 18.1 % — AB (ref 11.5–15.5)
WBC: 12.6 10*3/uL — ABNORMAL HIGH (ref 4.0–10.5)

## 2014-04-04 MED ORDER — ALBUTEROL SULFATE (2.5 MG/3ML) 0.083% IN NEBU: 2.5000 mg | INHALATION_SOLUTION | RESPIRATORY_TRACT | Status: DC | PRN

## 2014-04-04 MED ORDER — LEVOFLOXACIN 500 MG PO TABS: 500.0000 mg | ORAL_TABLET | Freq: Every day | ORAL | Status: DC

## 2014-04-04 MED ORDER — IPRATROPIUM-ALBUTEROL 0.5-2.5 (3) MG/3ML IN SOLN: 3.0000 mL | Freq: Four times a day (QID) | RESPIRATORY_TRACT | Status: DC

## 2014-04-04 MED ORDER — PANTOPRAZOLE SODIUM 40 MG PO TBEC: 40.0000 mg | DELAYED_RELEASE_TABLET | Freq: Every day | ORAL | Status: AC

## 2014-04-04 MED ORDER — PREDNISONE 50 MG PO TABS: ORAL_TABLET | ORAL | Status: DC

## 2014-04-04 MED ORDER — IPRATROPIUM-ALBUTEROL 0.5-2.5 (3) MG/3ML IN SOLN: 3.0000 mL | Freq: Four times a day (QID) | RESPIRATORY_TRACT | Status: AC

## 2014-04-04 NOTE — Progress Notes (Signed)
PT Cancellation Note  Patient Details Name: Tracy [name] MRN: [id number] DOB: 12-Oct-1925   Cancelled Treatment:    Reason Eval/Treat Not Completed: Other (comment) Discussed physical and occupational therapy at bedside with pt and he reports he would prefer to start therapy at SNF if he will be discharged today to conserve his energy.  He appears agreeable to therapy just does not wish to over-exert himself day of discharge due to known saturations dropping with activity per pt report.  Also discussed d/c plan with CSW Alison Murray) and she will page if PT evaluation if required for insurance.   Tabb Croghan,KATHrine E 04/04/2014, 10:54 AM Carmelia Bake, PT, DPT 04/04/2014 Pager: [phone]

## 2014-04-04 NOTE — Progress Notes (Signed)
OT Cancellation Note  Patient Details Name: DARBY SHADWICK MRN: [id number] DOB: 1926/09/16   Cancelled Treatment:    Reason Eval/Treat Not Completed: Other (comment)  As per PT note, spoke to pt and he wishes to hold on OT today if he is to d/c today. Will check back if pt still here after today or if OT eval needed.   Jules Schick [phone] 04/04/2014, 11:05 AM

## 2014-04-04 NOTE — Progress Notes (Signed)
Pt for discharge to [facility].   CSW spoke with [facility] this morning and confirmed with facility that palliative MD will round at facility four days a week. [facility] stated that they will have two concentrators available at facility to meet pt oxygen needs of 15 L/min for non-re breather mask and 10 L/min nasal cannula.   CSW contacted pt daughter, Anderson Malta. CSW updated pt daughter regarding CSW clarifying with the facility regarding the palliative MD rounding at facility and discussed with pt daughter that [facility] has ordered additional equipment to ensure that pt oxygen needs are met at the facility. CSW notified pt daughter, Anderson Malta, that pt medically stable for discharge today. Pt daughter expressed understanding and appreciative of CSW update and advocacy for pt.  CSW updated pt at bedside. Pt eager to return to [facility] for continued care in order to be in an environment he is familiar with.   CSW facilitated pt discharge needs including contacting facility, faxing pt discharge information via [facility], discussing with pt at bedside and notifying pt daughter, Anderson [place] via telephone, left voice message. CSW provided RN phone number to call report and arranged ambulance transport via [facility].  Pt coping appropriately with transition to [facility] and is eager to return there and be back close to his wife.   No further social work needs identified at this time.  CSW signing off.   Alison Murray, MSW, [place] Work [phone]

## 2014-04-04 NOTE — Discharge Summary (Signed)
Physician Discharge Summary  Tracy [name]:[id number] DOB: 1926-06-14 DOA: 03/30/2014  PCP: Penni Homans, MD  Admit date: 03/30/2014 Discharge date: 04/04/2014  Recommendations for Outpatient Follow-up:  1. Pt will need to follow up with PCP in 2-3 weeks post discharge 2. Please obtain BMP to evaluate electrolytes and kidney function 3. Please also check CBC to evaluate Hg and Hct levels 4. Please note that pt needs to continue taking Levaquin for 5 more days post discharge, stop date is July 14th, 2015 5. Pt also discharged on Prednisone taper pack as noted below   Discharge Diagnoses: Acute on chronic severe COPD, end stage  Active Problems:   OBSTRUCTIVE SLEEP APNEA   HYPERTENSION, BORDERLINE   Acute respiratory failure with hypoxia   Edema   COPD (chronic obstructive pulmonary disease) with acute bronchitis   Respiratory failure, acute and chronic   Palliative care encounter  Discharge Condition: Stable  Diet recommendation: Heart healthy diet discussed in details   Brief narrative:  80 year old male with history of advanced COPD on home O2 (8-10 L via nasal cannula), obstructive sleep apnea, recent hospitalization for respiratory failure with COPD exacerbation and bilateral lower lobe pneumonia, recently diagnosed MAT treated with Cardizem and plan for outpatient event monitor, presented to the ED with 2 weeks of worsened dyspnea on exertion and increased leg swelling. Patient admitted with acute on chronic respiratory failure secondary to underlying advanced COPD.   Assessment/Plan  Acute on chronic respiratory failure  Secondary to acute on chronic COPD exacerbation and underlying OSA.  - admitted to stepdown. At home he is on 9-10 L via [place] and satting at 88-90%  - continued IV solumedrol and plan on tapering down at discharge, prednisone taper outlined below  - scheduled duoneb q4hr.  - currently receiving empiric Levaquin and Maxipime, stop maxipime and continue  Levaquin for 5 more days post discharge  - Given chronicity and severe COPD, PCT consulted for [place].  - Patient aware of progressive nature of the illness and deterioration and opt for discharge back to [facility] with PCT F/U  Active Problems:  OBSTRUCTIVE SLEEP APNEA  - continue CPAP at night -  ? Bacteremia  - Coagulase negative staph 1/2 blood cx. Likely contaminant  - off vancomycin  Hemoptysis  - Likely due to bronchitis . negative sputum culture.  - No events overnight  Recent MAT  - HR stable. Continue cardizem  Polycythemia  - Follows with Dr Marin Olp as outpt  - appreciate his input  CVA  - Continue aspirin and statin  - will increase the dose of aspirin    DVT prophylaxis: sq lovenox while inpatient   Consultants:  Oncology  PCCM  Procedures/Studies:  None Antibiotics:  Maxipime 7/4 --> 04/04/2014 Levaquin 7/4 --> continue until July 14th, 2015   Code Status: DNR  Family Communication: Pt at bedside   Discharge Exam: Filed Vitals:   04/04/14 0458  BP: 135/81  Pulse: 83  Temp: 97.8 F (36.6 C)  Resp: 20   Filed Vitals:   04/03/14 2110 04/04/14 0351 04/04/14 0458 04/04/14 0948  BP: 132/74  135/81   Pulse: 86  83   Temp: 98.5 F (36.9 C)  97.8 F (36.6 C)   TempSrc: Oral  Oral   Resp: 22  20   Height:      Weight:      SpO2: 91% 94% 97% 89%    General: Pt is alert, follows commands appropriately, not in acute distress Cardiovascular: Regular rate and  rhythm, S1/S2 +, no murmurs, no rubs, no gallops Respiratory: Clear to auscultation bilaterally, no wheezing, diminished breath sounds at bases  Abdominal: Soft, non tender, non distended, bowel sounds +, no guarding Extremities: no cyanosis, pulses palpable bilaterally DP and PT Neuro: Grossly nonfocal  Discharge Instructions  Discharge Instructions   Diet - low sodium heart healthy    Complete by:  As directed      Increase activity slowly    Complete by:  As directed              Medication List         albuterol (2.5 MG/3ML) 0.083% nebulizer solution  Commonly known as:  PROVENTIL  Take 3 mLs (2.5 mg total) by nebulization every 2 (two) hours as needed for wheezing or shortness of breath.     aspirin EC 81 MG tablet  Take 81 mg by mouth every morning.     benzonatate 100 MG capsule  Commonly known as:  TESSALON  Take 100 mg by mouth 3 (three) times daily as needed for cough.     dextromethorphan-guaiFENesin 30-600 MG per 12 hr tablet  Commonly known as:  MUCINEX DM  Take 1 tablet by mouth 2 (two) times daily.     diltiazem 180 MG 24 hr capsule  Commonly known as:  CARDIZEM CD  Take 180 mg by mouth at bedtime.     Fish Oil 1200 MG Caps  Take 1,200 mg by mouth every morning.     Fluticasone-Salmeterol 250-50 MCG/DOSE Aepb  Commonly known as:  ADVAIR  Inhale 1 puff into the lungs 2 (two) times daily.     furosemide 40 MG tablet  Commonly known as:  LASIX  Take 20-40 mg by mouth 2 (two) times daily. 40 mg in the morning and 20 mg in the evening     ipratropium-albuterol 0.5-2.5 (3) MG/3ML Soln  Commonly known as:  DUONEB  Take 3 mLs by nebulization 4 (four) times daily.     levofloxacin 500 MG tablet  Commonly known as:  LEVAQUIN  Take 1 tablet (500 mg total) by mouth daily. Continue taking for 5 more days post discharge,. Stop date July 14th, 2015.     pantoprazole 40 MG tablet  Commonly known as:  PROTONIX  Take 1 tablet (40 mg total) by mouth daily.     predniSONE 50 MG tablet  Commonly known as:  DELTASONE  Take 50 mg tablet in the morning July 10th, 2015 and continue to taper down by 10 mg daily until completed     psyllium 58.6 % packet  Commonly known as:  METAMUCIL  Take 1 packet by mouth daily.     simvastatin 40 MG tablet  Commonly known as:  ZOCOR  Take 40 mg by mouth at bedtime.     tiotropium 18 MCG inhalation capsule  Commonly known as:  SPIRIVA  Place 18 mcg into inhaler and inhale daily.     TYLENOL ARTHRITIS PAIN  650 MG CR tablet  Generic drug:  acetaminophen  Take 650 mg by mouth every morning.           Follow-up Information   Schedule an appointment as soon as possible for a visit with Penni Homans, MD.   Specialty:  Family Medicine   Contact information:   [address] [place] [address] [phone]       Follow up with Faye Ramsay, MD. (As needed, If symptoms worsen)    Specialty:  Internal  Medicine   Contact information:   [place]  [address] [phone]        The results of significant diagnostics from this hospitalization (including imaging, microbiology, ancillary and laboratory) are listed below for reference.     Microbiology: Recent Results (from the past 240 hour(s))  CULTURE, BLOOD (ROUTINE X 2)     Status: None   Collection Time    03/30/14 11:35 AM      Result Value Ref Range Status   Specimen Description BLOOD LEFT ANTECUBITAL   Final   Special Requests BOTTLES DRAWN AEROBIC ONLY 3CC   Final   Culture  Setup Time     Final   Value: 03/30/2014 18:30     Performed at [facility]   Culture     Final   Value: STAPHYLOCOCCUS SPECIES (COAGULASE NEGATIVE)     Note: THE SIGNIFICANCE OF ISOLATING THIS ORGANISM FROM A SINGLE SET OF BLOOD CULTURES WHEN MULTIPLE SETS ARE DRAWN IS UNCERTAIN. PLEASE NOTIFY THE MICROBIOLOGY DEPARTMENT WITHIN ONE WEEK IF SPECIATION AND SENSITIVITIES ARE REQUIRED.     Note: Gram Stain Report Called to,Read Back By and Verified With: MIKE MORGAN 03/31/14 @ 7:31PM BY RUSCOE A.     Performed at [facility]   Report Status 04/02/2014 FINAL   Final  CULTURE, BLOOD (ROUTINE X 2)     Status: None   Collection Time    03/30/14 11:36 AM      Result Value Ref Range Status   Specimen Description BLOOD RIGHT ANTECUBITAL   Final   Special Requests BOTTLES DRAWN AEROBIC AND ANAEROBIC [facility] EACH   Final   Culture  Setup Time     Final   Value: 03/30/2014 18:30     Performed at [facility]   Culture     Final   Value:        BLOOD CULTURE RECEIVED NO GROWTH TO DATE CULTURE WILL BE HELD FOR 5 DAYS BEFORE ISSUING A FINAL NEGATIVE REPORT     Performed at [facility]   Report Status PENDING   Incomplete  MRSA PCR SCREENING     Status: None   Collection Time    03/30/14  3:38 PM      Result Value Ref Range Status   MRSA by PCR NEGATIVE  NEGATIVE Final   Comment:            The GeneXpert MRSA Assay (FDA     approved for NASAL specimens     only), is one component of a     comprehensive MRSA colonization     surveillance program. It is not     intended to diagnose MRSA     infection nor to guide or     monitor treatment for     MRSA infections.  CULTURE, EXPECTORATED SPUTUM-ASSESSMENT     Status: None   Collection Time    03/31/14  7:00 AM      Result Value Ref Range Status   Specimen Description SPUTUM   Final   Special Requests Normal   Final   Sputum evaluation     Final   Value: MICROSCOPIC FINDINGS SUGGEST THAT THIS SPECIMEN IS NOT REPRESENTATIVE OF LOWER RESPIRATORY SECRETIONS. PLEASE RECOLLECT.     CALLED TO DENISE AT [phone] ON [id number] BY HOOKER,B   Report Status 03/31/2014 FINAL   Final     Labs: Basic Metabolic Panel:  Recent Labs Lab 03/30/14 1122 03/31/14 0403 04/04/14  0025  NA 143 140 139  K 4.7 4.8 3.9  CL 101 100 97  CO2 31 27 29   GLUCOSE 125* 167* 162*  BUN 26* 26* 41*  CREATININE 1.33 1.27 1.28  CALCIUM 8.8 8.3* 8.4   CBC:  Recent Labs Lab 03/30/14 1122 03/31/14 0403 04/04/14 0025  WBC 8.1 7.1 12.6*  NEUTROABS 6.1  --   --   HGB 12.2* 11.0* 12.2*  HCT 39.6 35.6* 37.9*  MCV 90.4 89.9 86.7  PLT 297 284 416*   Cardiac Enzymes:  Recent Labs Lab 03/30/14 1122  TROPONINI <0.30   BNP: BNP (last 3 results)  Recent Labs  03/30/14 1125  PROBNP 686.1*   CBG: No results found for this basename: GLUCAP,  in the last 168 hours   SIGNED: Time coordinating discharge: Over 30 minutes  Faye Ramsay,  MD  [facility] 04/04/2014, 11:36 AM Pager [phone]  If 7PM-7AM, please contact night-coverage [email or web address] Password [email or web address]

## 2014-04-04 NOTE — Discharge Instructions (Signed)
Chronic Obstructive Pulmonary Disease Chronic obstructive pulmonary disease (COPD) is a common lung condition in which airflow from the lungs is limited. COPD is a general term that can be used to describe many different lung problems that limit airflow, including both chronic bronchitis and emphysema. If you have COPD, your lung function will probably never return to normal, but there are measures you can take to improve lung function and make yourself feel better.  CAUSES   Smoking (common).   Exposure to secondhand smoke.   Genetic problems.  Chronic inflammatory lung diseases or recurrent infections. SYMPTOMS   Shortness of breath, especially with physical activity.   Deep, persistent (chronic) cough with a large amount of thick mucus.   Wheezing.   Rapid breaths (tachypnea).   Gray or bluish discoloration (cyanosis) of the skin, especially in fingers, toes, or lips.   Fatigue.   Weight loss.   Frequent infections or episodes when breathing symptoms become much worse (exacerbations).   Chest tightness. DIAGNOSIS  Your healthcare provider will take a medical history and perform a physical examination to make the initial diagnosis. Additional tests for COPD may include:   Lung (pulmonary) function tests.  Chest X-ray.  CT scan.  Blood tests. TREATMENT  Treatment available to help you feel better when you have COPD include:   Inhaler and nebulizer medicines. These help manage the symptoms of COPD and make your breathing more comfortable  Supplemental oxygen. Supplemental oxygen is only helpful if you have a low oxygen level in your blood.   Exercise and physical activity. These are beneficial for nearly all people with COPD. Some people may also benefit from a pulmonary rehabilitation program. HOME CARE INSTRUCTIONS   Take all medicines (inhaled or pills) as directed by your health care provider.  Only take over-the-counter or prescription medicines  for pain, fever, or discomfort as directed by your health care provider.   Avoid over-the-counter medicines or cough syrups that dry up your airway (such as antihistamines) and slow down the elimination of secretions unless instructed otherwise by your healthcare provider.   If you are a smoker, the most important thing that you can do is stop smoking. Continuing to smoke will cause further lung damage and breathing trouble. Ask your health care provider for help with quitting smoking. He or she can direct you to community resources or hospitals that provide support.  Avoid exposure to irritants such as smoke, chemicals, and fumes that aggravate your breathing.  Use oxygen therapy and pulmonary rehabilitation if directed by your health care provider. If you require home oxygen therapy, ask your healthcare provider whether you should purchase a pulse oximeter to measure your oxygen level at home.   Avoid contact with individuals who have a contagious illness.  Avoid extreme temperature and humidity changes.  Eat healthy foods. Eating smaller, more frequent meals and resting before meals may help you maintain your strength.  Stay active, but balance activity with periods of rest. Exercise and physical activity will help you maintain your ability to do things you want to do.  Preventing infection and hospitalization is very important when you have COPD. Make sure to receive all the vaccines your health care provider recommends, especially the pneumococcal and influenza vaccines. Ask your healthcare provider whether you need a pneumonia vaccine.  Learn and use relaxation techniques to manage stress.  Learn and use controlled breathing techniques as directed by your health care provider. Controlled breathing techniques include:   Pursed lip breathing. Start by breathing   in (inhaling) through your nose for 1 second. Then, purse your lips as if you were going to whistle and breathe out (exhale)  through the pursed lips for 2 seconds.   Diaphragmatic breathing. Start by putting one hand on your abdomen just above your waist. Inhale slowly through your nose. The hand on your abdomen should move out. Then purse your lips and exhale slowly. You should be able to feel the hand on your abdomen moving in as you exhale.   Learn and use controlled coughing to clear mucus from your lungs. Controlled coughing is a series of short, progressive coughs. The steps of controlled coughing are:  1. Lean your head slightly forward.  2. Breathe in deeply using diaphragmatic breathing.  3. Try to hold your breath for 3 seconds.  4. Keep your mouth slightly open while coughing twice.  5. Spit any mucus out into a tissue.  6. Rest and repeat the steps once or twice as needed. SEEK MEDICAL CARE IF:   You are coughing up more mucus than usual.   There is a change in the color or thickness of your mucus.   Your breathing is more labored than usual.   Your breathing is faster than usual.  SEEK IMMEDIATE MEDICAL CARE IF:   You have shortness of breath while you are resting.   You have shortness of breath that prevents you from:  Being able to talk.   Performing your usual physical activities.   You have chest pain lasting longer than 5 minutes.   Your skin color is more cyanotic than usual.  You measure low oxygen saturations for longer than 5 minutes with a pulse oximeter. MAKE SURE YOU:   Understand these instructions.  Will watch your condition.  Will get help right away if you are not doing well or get worse. Document Released: 06/23/2005 Document Revised: 07/04/2013 Document Reviewed: 05/10/2013 ExitCare Patient Information 2015 [employer]. This information is not intended to replace advice given to you by your health care provider. Make sure you discuss any questions you have with your health care provider.  

## 2014-04-05 LAB — CULTURE, BLOOD (ROUTINE X 2): CULTURE: NO GROWTH

## 2014-04-09 NOTE — Telephone Encounter (Signed)
I have faxed PA. Will await approval/denial

## 2014-04-09 NOTE — Telephone Encounter (Signed)
Pl have any MD in offcie sign

## 2014-04-10 MED ORDER — ALBUTEROL SULFATE (2.5 MG/3ML) 0.083% IN NEBU: 2.5000 mg | INHALATION_SOLUTION | Freq: Four times a day (QID) | RESPIRATORY_TRACT | Status: AC | PRN

## 2014-04-10 NOTE — Telephone Encounter (Signed)
Received denial under medicare part D.  Was advised to try under part B. I called [facility]. The pharm was busy so I had to leave a message. I did submit new RX with dx code.  Will await call back

## 2014-04-11 NOTE — Telephone Encounter (Signed)
Called spoke with pharm from [facility]. Was advised pt is on a restricted drug plan and only allow him to get the albuterol neb at certain place He will have to call his plan to see where he can get this at. LMTCB x1 for pt

## 2014-04-11 NOTE — Telephone Encounter (Signed)
Spoke with the pt and notified of recs per [place]  He verbalized understanding and denied any questions

## 2014-04-14 NOTE — Consult Note (Signed)
I have reviewed and discussed the care of this patient in detail with the nurse practitioner including pertinent patient records, physical exam findings and data. I agree with details of this encounter.  

## 2014-04-16 ENCOUNTER — Telehealth: Payer: Self-pay | Admitting: Pulmonary Disease

## 2014-04-16 NOTE — Telephone Encounter (Signed)
Called pt. He is currently in [facility] and not sure when he will get out. He cancelled his appt Thursday with RA. He will call me back when he is able to r/s this appt. Nothing further needed

## 2014-04-17 ENCOUNTER — Telehealth: Payer: Self-pay | Admitting: *Deleted

## 2014-04-17 ENCOUNTER — Telehealth: Payer: Self-pay | Admitting: Pulmonary Disease

## 2014-04-17 NOTE — Telephone Encounter (Signed)
FYI: Pt Canceled Appt for 07.30.15 at 9:15am, scheduler has been made aware Confidential Office Message [address] [place], [place] [address] p. [phone] f. [phone] To: Jarome Lamas (After Hours Triage) Fax: [phone] From: [facility] Date/ Time: 04/17/2014 8:16 AM Taken By: Jeb Levering, CSR Caller: [place]: not collected Patient: [name], Tracy DOB: 02/21/1926 Phone: [phone] Reason for Call: See info below Regarding Appointment: Yes Appt Date: Appt Time: Unknown Provider: Penni Homans (Family Practice) Reason: Cancel Appointment Details: not sure of the date or time just that it with Dr. Charlett Blake Outcome: Cancelled appointment in [place] [facility]) Confidential

## 2014-04-17 NOTE — Telephone Encounter (Signed)
appt has been made. Will sign off

## 2014-04-17 NOTE — Telephone Encounter (Signed)
p. [phone] f. [phone] To: Jarome Lamas (After Hours Triage) Fax: [phone] From: [facility] Date/ Time: 04/17/2014 8:16 AM Taken By: Jeb Levering, CSR Caller: [place]: not collected Patient: Tracy [name] DOB: 08/29/1926 Phone: [phone] Reason for Call: See info below Regarding Appointment: Yes Appt Date: Appt Time: Unknown Provider: Penni Homans (Family Practice) Reason: Cancel Appointment Details: not sure of the date or time just that it with Dr. Charlett Blake Outcome: Cancelled appointment in [facility])

## 2014-04-18 ENCOUNTER — Ambulatory Visit: Payer: Medicare Other | Admitting: Pulmonary Disease

## 2014-04-18 ENCOUNTER — Ambulatory Visit (INDEPENDENT_AMBULATORY_CARE_PROVIDER_SITE_OTHER): Payer: Medicare Other | Admitting: Pulmonary Disease

## 2014-04-18 ENCOUNTER — Encounter: Payer: Self-pay | Admitting: Pulmonary Disease

## 2014-04-18 VITALS — BP 134/72 | HR 82 | Ht 70.0 in | Wt 206.0 lb

## 2014-04-18 DIAGNOSIS — Z515 Encounter for palliative care: Secondary | ICD-10-CM

## 2014-04-18 DIAGNOSIS — J9601 Acute respiratory failure with hypoxia: Secondary | ICD-10-CM

## 2014-04-18 DIAGNOSIS — J9600 Acute respiratory failure, unspecified whether with hypoxia or hypercapnia: Secondary | ICD-10-CM

## 2014-04-18 DIAGNOSIS — J440 Chronic obstructive pulmonary disease with acute lower respiratory infection: Secondary | ICD-10-CM

## 2014-04-18 DIAGNOSIS — J209 Acute bronchitis, unspecified: Secondary | ICD-10-CM

## 2014-04-18 NOTE — Assessment & Plan Note (Signed)
Unclear what made hypoxia worse, he is back to baseline now prob sequelae of pna, VQ mismatch

## 2014-04-18 NOTE — Assessment & Plan Note (Signed)
Oxygen satn 88% acceptable OK to start PT  Stay on advair/ spiriva  Call as needed

## 2014-04-18 NOTE — Patient Instructions (Addendum)
Oxygen satn 88% acceptable OK to start PT  Stay on advair/ spiriva  Call as needed

## 2014-04-18 NOTE — Progress Notes (Signed)
   Subjective:    Patient ID: NEHEMIAH MCFARREN, male    DOB: [birth year], 80 y.o.   MRN: [id number]  HPI  PCP- [place]  80 y/o [facility] for FU of OSA; COPD, severe hypoxia  H/o sub segmental PE/ RLE DVT in dec 13  Sees Dr. Marin Olp for polycythemia with phlebotomy.  Significant tests/ events  OBSTRUCTIVE SLEEP APNEA - ~ sleep study 3/06 w/ RDI 26 w/ desat to 76%...  COPD - on home O2 .  ~ PFT 11/04 showed FVC=4.51 (103%), FEV1=1.93 (67%) and DLCO~50%...  ~ PFT 4/10 showed FVC= 3.00 (77%), FEV1= 1.37 (45%)  PFTs -12/13- fev1 52% (1.30 ) -unchanged from last  DLCO 29%  ~ CT Chest 9/05 & CT abd 1/12 showed marked emphysema w/ blebs, & thor spondylosis...  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 Karsten Ro.    04/18/2014  Adm 6/2- 03/02/14, CXR neg but CT s/o LLL consolidation  Influenza negative, urine strep antigen remained negative . MAT vs A fibn -CT angio neg PE, duplex neg DVT  required transient bipap  change beta blocker to Cardizem due to his severe emphysema and COPD.  2-D echo showed EF of 73-41%, grade 1 diastolic dysfunction mild to moderate aortic stenosis.    Adm 7/4- 04/04/14 with hypoxic resp failure, seen by palliative care, dc'd to rehab  Chief Complaint  Patient presents with  . Follow-up    Pt states his 02 concentrators are a great help to his 02 sats.  States he is much improved since his last visit.  C/o occasional cough with brown-beige mucus.     Remarkably im[porved, back to 8L [place] In wheelchair  today Remains on lasix 40mg  in am and 20mg  in pm , wt stable No orthopnea or chest pain.  No fever, hemoptysis or n/v/d  Good appetite.  O2 demands -back to baseline -. 5-6 L at rest and 8-10 walking.       Review of Systems neg for any significant sore throat, dysphagia, itching, sneezing, nasal congestion or excess/ purulent secretions, fever, chills, sweats, unintended wt loss, pleuritic or exertional cp, hempoptysis, orthopnea pnd or change in chronic leg swelling. Also denies presyncope, palpitations, heartburn, abdominal pain, nausea, vomiting, diarrhea or change in bowel or urinary habits, dysuria,hematuria, rash, arthralgias, visual complaints, headache, numbness weakness or ataxia.     Objective:   Physical Exam  Gen. Pleasant, well-nourished,elderly, in no distress ENT - no lesions, no post nasal drip Neck: No JVD, no thyromegaly, no carotid bruits Lungs: no use of accessory muscles, no dullness to percussion, decreased without rales or rhonchi  Cardiovascular: Rhythm regular, heart sounds  normal, no murmurs or gallops, no peripheral edema Musculoskeletal: No deformities, no cyanosis or clubbing         Assessment & Plan:

## 2014-04-18 NOTE — Assessment & Plan Note (Signed)
Ensure DNR signed - MOST form- at rehab facility

## 2014-04-22 ENCOUNTER — Ambulatory Visit: Payer: Medicare Other | Admitting: Cardiology

## 2014-04-25 ENCOUNTER — Ambulatory Visit: Payer: Medicare Other | Admitting: Family Medicine

## 2014-04-29 ENCOUNTER — Telehealth: Payer: Self-pay | Admitting: Hematology & Oncology

## 2014-04-29 NOTE — Telephone Encounter (Signed)
Pt cx 8-10 said he was at end of lung thing. RN aware

## 2014-05-06 ENCOUNTER — Ambulatory Visit: Payer: Medicare Other | Admitting: Hematology & Oncology

## 2014-05-06 ENCOUNTER — Other Ambulatory Visit: Payer: Medicare Other | Admitting: Lab

## 2014-05-23 ENCOUNTER — Ambulatory Visit: Payer: Medicare Other | Admitting: Cardiology

## 2014-05-28 DEATH — deceased

## 2014-07-12 ENCOUNTER — Other Ambulatory Visit: Payer: Self-pay

## 2014-10-05 IMAGING — CT CT CHEST W/O CM
2 of 3 series · 15 of 36 positions shown, 18 images · non-contrast
Comparison: 07/19/2012

CLINICAL DATA: Follow-up right middle lobe pulmonary nodule

CT CHEST WITHOUT CONTRAST
TECHNIQUE: Multidetector CT imaging of the chest was performed
following the standard protocol without IV contrast.

[Series 2: chest 5.0 b31f · axial · 0.76mm/px · z∈[-300,-46]mm · 12 of 61 slices shown, 15 images]
[im 5/61  mediastinal]
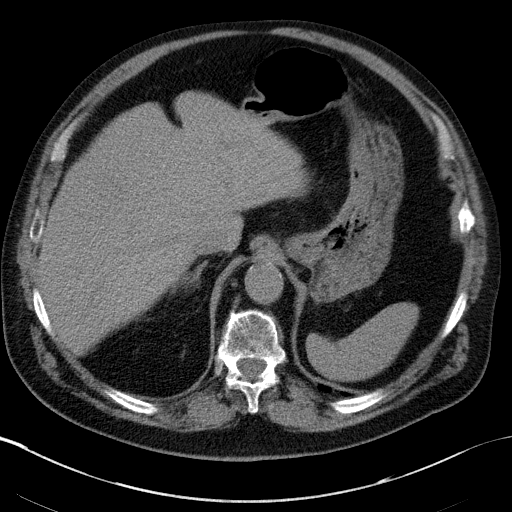
[im 5/61  lung]
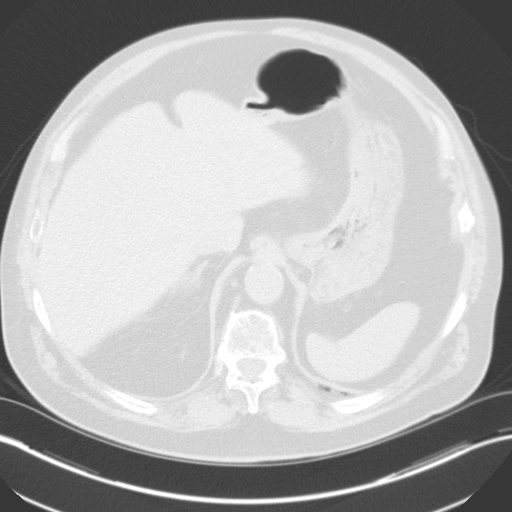
[im 9/61  lung]
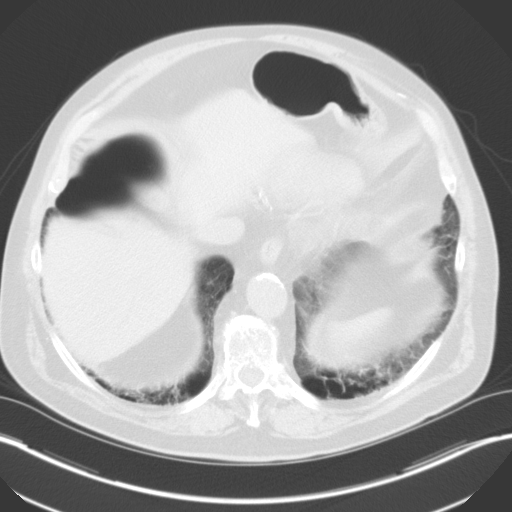
[im 14/61  lung]
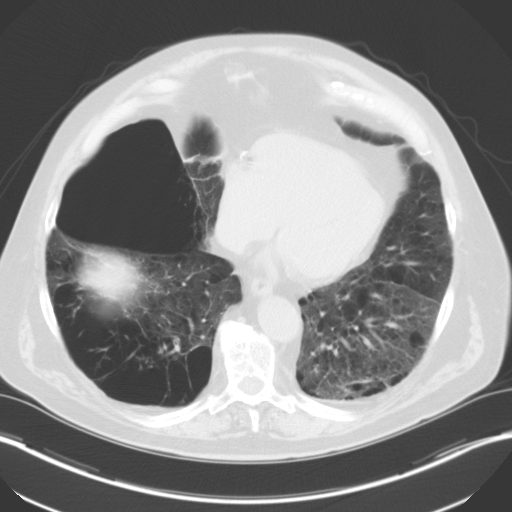
[im 18/61  lung]
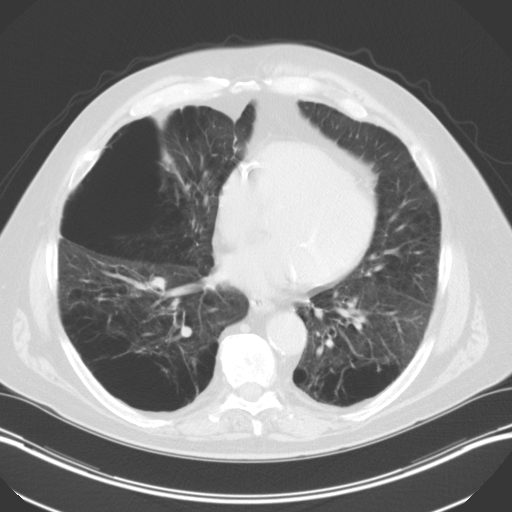
[im 23/61  mediastinal]
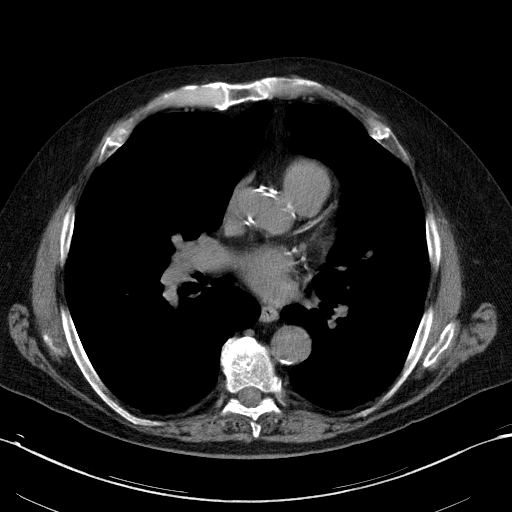
[im 23/61  lung]
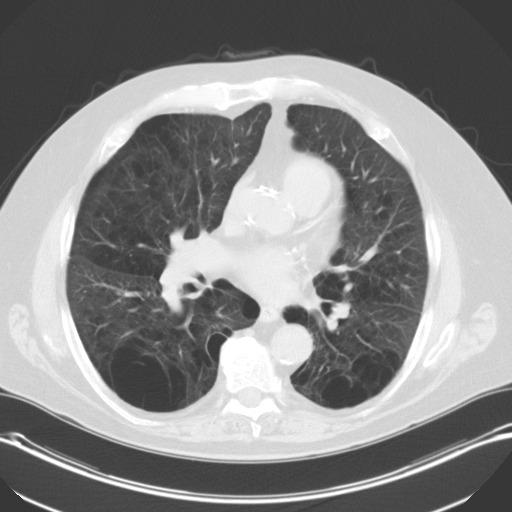
[im 27/61  lung]
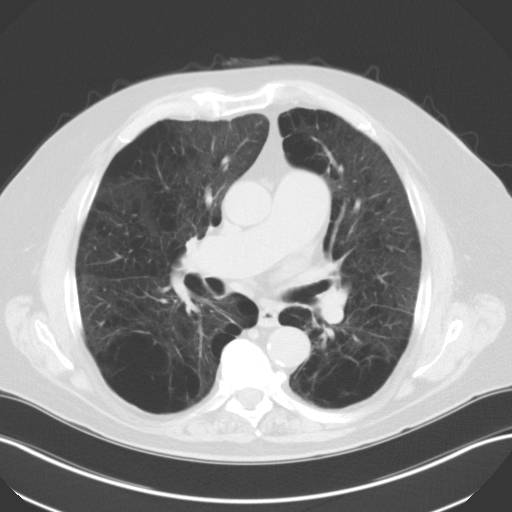
[im 34/61  lung]
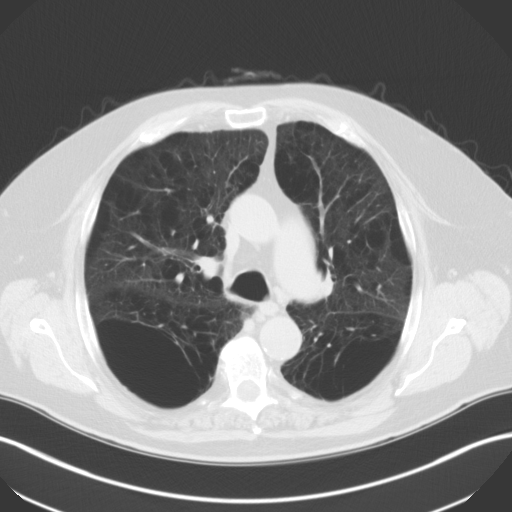
[im 38/61  lung]
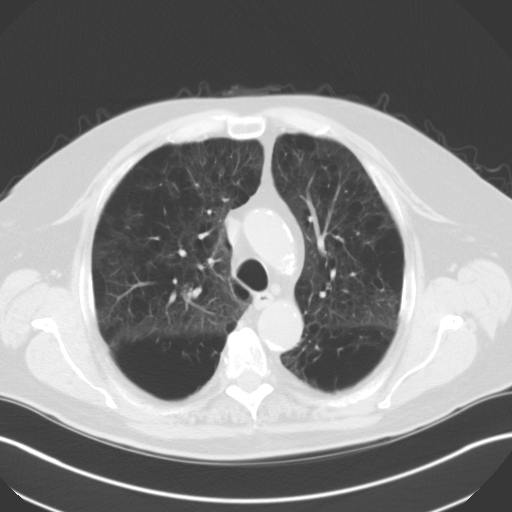
[im 43/61  mediastinal]
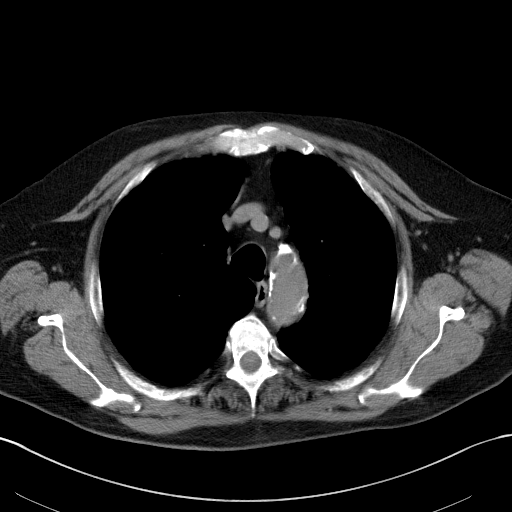
[im 43/61  lung]
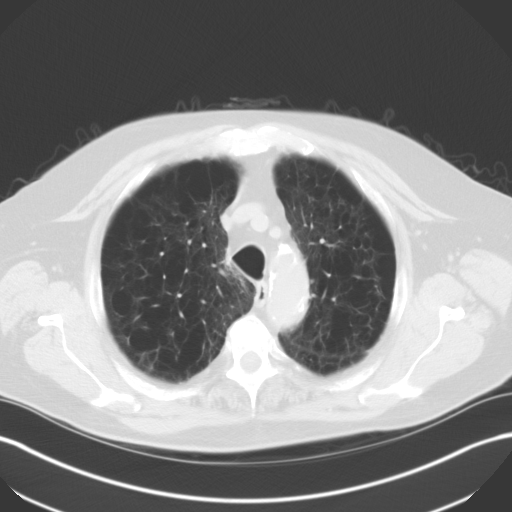
[im 47/61  lung]
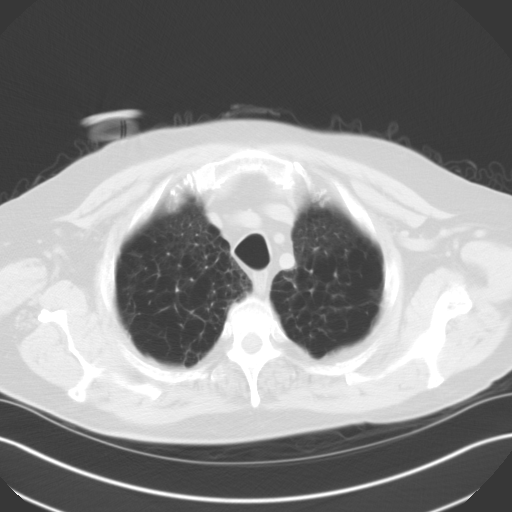
[im 52/61  lung]
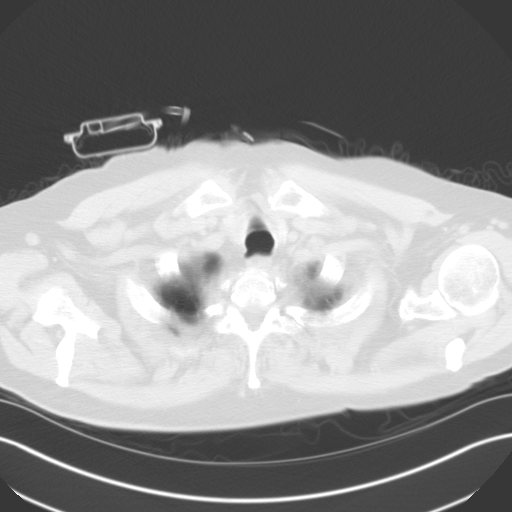
[im 56/61  lung]
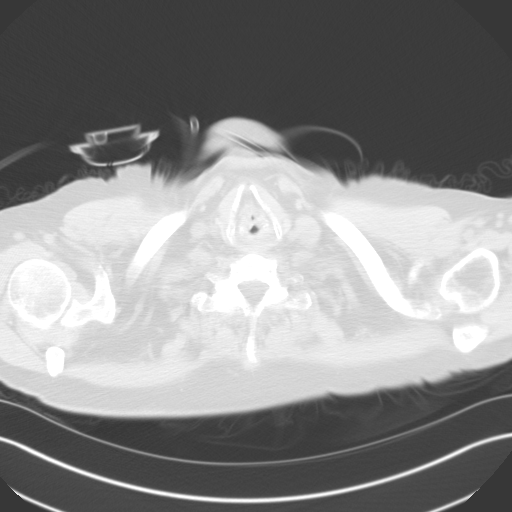

[Series 6: chest 3.0 coronal · coronal · 0.61mm/px · 3 of 107 slices shown]
[im 22/107  lung]
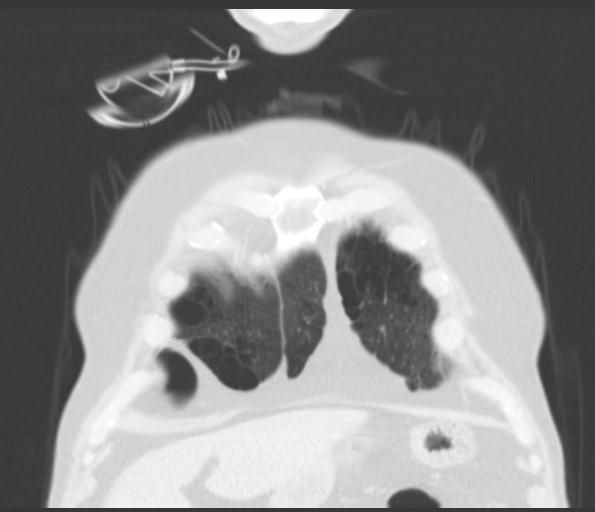
[im 43/107  lung]
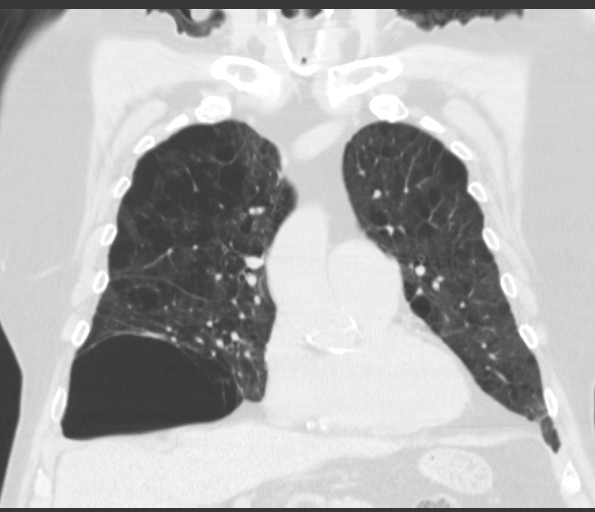
[im 64/107  lung]
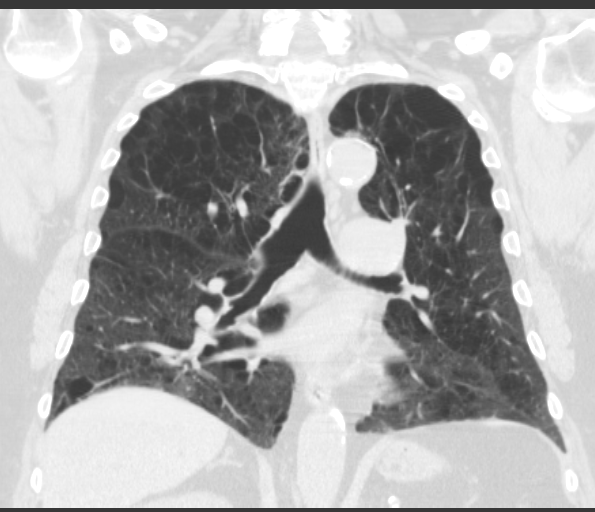

[15 of 36 positions shown; findings below may reference images not displayed]

FINDINGS: Severe emphysematous changes are re-identified.  Large
bullae, especially in the right middle lobe, are re-identified.
The previously seen 7 mm right middle lobe nodule measures slightly
smaller at 5 mm and appears less mass-like than previously.  No new
pulmonary nodule, consolidation, or mass is identified.  No
pneumothorax.  No pleural or pericardial effusion.

T7 compression deformity again noted.  The heart size is normal.
Extensive atheromatous aortic and coronary arterial calcifications
are re-identified.  No lymphadenopathy.
IMPRESSION: Slight decrease in size and density of right middle lobe nodule,
which favors a benign process rather than malignancy, although
malignancy can rarely be seen to decrease in size.  Consider follow-
up chest CT 6 months for surveillance.

Severe emphysematous changes.

## 2015-03-24 ENCOUNTER — Other Ambulatory Visit: Payer: Self-pay
